# Patient Record
Sex: Male | Born: 1940
Health system: Southern US, Community
[De-identification: ages and names within clinical notes are randomized; demographics above are authoritative.]

## PROBLEM LIST (undated history)

## (undated) DIAGNOSIS — Z973 Presence of spectacles and contact lenses: Secondary | ICD-10-CM

## (undated) DIAGNOSIS — IMO0001 Reserved for inherently not codable concepts without codable children: Secondary | ICD-10-CM

## (undated) DIAGNOSIS — E785 Hyperlipidemia, unspecified: Secondary | ICD-10-CM

## (undated) DIAGNOSIS — Z972 Presence of dental prosthetic device (complete) (partial): Secondary | ICD-10-CM

## (undated) DIAGNOSIS — N35919 Unspecified urethral stricture, male, unspecified site: Secondary | ICD-10-CM

## (undated) DIAGNOSIS — K219 Gastro-esophageal reflux disease without esophagitis: Secondary | ICD-10-CM

## (undated) DIAGNOSIS — I4510 Unspecified right bundle-branch block: Secondary | ICD-10-CM

## (undated) DIAGNOSIS — R399 Unspecified symptoms and signs involving the genitourinary system: Secondary | ICD-10-CM

## (undated) DIAGNOSIS — I252 Old myocardial infarction: Secondary | ICD-10-CM

## (undated) DIAGNOSIS — Z951 Presence of aortocoronary bypass graft: Secondary | ICD-10-CM

## (undated) DIAGNOSIS — N400 Enlarged prostate without lower urinary tract symptoms: Secondary | ICD-10-CM

## (undated) DIAGNOSIS — I259 Chronic ischemic heart disease, unspecified: Secondary | ICD-10-CM

## (undated) DIAGNOSIS — I10 Essential (primary) hypertension: Secondary | ICD-10-CM

## (undated) DIAGNOSIS — Z8679 Personal history of other diseases of the circulatory system: Secondary | ICD-10-CM

## (undated) DIAGNOSIS — I2510 Atherosclerotic heart disease of native coronary artery without angina pectoris: Secondary | ICD-10-CM

## (undated) DIAGNOSIS — E039 Hypothyroidism, unspecified: Secondary | ICD-10-CM

## (undated) HISTORY — DX: Atherosclerotic heart disease of native coronary artery without angina pectoris: I25.10

## (undated) HISTORY — DX: Hypothyroidism, unspecified: E03.9

---

## 2012-06-19 HISTORY — PX: TRANSURETHRAL RESECTION OF PROSTATE: SHX73

## 2012-08-15 DIAGNOSIS — R7989 Other specified abnormal findings of blood chemistry: Secondary | ICD-10-CM

## 2012-08-15 DIAGNOSIS — I509 Heart failure, unspecified: Secondary | ICD-10-CM

## 2012-08-15 DIAGNOSIS — I214 Non-ST elevation (NSTEMI) myocardial infarction: Secondary | ICD-10-CM

## 2012-08-15 DIAGNOSIS — I219 Acute myocardial infarction, unspecified: Secondary | ICD-10-CM

## 2012-08-16 ENCOUNTER — Other Ambulatory Visit: Payer: Self-pay

## 2012-08-16 ENCOUNTER — Other Ambulatory Visit: Payer: Self-pay | Admitting: Physician Assistant

## 2012-08-16 ENCOUNTER — Inpatient Hospital Stay (HOSPITAL_COMMUNITY)
Admission: AD | Admit: 2012-08-16 | Discharge: 2012-08-25 | DRG: 234 | Disposition: A | Discharge status: Skilled Nursing Facility | Payer: Medicare PPO | Source: Other Acute Inpatient Hospital | Attending: Thoracic Surgery (Cardiothoracic Vascular Surgery) | Admitting: Thoracic Surgery (Cardiothoracic Vascular Surgery)

## 2012-08-16 ENCOUNTER — Inpatient Hospital Stay (HOSPITAL_COMMUNITY): Payer: Medicare PPO

## 2012-08-16 ENCOUNTER — Encounter (HOSPITAL_COMMUNITY): Payer: Self-pay

## 2012-08-16 ENCOUNTER — Encounter (HOSPITAL_COMMUNITY)
Admission: AD | Disposition: A | Discharge status: Skilled Nursing Facility | Payer: Self-pay | Source: Other Acute Inpatient Hospital | Attending: Thoracic Surgery (Cardiothoracic Vascular Surgery)

## 2012-08-16 DIAGNOSIS — I10 Essential (primary) hypertension: Secondary | ICD-10-CM | POA: Diagnosis present

## 2012-08-16 DIAGNOSIS — E8779 Other fluid overload: Secondary | ICD-10-CM | POA: Diagnosis not present

## 2012-08-16 DIAGNOSIS — I509 Heart failure, unspecified: Secondary | ICD-10-CM

## 2012-08-16 DIAGNOSIS — D62 Acute posthemorrhagic anemia: Secondary | ICD-10-CM | POA: Diagnosis not present

## 2012-08-16 DIAGNOSIS — I214 Non-ST elevation (NSTEMI) myocardial infarction: Secondary | ICD-10-CM

## 2012-08-16 DIAGNOSIS — R066 Hiccough: Secondary | ICD-10-CM | POA: Diagnosis not present

## 2012-08-16 DIAGNOSIS — I519 Heart disease, unspecified: Secondary | ICD-10-CM | POA: Diagnosis not present

## 2012-08-16 DIAGNOSIS — I4891 Unspecified atrial fibrillation: Secondary | ICD-10-CM | POA: Diagnosis not present

## 2012-08-16 DIAGNOSIS — Y832 Surgical operation with anastomosis, bypass or graft as the cause of abnormal reaction of the patient, or of later complication, without mention of misadventure at the time of the procedure: Secondary | ICD-10-CM | POA: Diagnosis not present

## 2012-08-16 DIAGNOSIS — I2510 Atherosclerotic heart disease of native coronary artery without angina pectoris: Secondary | ICD-10-CM | POA: Diagnosis present

## 2012-08-16 DIAGNOSIS — I9589 Other hypotension: Secondary | ICD-10-CM | POA: Diagnosis not present

## 2012-08-16 DIAGNOSIS — I498 Other specified cardiac arrhythmias: Secondary | ICD-10-CM | POA: Diagnosis not present

## 2012-08-16 DIAGNOSIS — Z87891 Personal history of nicotine dependence: Secondary | ICD-10-CM

## 2012-08-16 DIAGNOSIS — I25810 Atherosclerosis of coronary artery bypass graft(s) without angina pectoris: Secondary | ICD-10-CM | POA: Diagnosis present

## 2012-08-16 DIAGNOSIS — E785 Hyperlipidemia, unspecified: Secondary | ICD-10-CM | POA: Diagnosis present

## 2012-08-16 DIAGNOSIS — N320 Bladder-neck obstruction: Secondary | ICD-10-CM | POA: Diagnosis present

## 2012-08-16 DIAGNOSIS — N400 Enlarged prostate without lower urinary tract symptoms: Secondary | ICD-10-CM | POA: Diagnosis present

## 2012-08-16 DIAGNOSIS — N35919 Unspecified urethral stricture, male, unspecified site: Secondary | ICD-10-CM | POA: Diagnosis present

## 2012-08-16 HISTORY — DX: Benign prostatic hyperplasia without lower urinary tract symptoms: N40.0

## 2012-08-16 HISTORY — PX: LEFT HEART CATHETERIZATION WITH CORONARY ANGIOGRAM: SHX5451

## 2012-08-16 HISTORY — DX: Hyperlipidemia, unspecified: E78.5

## 2012-08-16 HISTORY — DX: Essential (primary) hypertension: I10

## 2012-08-16 LAB — SURGICAL PCR SCREEN
MRSA, PCR: NEGATIVE
Staphylococcus aureus: NEGATIVE

## 2012-08-16 LAB — HEPARIN LEVEL (UNFRACTIONATED): Heparin Unfractionated: 0.75 IU/mL — ABNORMAL HIGH (ref 0.30–0.70)

## 2012-08-16 LAB — TYPE AND SCREEN
ABO/RH(D): B POS
Antibody Screen: NEGATIVE

## 2012-08-16 SURGERY — LEFT HEART CATHETERIZATION WITH CORONARY ANGIOGRAM
Anesthesia: LOCAL

## 2012-08-16 MED ORDER — NITROGLYCERIN IN D5W 200-5 MCG/ML-% IV SOLN
2.0000 ug/min | INTRAVENOUS | Status: DC
  Filled 2012-08-16: qty 250

## 2012-08-16 MED ORDER — DEXMEDETOMIDINE HCL IN NACL 400 MCG/100ML IV SOLN
0.1000 ug/kg/h | INTRAVENOUS | Status: AC
  Administered 2012-08-17: 0.3 ug/kg/h via INTRAVENOUS
  Filled 2012-08-16: qty 100

## 2012-08-16 MED ORDER — MAGNESIUM SULFATE 50 % IJ SOLN
40.0000 meq | INTRAMUSCULAR | Status: DC
  Filled 2012-08-16: qty 10

## 2012-08-16 MED ORDER — SODIUM CHLORIDE 0.9 % IJ SOLN: 3.0000 mL | INTRAMUSCULAR | Status: DC | PRN

## 2012-08-16 MED ORDER — DOPAMINE-DEXTROSE 3.2-5 MG/ML-% IV SOLN
2.0000 ug/kg/min | INTRAVENOUS | Status: AC
  Administered 2012-08-17: 3 ug/kg/min via INTRAVENOUS
  Filled 2012-08-16: qty 250

## 2012-08-16 MED ORDER — TAMSULOSIN HCL 0.4 MG PO CAPS: 0.4000 mg | ORAL_CAPSULE | Freq: Every morning | ORAL | Status: DC

## 2012-08-16 MED ORDER — ATORVASTATIN CALCIUM 80 MG PO TABS
80.0000 mg | ORAL_TABLET | Freq: Every day | ORAL | Status: DC
  Administered 2012-08-16: 80 mg via ORAL
  Filled 2012-08-16 (×3): qty 1

## 2012-08-16 MED ORDER — LIDOCAINE HCL (PF) 1 % IJ SOLN
INTRAMUSCULAR | Status: AC
  Filled 2012-08-16: qty 30

## 2012-08-16 MED ORDER — NITROGLYCERIN IN D5W 200-5 MCG/ML-% IV SOLN
INTRAVENOUS | Status: AC
  Filled 2012-08-16: qty 250

## 2012-08-16 MED ORDER — OXYCODONE-ACETAMINOPHEN 5-325 MG PO TABS: 1.0000 | ORAL_TABLET | ORAL | Status: DC | PRN

## 2012-08-16 MED ORDER — NEBIVOLOL HCL 5 MG PO TABS
5.0000 mg | ORAL_TABLET | Freq: Every day | ORAL | Status: DC
  Filled 2012-08-16: qty 1

## 2012-08-16 MED ORDER — ASPIRIN EC 81 MG PO TBEC
81.0000 mg | DELAYED_RELEASE_TABLET | Freq: Once | ORAL | Status: DC
  Filled 2012-08-16: qty 1

## 2012-08-16 MED ORDER — NITROGLYCERIN IN D5W 200-5 MCG/ML-% IV SOLN
2.0000 ug/min | INTRAVENOUS | Status: DC
  Administered 2012-08-16: 10 ug/min via INTRAVENOUS

## 2012-08-16 MED ORDER — MIDAZOLAM HCL 2 MG/2ML IJ SOLN
INTRAMUSCULAR | Status: AC
  Filled 2012-08-16: qty 2

## 2012-08-16 MED ORDER — PHENYLEPHRINE HCL 10 MG/ML IJ SOLN
30.0000 ug/min | INTRAVENOUS | Status: AC
  Administered 2012-08-17: 15 ug/min via INTRAVENOUS
  Filled 2012-08-16: qty 2

## 2012-08-16 MED ORDER — EPINEPHRINE HCL 1 MG/ML IJ SOLN
0.5000 ug/min | INTRAVENOUS | Status: DC
  Filled 2012-08-16: qty 4

## 2012-08-16 MED ORDER — HEPARIN SODIUM (PORCINE) 1000 UNIT/ML IJ SOLN
INTRAMUSCULAR | Status: AC
  Filled 2012-08-16: qty 1

## 2012-08-16 MED ORDER — SODIUM CHLORIDE 0.9 % IV SOLN: 250.0000 mL | INTRAVENOUS | Status: DC | PRN

## 2012-08-16 MED ORDER — VANCOMYCIN HCL 10 G IV SOLR
1250.0000 mg | INTRAVENOUS | Status: AC
  Administered 2012-08-17: 1250 mg via INTRAVENOUS
  Filled 2012-08-16: qty 1250

## 2012-08-16 MED ORDER — ASPIRIN 81 MG PO CHEW: 324.0000 mg | CHEWABLE_TABLET | ORAL | Status: DC

## 2012-08-16 MED ORDER — BISACODYL 5 MG PO TBEC: 5.0000 mg | DELAYED_RELEASE_TABLET | Freq: Once | ORAL | Status: DC

## 2012-08-16 MED ORDER — CHLORHEXIDINE GLUCONATE 4 % EX LIQD
60.0000 mL | Freq: Once | CUTANEOUS | Status: AC
  Administered 2012-08-16: 4 via TOPICAL
  Filled 2012-08-16: qty 60

## 2012-08-16 MED ORDER — SODIUM CHLORIDE 0.9 % IV SOLN: INTRAVENOUS | Status: DC

## 2012-08-16 MED ORDER — ONDANSETRON HCL 4 MG/2ML IJ SOLN: 4.0000 mg | Freq: Four times a day (QID) | INTRAMUSCULAR | Status: DC | PRN

## 2012-08-16 MED ORDER — SODIUM CHLORIDE 0.9 % IV SOLN
1.0000 mL/kg/h | INTRAVENOUS | Status: DC
  Administered 2012-08-17: 1 mL/kg/h via INTRAVENOUS

## 2012-08-16 MED ORDER — ACETAMINOPHEN 325 MG PO TABS: 650.0000 mg | ORAL_TABLET | ORAL | Status: DC | PRN

## 2012-08-16 MED ORDER — SODIUM CHLORIDE 0.9 % IJ SOLN: 3.0000 mL | Freq: Two times a day (BID) | INTRAMUSCULAR | Status: DC

## 2012-08-16 MED ORDER — DIAZEPAM 2 MG PO TABS
2.0000 mg | ORAL_TABLET | Freq: Once | ORAL | Status: AC
  Administered 2012-08-17: 2 mg via ORAL
  Filled 2012-08-16: qty 1

## 2012-08-16 MED ORDER — AMLODIPINE BESYLATE 5 MG PO TABS
5.0000 mg | ORAL_TABLET | Freq: Every day | ORAL | Status: DC
  Filled 2012-08-16: qty 1

## 2012-08-16 MED ORDER — DEXTROSE 5 % IV SOLN
750.0000 mg | INTRAVENOUS | Status: DC
  Filled 2012-08-16 (×2): qty 750

## 2012-08-16 MED ORDER — ACETAMINOPHEN 325 MG PO TABS
650.0000 mg | ORAL_TABLET | ORAL | Status: DC | PRN
Start: 2012-08-16 — End: 2012-08-17

## 2012-08-16 MED ORDER — ASPIRIN EC 81 MG PO TBEC
81.0000 mg | DELAYED_RELEASE_TABLET | Freq: Every day | ORAL | Status: DC
  Filled 2012-08-16: qty 1

## 2012-08-16 MED ORDER — ALPRAZOLAM 0.25 MG PO TABS: 0.2500 mg | ORAL_TABLET | ORAL | Status: DC | PRN

## 2012-08-16 MED ORDER — HEPARIN (PORCINE) IN NACL 100-0.45 UNIT/ML-% IJ SOLN: 850.0000 [IU]/h | INTRAMUSCULAR | Status: DC

## 2012-08-16 MED ORDER — VERAPAMIL HCL 2.5 MG/ML IV SOLN
INTRAVENOUS | Status: AC
  Filled 2012-08-16: qty 2

## 2012-08-16 MED ORDER — POTASSIUM CHLORIDE 2 MEQ/ML IV SOLN
80.0000 meq | INTRAVENOUS | Status: DC
  Filled 2012-08-16: qty 40

## 2012-08-16 MED ORDER — AMLODIPINE BESYLATE 10 MG PO TABS
10.0000 mg | ORAL_TABLET | Freq: Every day | ORAL | Status: DC
  Filled 2012-08-16: qty 1

## 2012-08-16 MED ORDER — NITROGLYCERIN 0.4 MG SL SUBL: 0.4000 mg | SUBLINGUAL_TABLET | SUBLINGUAL | Status: DC | PRN

## 2012-08-16 MED ORDER — SODIUM CHLORIDE 0.9 % IV SOLN
INTRAVENOUS | Status: AC
  Administered 2012-08-17: 1 [IU]/h via INTRAVENOUS
  Filled 2012-08-16: qty 1

## 2012-08-16 MED ORDER — PLASMA-LYTE 148 IV SOLN
INTRAVENOUS | Status: AC
  Administered 2012-08-17: 10:00:00
  Filled 2012-08-16: qty 2.5

## 2012-08-16 MED ORDER — DEXTROSE 5 % IV SOLN
1.5000 g | INTRAVENOUS | Status: AC
  Administered 2012-08-17: 1.5 g via INTRAVENOUS
  Administered 2012-08-17: .75 g via INTRAVENOUS
  Filled 2012-08-16: qty 1.5

## 2012-08-16 MED ORDER — SODIUM CHLORIDE 0.9 % IV SOLN
INTRAVENOUS | Status: AC
  Administered 2012-08-17: 69.8 mL/h via INTRAVENOUS
  Administered 2012-08-17: 12:00:00 via INTRAVENOUS
  Filled 2012-08-16: qty 40

## 2012-08-16 MED ORDER — FENTANYL CITRATE 0.05 MG/ML IJ SOLN
INTRAMUSCULAR | Status: AC
  Filled 2012-08-16: qty 2

## 2012-08-16 MED ORDER — ADULT MULTIVITAMIN W/MINERALS CH
1.0000 | ORAL_TABLET | Freq: Every day | ORAL | Status: DC
  Administered 2012-08-18 – 2012-08-23 (×6): 1 via ORAL
  Filled 2012-08-16 (×10): qty 1

## 2012-08-16 MED ORDER — TAMSULOSIN HCL 0.4 MG PO CAPS
0.4000 mg | ORAL_CAPSULE | Freq: Every day | ORAL | Status: DC
  Administered 2012-08-18 – 2012-08-25 (×8): 0.4 mg via ORAL
  Filled 2012-08-16 (×12): qty 1

## 2012-08-16 MED ORDER — HEPARIN (PORCINE) IN NACL 100-0.45 UNIT/ML-% IJ SOLN
1000.0000 [IU]/h | INTRAMUSCULAR | Status: DC
  Filled 2012-08-16: qty 250

## 2012-08-16 MED ORDER — CHLORHEXIDINE GLUCONATE 4 % EX LIQD
60.0000 mL | Freq: Once | CUTANEOUS | Status: AC
  Administered 2012-08-17: 4 via TOPICAL
  Filled 2012-08-16: qty 60

## 2012-08-16 MED ORDER — TEMAZEPAM 15 MG PO CAPS: 15.0000 mg | ORAL_CAPSULE | Freq: Once | ORAL | Status: AC | PRN

## 2012-08-16 MED ORDER — ATORVASTATIN CALCIUM 20 MG PO TABS
20.0000 mg | ORAL_TABLET | Freq: Every day | ORAL | Status: DC
  Filled 2012-08-16: qty 1

## 2012-08-16 MED ORDER — FUROSEMIDE 10 MG/ML IJ SOLN: 40.0000 mg | Freq: Every day | INTRAMUSCULAR | Status: DC

## 2012-08-16 MED ORDER — METOPROLOL TARTRATE 12.5 MG HALF TABLET
12.5000 mg | ORAL_TABLET | Freq: Once | ORAL | Status: AC
  Administered 2012-08-17: 12.5 mg via ORAL
  Filled 2012-08-16: qty 1

## 2012-08-16 MED ORDER — HEPARIN (PORCINE) IN NACL 2-0.9 UNIT/ML-% IJ SOLN
INTRAMUSCULAR | Status: AC
  Filled 2012-08-16: qty 1000

## 2012-08-16 MED ORDER — HEPARIN (PORCINE) IN NACL 100-0.45 UNIT/ML-% IJ SOLN
850.0000 [IU]/h | INTRAMUSCULAR | Status: DC
  Filled 2012-08-16: qty 250

## 2012-08-16 NOTE — Progress Notes (Addendum)
ANTICOAGULATION CONSULT NOTE - Follow Up Consult  Pharmacy Consult for Heparin Indication: post-cath for CAD  No Known Allergies  Patient Measurements: Height: 5\' 11"  (180.3 cm) Weight: 179 lb (81.194 kg) IBW/kg (Calculated) : 75.3 Heparin Dosing Weight: 81.2 kg   Vital Signs: Temp: 97.4 F (36.3 C) (04/28 1408) Temp src: Oral (04/28 1408) BP: 121/69 mmHg (04/28 1408) Pulse Rate: 70 (04/28 1735)  Labs:  Recent Labs  08/16/12 1647  HEPARINUNFRC 0.75*    CrCl is unknown because no creatinine reading has been taken.   Assessment: 72 y/o male transferred from [facility] for ACS.  4/28 PM Cath= 1. Critical left main stenosis extending into the ostial LAD and left circumflex  2. Moderately tight RCA stenosis  3. Normal LV function  He will go for urgent coronary bypass surgery tomorrow morning. Plan to restart IV heparin 2 hrs post-cath.  Goal of Therapy:  Heparin level 0.3-0.7 units/ml Monitor platelets by anticoagulation protocol: Yes   Plan:  Resume IV heparin at previous 850 units/hr. Will check heparin level in AM but likely will be discontinued for CABG.  Misty Stanley Stillinger 08/16/2012,7:34 PM

## 2012-08-16 NOTE — CV Procedure (Signed)
   Cardiac Catheterization Procedure Note  Name: GERVIS GABA MRN: [id number] DOB: [birth year]  Procedure: Left Heart Cath, Selective Coronary Angiography, LV angiography, ultrasound guided access of the right radial artery  Indication: NSTEMI. 72 year old gentleman transferred from [facility] hospital to   Procedural Details: The right wrist was prepped, draped, and anesthetized with 1% lidocaine. Using the modified Seldinger technique, a 5 French sheath was introduced into the right radial artery. The radial artery was difficult to access and ultrasound was used for direct visit visualization of needle entry into the artery. 3 mg of verapamil was administered through the sheath, weight-based unfractionated heparin was administered intravenously. Standard Judkins catheters were used for selective coronary angiography and left ventriculography. Catheter exchanges were performed over an exchange length guidewire. There were no immediate procedural complications. A TR band was used for radial hemostasis at the completion of the procedure.  The patient was transferred to the post catheterization recovery area for further monitoring.  Procedural Findings: Hemodynamics: AO 137/64 LV 140/9  Coronary angiography: Coronary dominance: right  Left mainstem: The left main has critical stenosis. The distal left main has 95% stenosis extending into both the LAD and left circumflex.  Left anterior descending (LAD): The LAD is severely diseased vessel. There is heavy calcification present. There is diffuse 70% stenosis through the proximal and mid portions. In the midportion of the LAD, the vessel divides into twin vessels with the larger subbranch running in the diagonal territory. The true LAD is very small with severe diffuse stenosis up to 80%.  Left circumflex (LCx): The left circumflex ostium has 95% stenosis. The mid vessel has diffuse 70% stenosis. There were 2 obtuse marginal branches, both of  which are widely patent.  Right coronary artery (RCA): The RCA has an anterior origin. The mid vessel has diffuse 70% stenosis. The PDA and posterolateral branches are patent. The PDA has no significant stenosis. The posterolateral branch has 50% stenosis.  Left ventriculography: Left ventricular function is within normal limits. The left ventricular ejection fraction is estimated at 55-60%.  Final Conclusions:   1. Critical left main stenosis extending into the ostial LAD and left circumflex 2. Moderately tight RCA stenosis 3. Normal LV function  Recommendations: The patient is not having rest pain, but he does have critical anatomy. I called Dr. Dorris Fetch with cardiac surgery who reviewed his films. The patient will be continued on IV heparin and nitroglycerin. He will go for urgent coronary bypass surgery tomorrow morning. His wife has been notified and she is coming over to the hospital this evening.  Tonny Bollman 08/16/2012, 6:51 PM

## 2012-08-16 NOTE — Progress Notes (Signed)
ANTICOAGULATION CONSULT NOTE - Initial Consult  Pharmacy Consult for Heparin Indication: NSTEMI  No Known Allergies  Patient Measurements: Height: 5\' 11"  (180.3 cm) Weight: 179 lb (81.194 kg) IBW/kg (Calculated) : 75.3 Heparin Dosing Weight: 81.2kg  Vital Signs: Temp: 97.4 F (36.3 C) (04/28 1408) Temp src: Oral (04/28 1408) BP: 121/69 mmHg (04/28 1408) Pulse Rate: 59 (04/28 1408)  Labs:  Recent Labs  08/16/12 1647  HEPARINUNFRC 0.75*    CrCl is unknown because no creatinine reading has been taken.   Medical History: No past medical history on file.  Medications:  Prescriptions prior to admission  Medication Sig Dispense Refill  . amLODipine (NORVASC) 10 MG tablet Take 10 mg by mouth daily.      Marland Kitchen aspirin EC 81 MG tablet Take 81 mg by mouth once.      Marland Kitchen atorvastatin (LIPITOR) 20 MG tablet Take 20 mg by mouth daily.      . Multiple Vitamin (MULTIVITAMIN WITH MINERALS) TABS Take 1 tablet by mouth daily. One a day for men      . nebivolol (BYSTOLIC) 5 MG tablet Take 5 mg by mouth daily.      . tamsulosin (FLOMAX) 0.4 MG CAPS Take 0.4 mg by mouth daily after breakfast.        Assessment: 72yom transferred [facility] on heparin for NSTEMI. Per [facility] records, heparin level (0.78) was supratherapeutic this AM and rate was decreased to 1000 units/hr (~0700). HL still a little elevated this PM  Goal of Therapy:  Heparin level 0.3-0.7 units/ml Monitor platelets by anticoagulation protocol: Yes   Plan:  Decrease heparin to 850 units/hr F/u with 6hr heparin level or cath

## 2012-08-16 NOTE — Progress Notes (Signed)
Heparin protocol:  RN called and said that pt's urine has blood during his void without foley. She called Dr. Antoine Poche and was told not to restart heparin. Since he is having CABG in the morning, will just hold off heparin per MD.

## 2012-08-16 NOTE — Progress Notes (Signed)
Pt observed with bloody urine, Both MD and pharm notified. New orders to discontinue heparin received and implemented. Pt updated, will monitor closely.

## 2012-08-16 NOTE — Progress Notes (Signed)
ANTICOAGULATION CONSULT NOTE - Initial Consult  Pharmacy Consult for Heparin Indication: NSTEMI  No Known Allergies  Patient Measurements: Height: 5\' 11"  (180.3 cm) Weight: 179 lb (81.194 kg) IBW/kg (Calculated) : 75.3 Heparin Dosing Weight: 81.2kg  Vital Signs: Temp: 97.4 F (36.3 C) (04/28 1408) Temp src: Oral (04/28 1408) BP: 121/69 mmHg (04/28 1408)  Labs: No results found for this basename: HGB, HCT, PLT, APTT, LABPROT, INR, HEPARINUNFRC, CREATININE, CKTOTAL, CKMB, TROPONINI,  in the last 72 hours  CrCl is unknown because no creatinine reading has been taken.   Medical History: No past medical history on file.  Medications:  Prescriptions prior to admission  Medication Sig Dispense Refill  . aspirin EC 81 MG tablet Take 81 mg by mouth once.      . Multiple Vitamin (MULTIVITAMIN WITH MINERALS) TABS Take 1 tablet by mouth daily. One a day for men      . nebivolol (BYSTOLIC) 5 MG tablet Take 5 mg by mouth daily.      . tamsulosin (FLOMAX) 0.4 MG CAPS Take 0.4 mg by mouth daily after breakfast.        Assessment: 72yom [facility] for NSTEMI. Per [facility] records, heparin level (0.78) was supratherapeutic this AM and rate was decreased to 1000 units/hr (~0700). Noted plans for cath today if possible - no time yet per RN. - Hg 12.7, Plts 216 - No significant bleeding per patient  Goal of Therapy:  Heparin level 0.3-0.7 units/ml Monitor platelets by anticoagulation protocol: Yes   Plan:  1. Continue heparin drip 1000 units/hr (10 ml/hr) 2. Check heparin level @ 1530 3. Follow-up post cath orders  Cleon Dew [phone] 08/16/2012,2:39 PM

## 2012-08-16 NOTE — H&P (Signed)
71 year old gentleman presenting in transfer from [facility] hospital for cardiac catheterization and possible PCI. He has sustained a non-ST elevation infarction. He has a history of hypertension, hyperlipidemia, and remote tobacco abuse. I have reviewed the full cardiac consultation report dated 08/14/2012 from Dr. Sunny Schlein. He was seen by Dr. Diona Browner this morning and I have reviewed his progress note as well. Risks, indications, and alternatives to cardiac catheterization and PCI were reviewed with the patient who agrees to proceed.  Tonny Bollman 08/16/2012 5:47 PM

## 2012-08-16 NOTE — Consult Note (Signed)
Reason for Consult:left main and 3 vessel CAD Referring Physician: Dr. Cooper  Clayton Austin is an 72 y.o. male.  HPI: 72 yo male presents with a 3 week history of exertional chest pain.  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 He has no prior cardiac history. He started experiencing exertional chest pain about 3 weeks ago. He describes this as a burning sensation in the mid sternal area. It occurs with moderate exertion and is accompanied by shortness of breath. It usually lasts only a couple of minutes and is relieved by rest. He has not had any rest or nocturnal pain. He went to the ED after an episode that was more severe and was admitted with a mildly positive troponin of 0.06. He was transferred to [facility]. This afternoon he had a cardiac catheterization. It revealed severe left main and 3 vessel CAD. He is currently pain free.  Past Medical History  Diagnosis Date  . Hypertension   . Anginal pain   Hyperlipidemia BPH  Past Surgical History  Procedure Laterality Date  . Prostate surgery  06/2012    History reviewed. No pertinent family history. Father CAD- MI at age 65  Social History:  reports that he has quit smoking. His smoking use included Cigarettes. He has a 5 pack-year smoking history. He does not have any smokeless tobacco history on file. He reports that he does not drink alcohol or use illicit drugs.  Allergies: No Known Allergies  Medications:  Prior to Admission:  Prescriptions prior to admission  Medication Sig Dispense Refill  . amLODipine (NORVASC) 10 MG tablet Take 10 mg by mouth daily.      . aspirin EC 81 MG tablet Take 81 mg by mouth once.      . atorvastatin (LIPITOR) 20 MG tablet Take 20 mg by mouth daily.      . Multiple Vitamin (MULTIVITAMIN WITH MINERALS) TABS Take 1 tablet by mouth daily. One a day for men      . nebivolol (BYSTOLIC) 5 MG tablet Take 5 mg by mouth daily.      . tamsulosin  (FLOMAX) 0.4 MG CAPS Take 0.4 mg by mouth daily after breakfast.        Results for orders placed during the hospital encounter of 08/16/12 (from the past 48 hour(s))  HEPARIN LEVEL (UNFRACTIONATED)     Status: Abnormal   Collection Time    08/16/12  4:47 PM      Result Value Range   Heparin Unfractionated 0.75 (*) 0.30 - 0.70 IU/mL   Comment:            IF HEPARIN RESULTS ARE BELOW     EXPECTED VALUES, AND PATIENT     DOSAGE HAS BEEN CONFIRMED,     SUGGEST FOLLOW UP TESTING     OF ANTITHROMBIN III LEVELS.  TYPE AND SCREEN     Status: None   Collection Time    08/16/12  8:03 PM      Result Value Range   ABO/RH(D) B POS     Antibody Screen NEG     Sample Expiration 08/19/2012      Dg Chest Port 1 View  08/16/2012  *RADIOLOGY REPORT*  Clinical Data: Chest pain.  Coronary artery disease.  CABG.  PORTABLE CHEST - 1 VIEW  Comparison: 08/15/2012.  Findings: Low volume chest.  Basilar atelectasis.  Low volumes accentuate the cardiopericardial silhouette. Monitoring leads are projected over the   chest.  Stable bilateral pleural apical thickening.  Mediastinal contours appear within normal limits allowing for lung volumes. No pleural effusion or airspace disease.  IMPRESSION: Low volume chest.   Original Report Authenticated By: Geoffrey Lamke, M.D.     Review of Systems  Constitutional: Positive for malaise/fatigue. Negative for fever and chills.  Respiratory: Positive for shortness of breath.   Cardiovascular: Positive for chest pain.  Genitourinary: Positive for urgency and frequency.       BPH  Neurological: Negative.   Endo/Heme/Allergies: Does not bruise/bleed easily.   Blood pressure 113/61, pulse 57, temperature 97.4 F (36.3 C), temperature source Oral, resp. rate 16, height 5' 11" (1.803 m), weight 168 lb 3.4 oz (76.3 kg), SpO2 100.00%. Physical Exam  Vitals reviewed. Constitutional: He is oriented to person, place, and time. He appears well-developed and well-nourished. No  distress.  HENT:  Head: Normocephalic and atraumatic.  Eyes: EOM are normal. Pupils are equal, round, and reactive to light.  Neck: Neck supple. No thyromegaly present.  No carotid bruits  Cardiovascular: Normal rate, regular rhythm, normal heart sounds and intact distal pulses.  Exam reveals no gallop and no friction rub.   No murmur heard. Respiratory: Effort normal and breath sounds normal. He has no wheezes. He has no rales.  GI: Soft. There is no tenderness.  Musculoskeletal: He exhibits no edema.  Lymphadenopathy:    He has no cervical adenopathy.  Neurological: He is alert and oriented to person, place, and time. No cranial nerve deficit.  Skin: Skin is warm and dry.   CARDIAC CATHETERIZATION  Final Conclusions:  1. Critical left main stenosis extending into the ostial LAD and left circumflex  2. Moderately tight RCA stenosis  3. Normal LV function  Recommendations: The patient is not having rest pain, but he does have critical anatomy. I called Dr. Hendrickson with cardiac surgery who reviewed his films. The patient will be continued on IV heparin and nitroglycerin. He will go for urgent coronary bypass surgery tomorrow morning. His wife has been notified and she is coming over to the hospital this evening.   Assessment/Plan: 72 yo male with multiple CRF- HTN, lipids, former smoker, + family history, who presents with exertional CP. He was found to have left main and 3 vessel CAD. CABG is indicated for survival benefit and relief of symptoms.  I have discussed with the patient the general nature of the procedure, the need for general anesthesia, and the incisions to be used. I discussed the expected hospital stay, overall recovery and short and long term outcomes. He understands the risks include, but are not limited to death, stroke, MI, DVT/PE, bleeding, possible need for transfusion, infections, cardiac arrhythmias, and other organ system dysfunction including respiratory,  renal, or GI complications. He accepts these risks and agrees to proceed.  He is scheduled for 1st case in AM 4/29  HENDRICKSON,STEVEN C 08/16/2012, 9:18 PM      

## 2012-08-17 ENCOUNTER — Encounter (HOSPITAL_COMMUNITY)
Admission: AD | Disposition: A | Discharge status: Skilled Nursing Facility | Payer: Self-pay | Source: Other Acute Inpatient Hospital | Attending: Thoracic Surgery (Cardiothoracic Vascular Surgery)

## 2012-08-17 ENCOUNTER — Inpatient Hospital Stay (HOSPITAL_COMMUNITY): Payer: Medicare PPO

## 2012-08-17 ENCOUNTER — Encounter (HOSPITAL_COMMUNITY): Payer: Self-pay | Admitting: Anesthesiology

## 2012-08-17 ENCOUNTER — Inpatient Hospital Stay (HOSPITAL_COMMUNITY): Payer: Medicare PPO | Admitting: Anesthesiology

## 2012-08-17 DIAGNOSIS — I2510 Atherosclerotic heart disease of native coronary artery without angina pectoris: Secondary | ICD-10-CM

## 2012-08-17 HISTORY — PX: CYSTOSCOPY: SHX5120

## 2012-08-17 HISTORY — PX: CORONARY ARTERY BYPASS GRAFT: SHX141

## 2012-08-17 LAB — CBC
HCT: 28.3 % — ABNORMAL LOW (ref 39.0–52.0)
Hemoglobin: 11.5 g/dL — ABNORMAL LOW (ref 13.0–17.0)
Hemoglobin: 10.3 g/dL — ABNORMAL LOW (ref 13.0–17.0)
MCH: 30 pg (ref 26.0–34.0)
MCH: 30 pg (ref 26.0–34.0)
MCHC: 35.1 g/dL (ref 30.0–36.0)
MCV: 85.4 fL (ref 78.0–100.0)
MCV: 84.5 fL (ref 78.0–100.0)
Platelets: 203 10*3/uL (ref 150–400)
Platelets: 139 10*3/uL — ABNORMAL LOW (ref 150–400)
RBC: 4.1 MIL/uL — ABNORMAL LOW (ref 4.22–5.81)
RBC: 3.83 MIL/uL — ABNORMAL LOW (ref 4.22–5.81)
RBC: 3.35 MIL/uL — ABNORMAL LOW (ref 4.22–5.81)
RDW: 12.5 % (ref 11.5–15.5)
WBC: 21.7 10*3/uL — ABNORMAL HIGH (ref 4.0–10.5)
WBC: 14.7 10*3/uL — ABNORMAL HIGH (ref 4.0–10.5)

## 2012-08-17 LAB — URINALYSIS, ROUTINE W REFLEX MICROSCOPIC
Glucose, UA: NEGATIVE mg/dL
Ketones, ur: 15 mg/dL — AB
pH: 6 (ref 5.0–8.0)

## 2012-08-17 LAB — BLOOD GAS, ARTERIAL
Acid-Base Excess: 2.3 mmol/L — ABNORMAL HIGH (ref 0.0–2.0)
TCO2: 28.3 mmol/L (ref 0–100)
pCO2 arterial: 45.7 mmHg — ABNORMAL HIGH (ref 35.0–45.0)
pO2, Arterial: 83.2 mmHg (ref 80.0–100.0)

## 2012-08-17 LAB — BASIC METABOLIC PANEL
CO2: 27 mEq/L (ref 19–32)
Calcium: 8.9 mg/dL (ref 8.4–10.5)
Creatinine, Ser: 1.21 mg/dL (ref 0.50–1.35)
GFR calc Af Amer: 67 mL/min — ABNORMAL LOW (ref >90)
GFR calc non Af Amer: 58 mL/min — ABNORMAL LOW (ref >90)
Sodium: 137 mEq/L (ref 135–145)

## 2012-08-17 LAB — APTT: aPTT: 34 seconds (ref 24–37)

## 2012-08-17 LAB — POCT I-STAT 3, ART BLOOD GAS (G3+)
Bicarbonate: 25.4 mEq/L — ABNORMAL HIGH (ref 20.0–24.0)
Bicarbonate: 21.2 mEq/L (ref 20.0–24.0)
Patient temperature: 35.3
TCO2: 27 mmol/L (ref 0–100)
TCO2: 22 mmol/L (ref 0–100)
pCO2 arterial: 30.4 mmHg — ABNORMAL LOW (ref 35.0–45.0)
pH, Arterial: 7.385 (ref 7.350–7.450)
pH, Arterial: 7.444 (ref 7.350–7.450)
pO2, Arterial: 330 mmHg — ABNORMAL HIGH (ref 80.0–100.0)
pO2, Arterial: 88 mmHg (ref 80.0–100.0)

## 2012-08-17 LAB — GLUCOSE, CAPILLARY
Glucose-Capillary: 88 mg/dL (ref 70–99)
Glucose-Capillary: 91 mg/dL (ref 70–99)
Glucose-Capillary: 116 mg/dL — ABNORMAL HIGH (ref 70–99)

## 2012-08-17 LAB — POCT I-STAT 4, (NA,K, GLUC, HGB,HCT)
Glucose, Bld: 92 mg/dL (ref 70–99)
Glucose, Bld: 95 mg/dL (ref 70–99)
Glucose, Bld: 126 mg/dL — ABNORMAL HIGH (ref 70–99)
HCT: 36 % — ABNORMAL LOW (ref 39.0–52.0)
HCT: 26 % — ABNORMAL LOW (ref 39.0–52.0)
Hemoglobin: 8.8 g/dL — ABNORMAL LOW (ref 13.0–17.0)
Potassium: 3.8 mEq/L (ref 3.5–5.1)
Potassium: 4 mEq/L (ref 3.5–5.1)
Potassium: 4.8 mEq/L (ref 3.5–5.1)
Potassium: 3.8 mEq/L (ref 3.5–5.1)
Sodium: 141 mEq/L (ref 135–145)

## 2012-08-17 LAB — POCT I-STAT GLUCOSE: Glucose, Bld: 103 mg/dL — ABNORMAL HIGH (ref 70–99)

## 2012-08-17 LAB — CREATININE, SERUM: GFR calc Af Amer: 81 mL/min — ABNORMAL LOW (ref >90)

## 2012-08-17 LAB — PROTIME-INR
INR: 1.42 (ref 0.00–1.49)
Prothrombin Time: 17 seconds — ABNORMAL HIGH (ref 11.6–15.2)

## 2012-08-17 LAB — URINE MICROSCOPIC-ADD ON

## 2012-08-17 LAB — POCT I-STAT, CHEM 8
Calcium, Ion: 1.17 mmol/L (ref 1.13–1.30)
Chloride: 109 mEq/L (ref 96–112)
Creatinine, Ser: 1.1 mg/dL (ref 0.50–1.35)
Glucose, Bld: 127 mg/dL — ABNORMAL HIGH (ref 70–99)
HCT: 29 % — ABNORMAL LOW (ref 39.0–52.0)

## 2012-08-17 LAB — MAGNESIUM: Magnesium: 3.3 mg/dL — ABNORMAL HIGH (ref 1.5–2.5)

## 2012-08-17 LAB — PLATELET COUNT: Platelets: 144 10*3/uL — ABNORMAL LOW (ref 150–400)

## 2012-08-17 SURGERY — CYSTOSCOPY, FLEXIBLE
Anesthesia: General | Site: Penis | Wound class: Clean Contaminated

## 2012-08-17 MED ORDER — SODIUM CHLORIDE 0.9 % IV SOLN: INTRAVENOUS | Status: DC

## 2012-08-17 MED ORDER — LACTATED RINGERS IV SOLN: INTRAVENOUS | Status: DC

## 2012-08-17 MED ORDER — MORPHINE SULFATE 2 MG/ML IJ SOLN
1.0000 mg | INTRAMUSCULAR | Status: AC | PRN
  Filled 2012-08-17: qty 1

## 2012-08-17 MED ORDER — SODIUM CHLORIDE 0.9 % IV SOLN: 250.0000 mL | INTRAVENOUS | Status: DC

## 2012-08-17 MED ORDER — INSULIN ASPART 100 UNIT/ML ~~LOC~~ SOLN: 0.0000 [IU] | SUBCUTANEOUS | Status: AC

## 2012-08-17 MED ORDER — OXYCODONE HCL 5 MG PO TABS
5.0000 mg | ORAL_TABLET | ORAL | Status: DC | PRN
  Administered 2012-08-18 – 2012-08-24 (×4): 10 mg via ORAL
  Filled 2012-08-17 (×4): qty 2

## 2012-08-17 MED ORDER — ACETAMINOPHEN 160 MG/5ML PO SOLN
975.0000 mg | Freq: Four times a day (QID) | ORAL | Status: AC
  Filled 2012-08-17: qty 40.6

## 2012-08-17 MED ORDER — PANTOPRAZOLE SODIUM 40 MG PO TBEC
40.0000 mg | DELAYED_RELEASE_TABLET | Freq: Every day | ORAL | Status: DC
  Administered 2012-08-19 – 2012-08-25 (×6): 40 mg via ORAL
  Filled 2012-08-17 (×6): qty 1

## 2012-08-17 MED ORDER — ASPIRIN 81 MG PO CHEW: 324.0000 mg | CHEWABLE_TABLET | Freq: Every day | ORAL | Status: DC

## 2012-08-17 MED ORDER — LACTATED RINGERS IV SOLN
INTRAVENOUS | Status: DC | PRN
  Administered 2012-08-17: 07:00:00 via INTRAVENOUS

## 2012-08-17 MED ORDER — POTASSIUM CHLORIDE 10 MEQ/50ML IV SOLN
10.0000 meq | Freq: Once | INTRAVENOUS | Status: AC
  Administered 2012-08-17: 10 meq via INTRAVENOUS

## 2012-08-17 MED ORDER — POTASSIUM CHLORIDE 10 MEQ/50ML IV SOLN
10.0000 meq | INTRAVENOUS | Status: AC
  Administered 2012-08-17 (×3): 10 meq via INTRAVENOUS

## 2012-08-17 MED ORDER — PROTAMINE SULFATE 10 MG/ML IV SOLN
INTRAVENOUS | Status: DC | PRN
  Administered 2012-08-17: 10 mg via INTRAVENOUS
  Administered 2012-08-17: 65 mg via INTRAVENOUS
  Administered 2012-08-17: 75 mg via INTRAVENOUS
  Administered 2012-08-17 (×2): 50 mg via INTRAVENOUS

## 2012-08-17 MED ORDER — MAGNESIUM SULFATE 40 MG/ML IJ SOLN
4.0000 g | Freq: Once | INTRAMUSCULAR | Status: AC
  Administered 2012-08-17: 4 g via INTRAVENOUS
  Filled 2012-08-17: qty 100

## 2012-08-17 MED ORDER — BISACODYL 10 MG RE SUPP
10.0000 mg | Freq: Every day | RECTAL | Status: DC
  Filled 2012-08-17: qty 1

## 2012-08-17 MED ORDER — PROPOFOL 10 MG/ML IV BOLUS
INTRAVENOUS | Status: DC | PRN
  Administered 2012-08-17: 80 mg via INTRAVENOUS

## 2012-08-17 MED ORDER — MIDAZOLAM HCL 5 MG/5ML IJ SOLN
INTRAMUSCULAR | Status: DC | PRN
  Administered 2012-08-17: 3 mg via INTRAVENOUS
  Administered 2012-08-17: 1 mg via INTRAVENOUS
  Administered 2012-08-17 (×2): 2 mg via INTRAVENOUS
  Administered 2012-08-17: 3 mg via INTRAVENOUS
  Administered 2012-08-17: 4 mg via INTRAVENOUS
  Administered 2012-08-17: 3 mg via INTRAVENOUS
  Administered 2012-08-17: 2 mg via INTRAVENOUS

## 2012-08-17 MED ORDER — SODIUM CHLORIDE 0.9 % IJ SOLN: 3.0000 mL | INTRAMUSCULAR | Status: DC | PRN

## 2012-08-17 MED ORDER — SODIUM CHLORIDE 0.9 % IJ SOLN
OROMUCOSAL | Status: DC | PRN
  Administered 2012-08-17 (×3): via TOPICAL

## 2012-08-17 MED ORDER — ALBUMIN HUMAN 5 % IV SOLN
250.0000 mL | INTRAVENOUS | Status: AC | PRN
  Administered 2012-08-17 (×4): 250 mL via INTRAVENOUS
  Filled 2012-08-17: qty 500
  Filled 2012-08-17: qty 250

## 2012-08-17 MED ORDER — MIDAZOLAM HCL 2 MG/2ML IJ SOLN: 2.0000 mg | INTRAMUSCULAR | Status: DC | PRN

## 2012-08-17 MED ORDER — FAMOTIDINE IN NACL 20-0.9 MG/50ML-% IV SOLN
20.0000 mg | Freq: Two times a day (BID) | INTRAVENOUS | Status: AC
  Administered 2012-08-17: 20 mg via INTRAVENOUS

## 2012-08-17 MED ORDER — LACTATED RINGERS IV SOLN: 500.0000 mL | Freq: Once | INTRAVENOUS | Status: AC | PRN

## 2012-08-17 MED ORDER — PHENYLEPHRINE HCL 10 MG/ML IJ SOLN
INTRAMUSCULAR | Status: DC | PRN
  Administered 2012-08-17: 80 ug via INTRAVENOUS

## 2012-08-17 MED ORDER — DOCUSATE SODIUM 100 MG PO CAPS
200.0000 mg | ORAL_CAPSULE | Freq: Every day | ORAL | Status: DC
  Administered 2012-08-18 – 2012-08-23 (×6): 200 mg via ORAL
  Filled 2012-08-17 (×9): qty 2

## 2012-08-17 MED ORDER — GLYCOPYRROLATE 0.2 MG/ML IJ SOLN
INTRAMUSCULAR | Status: DC | PRN
  Administered 2012-08-17: 0.2 mg via INTRAVENOUS

## 2012-08-17 MED ORDER — SODIUM CHLORIDE 0.9 % IV SOLN
INTRAVENOUS | Status: DC
  Filled 2012-08-17: qty 1

## 2012-08-17 MED ORDER — ONDANSETRON HCL 4 MG/2ML IJ SOLN: 4.0000 mg | Freq: Four times a day (QID) | INTRAMUSCULAR | Status: DC | PRN

## 2012-08-17 MED ORDER — FENTANYL CITRATE 0.05 MG/ML IJ SOLN
INTRAMUSCULAR | Status: DC | PRN
  Administered 2012-08-17: 400 ug via INTRAVENOUS
  Administered 2012-08-17 (×2): 100 ug via INTRAVENOUS
  Administered 2012-08-17: 250 ug via INTRAVENOUS
  Administered 2012-08-17: 100 ug via INTRAVENOUS
  Administered 2012-08-17: 250 ug via INTRAVENOUS
  Administered 2012-08-17: 100 ug via INTRAVENOUS
  Administered 2012-08-17: 50 ug via INTRAVENOUS
  Administered 2012-08-17: 150 ug via INTRAVENOUS

## 2012-08-17 MED ORDER — INSULIN REGULAR BOLUS VIA INFUSION
0.0000 [IU] | Freq: Three times a day (TID) | INTRAVENOUS | Status: DC
  Filled 2012-08-17: qty 10

## 2012-08-17 MED ORDER — STERILE WATER FOR IRRIGATION IR SOLN
Status: DC | PRN
  Administered 2012-08-17: 3000 mL

## 2012-08-17 MED ORDER — NITROGLYCERIN IN D5W 200-5 MCG/ML-% IV SOLN: 0.0000 ug/min | INTRAVENOUS | Status: DC

## 2012-08-17 MED ORDER — DEXMEDETOMIDINE HCL IN NACL 200 MCG/50ML IV SOLN
0.4000 ug/kg/h | INTRAVENOUS | Status: DC
  Filled 2012-08-17: qty 50

## 2012-08-17 MED ORDER — ALBUMIN HUMAN 5 % IV SOLN
INTRAVENOUS | Status: DC | PRN
  Administered 2012-08-17: 13:00:00 via INTRAVENOUS

## 2012-08-17 MED ORDER — METOPROLOL TARTRATE 12.5 MG HALF TABLET
12.5000 mg | ORAL_TABLET | Freq: Two times a day (BID) | ORAL | Status: DC
  Filled 2012-08-17 (×3): qty 1

## 2012-08-17 MED ORDER — SODIUM CHLORIDE 0.9 % IV SOLN
1.0000 g/h | INTRAVENOUS | Status: DC
  Filled 2012-08-17: qty 20

## 2012-08-17 MED ORDER — ROCURONIUM BROMIDE 100 MG/10ML IV SOLN
INTRAVENOUS | Status: DC | PRN
  Administered 2012-08-17: 50 mg via INTRAVENOUS

## 2012-08-17 MED ORDER — PHENYLEPHRINE HCL 10 MG/ML IJ SOLN
0.0000 ug/min | INTRAMUSCULAR | Status: DC
  Filled 2012-08-17: qty 2

## 2012-08-17 MED ORDER — 0.9 % SODIUM CHLORIDE (POUR BTL) OPTIME
TOPICAL | Status: DC | PRN
  Administered 2012-08-17: 1000 mL
  Administered 2012-08-17: 6000 mL

## 2012-08-17 MED ORDER — VANCOMYCIN HCL IN DEXTROSE 1-5 GM/200ML-% IV SOLN
1000.0000 mg | Freq: Once | INTRAVENOUS | Status: AC
  Administered 2012-08-17: 1000 mg via INTRAVENOUS
  Filled 2012-08-17: qty 200

## 2012-08-17 MED ORDER — DEXMEDETOMIDINE HCL IN NACL 200 MCG/50ML IV SOLN: 0.1000 ug/kg/h | INTRAVENOUS | Status: DC

## 2012-08-17 MED ORDER — ARTIFICIAL TEARS OP OINT
TOPICAL_OINTMENT | OPHTHALMIC | Status: DC | PRN
  Administered 2012-08-17: 1 via OPHTHALMIC

## 2012-08-17 MED ORDER — ACETAMINOPHEN 500 MG PO TABS
1000.0000 mg | ORAL_TABLET | Freq: Four times a day (QID) | ORAL | Status: AC
  Administered 2012-08-18 – 2012-08-22 (×17): 1000 mg via ORAL
  Filled 2012-08-17 (×22): qty 2

## 2012-08-17 MED ORDER — ASPIRIN EC 325 MG PO TBEC
325.0000 mg | DELAYED_RELEASE_TABLET | Freq: Every day | ORAL | Status: DC
  Administered 2012-08-18 – 2012-08-23 (×6): 325 mg via ORAL
  Filled 2012-08-17 (×9): qty 1

## 2012-08-17 MED ORDER — BISACODYL 5 MG PO TBEC
10.0000 mg | DELAYED_RELEASE_TABLET | Freq: Every day | ORAL | Status: DC
  Administered 2012-08-18 – 2012-08-25 (×5): 10 mg via ORAL
  Filled 2012-08-17 (×5): qty 2

## 2012-08-17 MED ORDER — MORPHINE SULFATE 2 MG/ML IJ SOLN
2.0000 mg | INTRAMUSCULAR | Status: DC | PRN
  Administered 2012-08-17 – 2012-08-18 (×4): 2 mg via INTRAVENOUS
  Filled 2012-08-17 (×3): qty 1

## 2012-08-17 MED ORDER — SODIUM CHLORIDE 0.9 % IJ SOLN
3.0000 mL | Freq: Two times a day (BID) | INTRAMUSCULAR | Status: DC
  Administered 2012-08-18 (×2): 3 mL via INTRAVENOUS

## 2012-08-17 MED ORDER — SODIUM CHLORIDE 0.45 % IV SOLN: INTRAVENOUS | Status: DC

## 2012-08-17 MED ORDER — HEPARIN SODIUM (PORCINE) 1000 UNIT/ML IJ SOLN
INTRAMUSCULAR | Status: DC | PRN
Start: 2012-08-17 — End: 2012-08-17
  Administered 2012-08-17: 2000 [IU] via INTRAVENOUS
  Administered 2012-08-17: 28000 [IU] via INTRAVENOUS

## 2012-08-17 MED ORDER — LACTATED RINGERS IV SOLN
INTRAVENOUS | Status: DC | PRN
  Administered 2012-08-17 (×2): via INTRAVENOUS

## 2012-08-17 MED ORDER — VECURONIUM BROMIDE 10 MG IV SOLR
INTRAVENOUS | Status: DC | PRN
Start: 2012-08-17 — End: 2012-08-17
  Administered 2012-08-17 (×2): 10 mg via INTRAVENOUS

## 2012-08-17 MED ORDER — ACETAMINOPHEN 10 MG/ML IV SOLN
1000.0000 mg | Freq: Once | INTRAVENOUS | Status: AC
  Administered 2012-08-17: 1000 mg via INTRAVENOUS
  Filled 2012-08-17: qty 100

## 2012-08-17 MED ORDER — METOPROLOL TARTRATE 1 MG/ML IV SOLN: 2.5000 mg | INTRAVENOUS | Status: DC | PRN

## 2012-08-17 MED ORDER — METOPROLOL TARTRATE 25 MG/10 ML ORAL SUSPENSION
12.5000 mg | Freq: Two times a day (BID) | ORAL | Status: DC
  Filled 2012-08-17 (×3): qty 5

## 2012-08-17 MED ORDER — METOCLOPRAMIDE HCL 5 MG/ML IJ SOLN
10.0000 mg | Freq: Four times a day (QID) | INTRAMUSCULAR | Status: AC
  Administered 2012-08-18 (×2): 10 mg via INTRAVENOUS
  Filled 2012-08-17 (×4): qty 2

## 2012-08-17 MED ORDER — INSULIN ASPART 100 UNIT/ML ~~LOC~~ SOLN
0.0000 [IU] | SUBCUTANEOUS | Status: DC
  Administered 2012-08-18 (×3): 2 [IU] via SUBCUTANEOUS

## 2012-08-17 MED ORDER — DEXTROSE 5 % IV SOLN
1.5000 g | Freq: Two times a day (BID) | INTRAVENOUS | Status: AC
  Administered 2012-08-17 – 2012-08-19 (×4): 1.5 g via INTRAVENOUS
  Filled 2012-08-17 (×4): qty 1.5

## 2012-08-17 MED ORDER — HEMOSTATIC AGENTS (NO CHARGE) OPTIME
TOPICAL | Status: DC | PRN
  Administered 2012-08-17: 1 via TOPICAL

## 2012-08-17 SURGICAL SUPPLY — 101 items
ATTRACTOMAT 16X20 MAGNETIC DRP (DRAPES) ×3 IMPLANT
BAG DECANTER FOR FLEXI CONT (MISCELLANEOUS) ×3 IMPLANT
BANDAGE ELASTIC 4 VELCRO ST LF (GAUZE/BANDAGES/DRESSINGS) ×3 IMPLANT
BANDAGE ELASTIC 6 VELCRO ST LF (GAUZE/BANDAGES/DRESSINGS) ×3 IMPLANT
BANDAGE GAUZE ELAST BULKY 4 IN (GAUZE/BANDAGES/DRESSINGS) ×3 IMPLANT
BASKET HEART (ORDER IN 25'S) (MISCELLANEOUS) ×1
BASKET HEART (ORDER IN 25S) (MISCELLANEOUS) ×2 IMPLANT
BLADE STERNUM SYSTEM 6 (BLADE) ×3 IMPLANT
BLADE SURG 11 STRL SS (BLADE) ×3 IMPLANT
CANISTER SUCTION 2500CC (MISCELLANEOUS) ×3 IMPLANT
CANNULA SOFTFLOW AORTIC 7M21FR (CANNULA) ×3 IMPLANT
CATH COUDE FOLEY 2W 5CC 20FR (CATHETERS) ×3 IMPLANT
CATH COUDE FOLEY 5CC 14FR (CATHETERS) ×6 IMPLANT
CATH CPB KIT HENDRICKSON (MISCELLANEOUS) ×3 IMPLANT
CATH FOLEY 2W COUNCIL 5CC 18FR (CATHETERS) ×3 IMPLANT
CATH ROBINSON RED A/P 18FR (CATHETERS) ×3 IMPLANT
CATH THORACIC 36FR (CATHETERS) ×3 IMPLANT
CATH THORACIC 36FR RT ANG (CATHETERS) ×3 IMPLANT
CLIP TI MEDIUM 24 (CLIP) IMPLANT
CLIP TI WIDE RED SMALL 24 (CLIP) ×9 IMPLANT
CLOTH BEACON ORANGE TIMEOUT ST (SAFETY) ×3 IMPLANT
CONN 3/8X3/8 GISH STERILE (MISCELLANEOUS) ×3 IMPLANT
COVER SURGICAL LIGHT HANDLE (MISCELLANEOUS) ×3 IMPLANT
CRADLE DONUT ADULT HEAD (MISCELLANEOUS) ×3 IMPLANT
DERMABOND ADVANCED (GAUZE/BANDAGES/DRESSINGS) ×1
DERMABOND ADVANCED .7 DNX12 (GAUZE/BANDAGES/DRESSINGS) ×2 IMPLANT
DRAPE CARDIOVASCULAR INCISE (DRAPES) ×1
DRAPE SLUSH/WARMER DISC (DRAPES) ×3 IMPLANT
DRAPE SRG 135X102X78XABS (DRAPES) ×2 IMPLANT
DRSG COVADERM 4X14 (GAUZE/BANDAGES/DRESSINGS) ×3 IMPLANT
ELECT REM PT RETURN 9FT ADLT (ELECTROSURGICAL) ×6
ELECTRODE REM PT RTRN 9FT ADLT (ELECTROSURGICAL) ×4 IMPLANT
GLOVE BIO SURGEON STRL SZ 6.5 (GLOVE) ×27 IMPLANT
GLOVE BIO SURGEON STRL SZ7.5 (GLOVE) ×6 IMPLANT
GLOVE BIO SURGEON STRL SZ8.5 (GLOVE) ×3 IMPLANT
GLOVE BIOGEL M STRL SZ7.5 (GLOVE) ×9 IMPLANT
GLOVE BIOGEL M SZ8.5 STRL (GLOVE) ×9 IMPLANT
GLOVE BIOGEL PI IND STRL 6.5 (GLOVE) ×8 IMPLANT
GLOVE BIOGEL PI IND STRL 7.0 (GLOVE) ×8 IMPLANT
GLOVE BIOGEL PI INDICATOR 6.5 (GLOVE) ×4
GLOVE BIOGEL PI INDICATOR 7.0 (GLOVE) ×4
GLOVE EUDERMIC 7 POWDERFREE (GLOVE) ×15 IMPLANT
GOWN PREVENTION PLUS XLARGE (GOWN DISPOSABLE) ×6 IMPLANT
GOWN STRL NON-REIN LRG LVL3 (GOWN DISPOSABLE) ×24 IMPLANT
GUIDEWIRE STR DUAL SENSOR (WIRE) ×3 IMPLANT
HEMOSTAT POWDER SURGIFOAM 1G (HEMOSTASIS) ×9 IMPLANT
HEMOSTAT SURGICEL 2X14 (HEMOSTASIS) ×3 IMPLANT
INSERT FOGARTY XLG (MISCELLANEOUS) IMPLANT
KIT BASIN OR (CUSTOM PROCEDURE TRAY) ×3 IMPLANT
KIT ROOM TURNOVER OR (KITS) ×3 IMPLANT
KIT SUCTION CATH 14FR (SUCTIONS) ×6 IMPLANT
KIT VASOVIEW W/TROCAR VH 2000 (KITS) ×3 IMPLANT
MARKER GRAFT CORONARY BYPASS (MISCELLANEOUS) ×9 IMPLANT
NS IRRIG 1000ML POUR BTL (IV SOLUTION) ×18 IMPLANT
PACK OPEN HEART (CUSTOM PROCEDURE TRAY) ×3 IMPLANT
PAD ARMBOARD 7.5X6 YLW CONV (MISCELLANEOUS) ×6 IMPLANT
PAD ELECT DEFIB RADIOL ZOLL (MISCELLANEOUS) ×3 IMPLANT
PENCIL BUTTON HOLSTER BLD 10FT (ELECTRODE) ×3 IMPLANT
PUNCH AORTIC ROTATE 4.0MM (MISCELLANEOUS) IMPLANT
PUNCH AORTIC ROTATE 4.5MM 8IN (MISCELLANEOUS) ×3 IMPLANT
PUNCH AORTIC ROTATE 5MM 8IN (MISCELLANEOUS) IMPLANT
SET CARDIOPLEGIA MPS 5001102 (MISCELLANEOUS) ×3 IMPLANT
SET CYSTO W/LG BORE CLAMP LF (SET/KITS/TRAYS/PACK) ×3 IMPLANT
SPONGE GAUZE 4X4 12PLY (GAUZE/BANDAGES/DRESSINGS) ×9 IMPLANT
SUT BONE WAX W31G (SUTURE) ×3 IMPLANT
SUT MNCRL AB 4-0 PS2 18 (SUTURE) ×3 IMPLANT
SUT PROLENE 3 0 SH DA (SUTURE) ×3 IMPLANT
SUT PROLENE 4 0 RB 1 (SUTURE)
SUT PROLENE 4 0 SH DA (SUTURE) IMPLANT
SUT PROLENE 4-0 RB1 .5 CRCL 36 (SUTURE) IMPLANT
SUT PROLENE 6 0 C 1 30 (SUTURE) ×18 IMPLANT
SUT PROLENE 7 0 BV 1 (SUTURE) ×12 IMPLANT
SUT PROLENE 7 0 BV1 MDA (SUTURE) ×3 IMPLANT
SUT PROLENE 8 0 BV175 6 (SUTURE) ×6 IMPLANT
SUT SILK  1 MH (SUTURE)
SUT SILK 1 MH (SUTURE) IMPLANT
SUT STEEL 6MS V (SUTURE) ×3 IMPLANT
SUT STEEL STERNAL CCS#1 18IN (SUTURE) IMPLANT
SUT STEEL SZ 6 DBL 3X14 BALL (SUTURE) ×3 IMPLANT
SUT VIC AB 1 CTX 36 (SUTURE) ×2
SUT VIC AB 1 CTX36XBRD ANBCTR (SUTURE) ×4 IMPLANT
SUT VIC AB 2-0 CT1 27 (SUTURE) ×1
SUT VIC AB 2-0 CT1 TAPERPNT 27 (SUTURE) ×2 IMPLANT
SUT VIC AB 2-0 CTX 27 (SUTURE) IMPLANT
SUT VIC AB 3-0 SH 27 (SUTURE)
SUT VIC AB 3-0 SH 27X BRD (SUTURE) IMPLANT
SUT VIC AB 3-0 X1 27 (SUTURE) IMPLANT
SUT VICRYL 4-0 PS2 18IN ABS (SUTURE) IMPLANT
SUTURE E-PAK OPEN HEART (SUTURE) ×3 IMPLANT
SYR TOOMEY 50ML (SYRINGE) ×3 IMPLANT
SYSTEM SAHARA CHEST DRAIN ATS (WOUND CARE) ×3 IMPLANT
TOWEL OR 17X24 6PK STRL BLUE (TOWEL DISPOSABLE) ×6 IMPLANT
TOWEL OR 17X26 10 PK STRL BLUE (TOWEL DISPOSABLE) ×6 IMPLANT
TRAY FOLEY IC TEMP SENS 14FR (CATHETERS) ×3 IMPLANT
TUBE FEEDING 8FR 16IN STR KANG (MISCELLANEOUS) ×3 IMPLANT
TUBE SUCT INTRACARD DLP 20F (MISCELLANEOUS) ×3 IMPLANT
TUBING INSUFFLATION 10FT LAP (TUBING) ×3 IMPLANT
UNDERPAD 30X30 INCONTINENT (UNDERPADS AND DIAPERS) ×3 IMPLANT
WATER STERILE IRR 1000ML POUR (IV SOLUTION) ×6 IMPLANT
WATER STERILE IRR 3000ML UROMA (IV SOLUTION) ×3 IMPLANT
YANKAUER SUCT BULB TIP NO VENT (SUCTIONS) ×3 IMPLANT

## 2012-08-17 NOTE — Brief Op Note (Addendum)
08/16/2012 - 08/17/2012  11:52 AM  PATIENT:  Clayton Austin  72 y.o. male  PRE-OPERATIVE DIAGNOSIS:  1.S/p NSTEMI 2. Multivessel CAD (included Left Main Disease) 3. Urethral stricture/difficult foley insertion  POST-OPERATIVE DIAGNOSIS: 1.S/p NSTEMI 2. Multivessel CAD (included Left Main Disease) 3. Urethral stricture/difficult foley insertion  PROCEDURE:CYSTOSCOPY,URETHRA DILATATION, FOLEY PLACEMENT by Dr. Kathrynn Running; MEDIAN STERNOTOMY for CORONARY ARTERY BYPASS GRAFTING (CABG) x 5 (LIMA sequentially to LAD and Diagonal, SVG to OM, SVG sequentially to PDA and PLB) with EVH from RIGHT THIGH   SURGEON:  Surgeon(s) and Role: Panel 1:    * Sebastian Ache, MD - Primary  Panel 2:    * Loreli Slot, MD - Primary  PHYSICIAN ASSISTANT: Doree Fudge PA-C   ANESTHESIA:   general  EBL:  Total I/O In: -  Out: 2200 [Urine:2200]   DRAINS: Chest Tube(s) in the Mediastinal and Pleural spaces   COUNTS CORRECT: Yes  DICTATION: .Dragon Dictation  PLAN OF CARE: Admit to inpatient   PATIENT DISPOSITION:  ICU - intubated and hemodynamically stable.   Delay start of Pharmacological VTE agent (>24hrs) due to surgical blood loss or risk of bleeding: yes  PRE OP WEIGHT: 76 kg   XC= 81 min  CPB = 124 min  PDA diffusely diseased, PL and OM good quality, diagonal fair quality, LAD 1 mm diffuse disease poor quality

## 2012-08-17 NOTE — Progress Notes (Signed)
Pt escorted to OR with belongings and family in tow. Pt's necklace x 2, dentures and glasses with wife. Denies pain. Report given to CRNA. Pt's belongings taken  To 2300. Will cont to monitor.

## 2012-08-17 NOTE — Preoperative (Signed)
Beta Blockers   Reason not to administer Beta Blockers:Not Applicable 

## 2012-08-17 NOTE — Consult Note (Signed)
Reason for Consult:Difficult Foley  Referring Physician: Laqueta Carina MD  Clayton Austin is an 72 y.o. male.   HPI:   1 - Difficult Foley / Urethral Stricture - pt with h/o LUTS and prior TURP 06/2012 at outside facility undergoing urgent CABG for critical left main stenosis with difficulty foley. History from chart and family. No piror known stricture disease per report.   Past Medical History  Diagnosis Date  . Hypertension   . Anginal pain   . Hyperlipidemia   . Benign prostatic hypertrophy     Past Surgical History  Procedure Laterality Date  . Prostate surgery  06/2012    Family History  Problem Relation Age of Onset  . Coronary artery disease Father 65    Social History:  reports that he has quit smoking. His smoking use included Cigarettes. He has a 5 pack-year smoking history. He does not have any smokeless tobacco history on file. He reports that he does not drink alcohol or use illicit drugs.  Allergies: No Known Allergies  Medications: I have reviewed the patient's current medications.  Results for orders placed during the hospital encounter of 08/16/12 (from the past 48 hour(s))  HEPARIN LEVEL (UNFRACTIONATED)     Status: Abnormal   Collection Time    08/16/12  4:47 PM      Result Value Range   Heparin Unfractionated 0.75 (*) 0.30 - 0.70 IU/mL   Comment:            IF HEPARIN RESULTS ARE BELOW     EXPECTED VALUES, AND PATIENT     DOSAGE HAS BEEN CONFIRMED,     SUGGEST FOLLOW UP TESTING     OF ANTITHROMBIN III LEVELS.  TYPE AND SCREEN     Status: None   Collection Time    08/16/12  8:03 PM      Result Value Range   ABO/RH(D) B POS     Antibody Screen NEG     Sample Expiration 08/19/2012    ABO/RH     Status: None   Collection Time    08/16/12  8:03 PM      Result Value Range   ABO/RH(D) B POS    SURGICAL PCR SCREEN     Status: None   Collection Time    08/16/12  8:31 PM      Result Value Range   MRSA, PCR NEGATIVE  NEGATIVE   Staphylococcus aureus NEGATIVE  NEGATIVE   Comment:            The Xpert SA Assay (FDA     approved for NASAL specimens     in patients over 78 years of age),     is one component of     a comprehensive surveillance     program.  Test performance has     been validated by [employer] for patients greater     than or equal to 14 year old.     It is not intended     to diagnose infection nor to     guide or monitor treatment.  BLOOD GAS, ARTERIAL     Status: Abnormal   Collection Time    08/17/12  1:48 AM      Result Value Range   FIO2 0.21     pH, Arterial 7.387  7.350 - 7.450   pCO2 arterial 45.7 (*) 35.0 - 45.0 mmHg   pO2, Arterial 83.2  80.0 - 100.0 mmHg  Bicarbonate 26.9 (*) 20.0 - 24.0 mEq/L   TCO2 28.3  0 - 100 mmol/L   Acid-Base Excess 2.3 (*) 0.0 - 2.0 mmol/L   O2 Saturation 96.4     Patient temperature 98.6     Collection site LEFT RADIAL     Drawn by  JESSICA MARSHBURN, RRT     Sample type ARTERIAL DRAW     Allens test (pass/fail) PASS  PASS  URINALYSIS, ROUTINE W REFLEX MICROSCOPIC     Status: Abnormal   Collection Time    08/17/12  4:08 AM      Result Value Range   Color, Urine RED (*) YELLOW   Comment: BIOCHEMICALS MAY BE AFFECTED BY COLOR   APPearance TURBID (*) CLEAR   Specific Gravity, Urine 1.040 (*) 1.005 - 1.030   pH 6.0  5.0 - 8.0   Glucose, UA NEGATIVE  NEGATIVE mg/dL   Hgb urine dipstick LARGE (*) NEGATIVE   Bilirubin Urine MODERATE (*) NEGATIVE   Ketones, ur 15 (*) NEGATIVE mg/dL   Protein, ur 161 (*) NEGATIVE mg/dL   Urobilinogen, UA 1.0  0.0 - 1.0 mg/dL   Nitrite NEGATIVE  NEGATIVE   Leukocytes, UA MODERATE (*) NEGATIVE  URINE MICROSCOPIC-ADD ON     Status: Abnormal   Collection Time    08/17/12  4:08 AM      Result Value Range   Squamous Epithelial / LPF FEW (*) RARE   WBC, UA 0-2  <3 WBC/hpf   RBC / HPF TOO NUMEROUS TO COUNT  <3 RBC/hpf   Bacteria, UA MANY (*) RARE  CBC     Status: Abnormal   Collection Time    08/17/12  5:05 AM       Result Value Range   WBC 11.2 (*) 4.0 - 10.5 K/uL   RBC 4.10 (*) 4.22 - 5.81 MIL/uL   Hemoglobin 12.3 (*) 13.0 - 17.0 g/dL   HCT 09.6 (*) 04.5 - 40.9 %   MCV 85.4  78.0 - 100.0 fL   MCH 30.0  26.0 - 34.0 pg   MCHC 35.1  30.0 - 36.0 g/dL   RDW 81.1  91.4 - 78.2 %   Platelets 203  150 - 400 K/uL  BASIC METABOLIC PANEL     Status: Abnormal   Collection Time    08/17/12  5:05 AM      Result Value Range   Sodium 137  135 - 145 mEq/L   Potassium 4.2  3.5 - 5.1 mEq/L   Chloride 104  96 - 112 mEq/L   CO2 27  19 - 32 mEq/L   Glucose, Bld 83  70 - 99 mg/dL   BUN 18  6 - 23 mg/dL   Creatinine, Ser 9.56  0.50 - 1.35 mg/dL   Calcium 8.9  8.4 - 21.3 mg/dL   GFR calc non Af Amer 58 (*) >90 mL/min   GFR calc Af Amer 67 (*) >90 mL/min   Comment:            The eGFR has been calculated     using the CKD EPI equation.     This calculation has not been     validated in all clinical     situations.     eGFR's persistently     <90 mL/min signify     possible Chronic Kidney Disease.    Dg Chest 2 View  08/17/2012  *RADIOLOGY REPORT*  Clinical Data: Preop heart surgery.  CHEST - 2 VIEW  Comparison: 08/16/2009  Findings: Two views of the chest were obtained.  There is improved aeration of the lungs.  No focal airspace disease or edema. However, there is a 1 cm nodular density in the left upper chest. This is concerning for a pulmonary nodule.  The heart size is within normal limits.  Trachea is midline.  Bony thorax is intact.  IMPRESSION: No acute cardiopulmonary disease.  1 cm nodular density in the left upper chest.  Recommend further evaluation with a chest CT.  These results will be called to the ordering clinician or representative by the Radiologist Assistant, and communication documented in the PACS Dashboard.   Original Report Authenticated By: Richarda Overlie, M.D.    Dg Chest Port 1 View  08/16/2012  *RADIOLOGY REPORT*  Clinical Data: Chest pain.  Coronary artery disease.  CABG.  PORTABLE  CHEST - 1 VIEW  Comparison: 08/15/2012.  Findings: Low volume chest.  Basilar atelectasis.  Low volumes accentuate the cardiopericardial silhouette. Monitoring leads are projected over the chest.  Stable bilateral pleural apical thickening.  Mediastinal contours appear within normal limits allowing for lung volumes. No pleural effusion or airspace disease.  IMPRESSION: Low volume chest.   Original Report Authenticated By: Andreas Newport, M.D.     Review of Systems  Unable to perform ROS: intubated   Blood pressure 140/62, pulse 63, temperature 97.7 F (36.5 C), temperature source Oral, resp. rate 16, height 5\' 11"  (1.803 m), weight 76.3 kg (168 lb 3.4 oz), SpO2 100.00%. Physical Exam  Constitutional: He appears well-developed and well-nourished.  Intubated GCS 3T in OR 14 at [facility]  HENT:  Head: Normocephalic and atraumatic.  Eyes: EOM are normal. Pupils are equal, round, and reactive to light.  Neck: Normal range of motion. Neck supple.  Cardiovascular: Normal rate and regular rhythm.   Respiratory: Effort normal.  GI: Soft. Bowel sounds are normal.  Genitourinary: Penis normal.  Neurological:  GCS 3T  Skin: Skin is warm and dry.    Assessment/Plan:  1 - Difficult Foley / Urethral Stricture - Urgent cysto, urethral dilation and foley placement performed as per separate OP note. Will follow while in house. Call with questions.  Seema Blum 08/17/2012, 8:40 AM

## 2012-08-17 NOTE — Progress Notes (Signed)
Patient ID: CORDAY WYKA, male   DOB: 02-Sep-1940, 72 y.o.   MRN: [id number]                   [address]            [place] [address]          [phone]     Day of Surgery Procedure(s) (LRB): CYSTOSCOPY FLEXIBLE for difficult  foley catheter insertion (N/A) CORONARY ARTERY BYPASS GRAFTING (CABG) (N/A) URETHRA DILATATION (N/A)  Total Length of Stay:  LOS: 1 day  BP 104/63  Pulse 90  Temp(Src) 98.2 F (36.8 C) (Oral)  Resp 13  Ht 5\' 11"  (1.803 m)  Wt 167 lb 8.8 oz (76 kg)  BMI 23.38 kg/m2  SpO2 100%  .Intake/Output     04/29 0701 - 04/30 0700   P.O.    I.V. (mL/kg) 2723.9 (35.8)   Blood 380   NG/GT 30   IV Piggyback 1500   Total Intake(mL/kg) 4633.9 (61)   Urine (mL/kg/hr) 5855 (6.1)   Blood 1625 (1.7)   Chest Tube 250 (0.3)   Total Output 7730   Net -3096.1         . sodium chloride    . sodium chloride 10 mL/hr at 08/17/12 1400  . [START ON 08/18/2012] sodium chloride    . dexmedetomidine 0.3 mcg/kg/hr (08/17/12 1600)  . insulin (NOVOLIN-R) infusion 1 Units/hr (08/17/12 1400)  . lactated ringers 20 mL/hr (08/17/12 1400)  . nitroGLYCERIN Stopped (08/17/12 1400)  . phenylephrine (NEO-SYNEPHRINE) Adult infusion 0.8 mcg/min (08/17/12 1500)     Lab Results  Component Value Date   WBC 21.7* 08/17/2012   HGB 11.5* 08/17/2012   HCT 32.6* 08/17/2012   PLT 139* 08/17/2012   GLUCOSE 111* 08/17/2012   NA 140 08/17/2012   K 3.8 08/17/2012   CL 104 08/17/2012   CREATININE 1.21 08/17/2012   BUN 18 08/17/2012   CO2 27 08/17/2012   INR 1.42 08/17/2012   HGBA1C 5.4 08/17/2012   Good uop Sedated on vent Not bleeding  Delight Ovens MD  Beeper [phone] Office [phone] 08/17/2012 7:38 PM

## 2012-08-17 NOTE — Anesthesia Procedure Notes (Signed)
Procedure Name: Intubation Date/Time: 08/17/2012 7:41 AM Performed by: Gayla Medicus Pre-anesthesia Checklist: Patient identified, Timeout performed, Emergency Drugs available, Suction available and Patient being monitored Patient Re-evaluated:Patient Re-evaluated prior to inductionOxygen Delivery Method: Circle system utilized Preoxygenation: Pre-oxygenation with 100% oxygen Intubation Type: IV induction Ventilation: Mask ventilation without difficulty and Oral airway inserted - appropriate to patient size Laryngoscope Size: Mac and 4 Grade View: Grade II Tube type: Oral Tube size: 8.0 mm Number of attempts: 1 Airway Equipment and Method: Stylet Placement Confirmation: ETT inserted through vocal cords under direct vision,  positive ETCO2 and breath sounds checked- equal and bilateral Secured at: 23 cm Tube secured with: Tape Dental Injury: Teeth and Oropharynx as per pre-operative assessment

## 2012-08-17 NOTE — Anesthesia Preprocedure Evaluation (Signed)
Anesthesia Evaluation  Patient identified by MRN, date of birth, ID band Patient awake    Reviewed: Allergy & Precautions, H&P , NPO status , Patient's Chart, lab work & pertinent test results, reviewed documented beta blocker date and time   Airway Mallampati: II TM Distance: >3 FB Neck ROM: full    Dental   Pulmonary former smoker,  breath sounds clear to auscultation        Cardiovascular hypertension, On Medications and On Home Beta Blockers + angina + CAD and + Past MI Rhythm:regular     Neuro/Psych negative neurological ROS  negative psych ROS   GI/Hepatic negative GI ROS, Neg liver ROS,   Endo/Other  negative endocrine ROS  Renal/GU negative Renal ROS  negative genitourinary   Musculoskeletal   Abdominal   Peds  Hematology negative hematology ROS (+)   Anesthesia Other Findings See surgeon's H&P   Reproductive/Obstetrics negative OB ROS                           Anesthesia Physical Anesthesia Plan  ASA: IV  Anesthesia Plan: General   Post-op Pain Management:    Induction: Intravenous  Airway Management Planned: Oral ETT  Additional Equipment: Arterial line, CVP, PA Cath, TEE and Ultrasound Guidance Line Placement  Intra-op Plan:   Post-operative Plan: Post-operative intubation/ventilation  Informed Consent: I have reviewed the patients History and Physical, chart, labs and discussed the procedure including the risks, benefits and alternatives for the proposed anesthesia with the patient or authorized representative who has indicated his/her understanding and acceptance.   Dental Advisory Given  Plan Discussed with: CRNA and Surgeon  Anesthesia Plan Comments:         Anesthesia Quick Evaluation

## 2012-08-17 NOTE — Progress Notes (Signed)
  Echocardiogram Echocardiogram Transesophageal has been performed.  Clayton Austin 08/17/2012, 9:43 AM

## 2012-08-17 NOTE — OR Nursing (Signed)
Procedure #1 - cysto ended at 0830.

## 2012-08-17 NOTE — Care Management Note (Signed)
    Page 1 of 2   08/25/2012     4:27:15 PM   CARE MANAGEMENT NOTE 08/25/2012  Patient:  Clayton Austin, Clayton Austin   Account Number:  [id number]  Date Initiated:  08/17/2012  Documentation initiated by:  Avie Arenas  Subjective/Objective Assessment:   TX from outside hospital with NSTEMI for cardiac cath. tight LM - CABG on 08-17-12  Has spouse.     Action/Plan:   Anticipated DC Date:  08/23/2012   Anticipated DC Plan:  SKILLED NURSING FACILITY  In-house referral  Clinical Social Worker      DC Planning Services  CM consult      Choice offered to / List presented to:             Status of service:  Completed, signed off Medicare Important Message given?   (If response is "NO", the following Medicare IM given date fields will be blank) Date Medicare IM given:   Date Additional Medicare IM given:    Discharge Disposition:  SKILLED NURSING FACILITY  Per UR Regulation:  Reviewed for med. necessity/level of care/duration of stay  If discussed at Long Length of Stay Meetings, dates discussed:    Comments:  ContactJamail, [place] [phone]                 Gieger,[place] Daughter [phone]  08/25/12 Jaylee Freeze,RN,BSN [phone] PT FOR DC TO [facility] TODAY, PER CSW ARRANGEMENTS.  08/20/12 Charell Faulk,RN,BSN [phone] MET WITH PT TO FINALIZE DC PLANS.  PT IS VERY VAGUE ON WHO WILL PROVIDE CARE AT DC; WOULD LIKE [place] TO PURSUE POSSIBLE SNF, AS HE IS NOT SURE HE CAN ARRANGE 24HR CARE AT DC. WILL CONSULT CSW TO FACILITATE POSS DC TO SNF, PENDING INSURANCE AUTH.  WILL NEED PT/OT EVALS.  08-18-12 2:40pm Avie Arenas, RNBSN [phone]  [phone] Talked with patient who is sitting in chair.  Verified patient lives at home with wife.  Is unsure wether wife will be with him 24/7 on discharge. Adamently refused ST rehab prior to going home in any facility.  Is not able to think of anyone else who may be able to assist.  Agrees to talk to wife about plan for home on discharge.

## 2012-08-17 NOTE — Interval H&P Note (Signed)
History and Physical Interval Note:  08/17/2012 7:19 AM  Clayton Austin  has presented today for surgery, with the diagnosis of Left Main Disease  The various methods of treatment have been discussed with the patient and family. After consideration of risks, benefits and other options for treatment, the patient has consented to  Procedure(s): CORONARY ARTERY BYPASS GRAFTING (CABG) x 4 with EVH (N/A) as a surgical intervention .  The patient's history has been reviewed, patient examined, no change in status, stable for surgery.  I have reviewed the patient's chart and labs.  Questions were answered to the patient's satisfaction.     Waylyn Tenbrink C

## 2012-08-17 NOTE — Brief Op Note (Signed)
08/16/2012 - 08/17/2012  8:35 AM  PATIENT:  Clayton Austin  72 y.o. male  PRE-OPERATIVE DIAGNOSIS:  Left Main Disease, difficult foley insertion  POST-OPERATIVE DIAGNOSIS:  Left Main Disease, difficult foley insertion  PROCEDURE:   SURGEON:  Surgeon(s) and Role: Panel 1:    * Sebastian Ache, MD - Primary    PHYSICIAN ASSISTANT:   ASSISTANTS: none   ANESTHESIA:   general  EBL:     BLOOD ADMINISTERED:none  DRAINS: 75F foley to straight drain   LOCAL MEDICATIONS USED:  NONE  SPECIMEN:  No Specimen  DISPOSITION OF SPECIMEN:  N/A  COUNTS:  YES  TOURNIQUET:  * No tourniquets in log *  DICTATION: .Other Dictation: Dictation Number [id number]  PLAN OF CARE: Admit to inpatient   PATIENT DISPOSITION:  return to Heart Surgery team   Delay start of Pharmacological VTE agent (>24hrs) due to surgical blood loss or risk of bleeding: not applicable

## 2012-08-17 NOTE — Transfer of Care (Signed)
Immediate Anesthesia Transfer of Care Note  Patient: Clayton Austin  Procedure(s) Performed: Procedure(s): CYSTOSCOPY FLEXIBLE for difficult  foley catheter insertion (N/A) CORONARY ARTERY BYPASS GRAFTING (CABG) (N/A) URETHRA DILATATION (N/A)  Patient Location: SICU  Anesthesia Type:General  Level of Consciousness: sedated and Patient remains intubated per anesthesia plan  Airway & Oxygen Therapy: Patient remains intubated per anesthesia plan and Patient placed on Ventilator (see vital sign flow sheet for setting)  Post-op Assessment: Report given to PACU RN and Post -op Vital signs reviewed and stable  Post vital signs: Reviewed and stable  Complications: No apparent anesthesia complications

## 2012-08-17 NOTE — Procedures (Signed)
Extubation Procedure Note  Patient Details:   Name: Clayton Austin DOB: January 15, 1941 MRN: [id number]   Airway Documentation:     Evaluation  O2 sats: stable throughout Complications: No apparent complications Patient did tolerate procedure well. Bilateral Breath Sounds: Clear;Diminished Suctioning: Airway Yes  eXTUBATED TO 4L [place], nif -30 vc 900 PRIOR TO EXTUBATIONS. IS DONE AFTER WITH GOOD EFFORTS. RN WILL CONTINUE TO WORK WITH PATIENT ON THAT.  [name] 08/17/2012, 10:30 PM

## 2012-08-17 NOTE — H&P (View-Only) (Signed)
Reason for Consult:left main and 3 vessel CAD Referring Physician: Dr. [name] is an 72 y.o. male.  HPI: 72 yo male presents with a 3 week history of exertional chest pain.  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 He has no prior cardiac history. He started experiencing exertional chest pain about 3 weeks ago. He describes this as a burning sensation in the mid sternal area. It occurs with moderate exertion and is accompanied by shortness of breath. It usually lasts only a couple of minutes and is relieved by rest. He has not had any rest or nocturnal pain. He went to the ED after an episode that was more severe and was admitted with a mildly positive troponin of 0.06. He was transferred to [facility]. This afternoon he had a cardiac catheterization. It revealed severe left main and 3 vessel CAD. He is currently pain free.  Past Medical History  Diagnosis Date  . Hypertension   . Anginal pain   Hyperlipidemia BPH  Past Surgical History  Procedure Laterality Date  . Prostate surgery  06/2012    History reviewed. No pertinent family history. Father CAD- MI at age 83  Social History:  reports that he has quit smoking. His smoking use included Cigarettes. He has a 5 pack-year smoking history. He does not have any smokeless tobacco history on file. He reports that he does not drink alcohol or use illicit drugs.  Allergies: No Known Allergies  Medications:  Prior to Admission:  Prescriptions prior to admission  Medication Sig Dispense Refill  . amLODipine (NORVASC) 10 MG tablet Take 10 mg by mouth daily.      Marland Kitchen aspirin EC 81 MG tablet Take 81 mg by mouth once.      Marland Kitchen atorvastatin (LIPITOR) 20 MG tablet Take 20 mg by mouth daily.      . Multiple Vitamin (MULTIVITAMIN WITH MINERALS) TABS Take 1 tablet by mouth daily. One a day for men      . nebivolol (BYSTOLIC) 5 MG tablet Take 5 mg by mouth daily.      . tamsulosin  (FLOMAX) 0.4 MG CAPS Take 0.4 mg by mouth daily after breakfast.        Results for orders placed during the hospital encounter of 08/16/12 (from the past 48 hour(s))  HEPARIN LEVEL (UNFRACTIONATED)     Status: Abnormal   Collection Time    08/16/12  4:47 PM      Result Value Range   Heparin Unfractionated 0.75 (*) 0.30 - 0.70 IU/mL   Comment:            IF HEPARIN RESULTS ARE BELOW     EXPECTED VALUES, AND PATIENT     DOSAGE HAS BEEN CONFIRMED,     SUGGEST FOLLOW UP TESTING     OF ANTITHROMBIN III LEVELS.  TYPE AND SCREEN     Status: None   Collection Time    08/16/12  8:03 PM      Result Value Range   ABO/RH(D) B POS     Antibody Screen NEG     Sample Expiration 08/19/2012      Dg Chest Port 1 View  08/16/2012  *RADIOLOGY REPORT*  Clinical Data: Chest pain.  Coronary artery disease.  CABG.  PORTABLE CHEST - 1 VIEW  Comparison: 08/15/2012.  Findings: Low volume chest.  Basilar atelectasis.  Low volumes accentuate the cardiopericardial silhouette. Monitoring leads are projected over the  chest.  Stable bilateral pleural apical thickening.  Mediastinal contours appear within normal limits allowing for lung volumes. No pleural effusion or airspace disease.  IMPRESSION: Low volume chest.   Original Report Authenticated By: Andreas Newport, M.D.     Review of Systems  Constitutional: Positive for malaise/fatigue. Negative for fever and chills.  Respiratory: Positive for shortness of breath.   Cardiovascular: Positive for chest pain.  Genitourinary: Positive for urgency and frequency.       BPH  Neurological: Negative.   Endo/Heme/Allergies: Does not bruise/bleed easily.   Blood pressure 113/61, pulse 57, temperature 97.4 F (36.3 C), temperature source Oral, resp. rate 16, height 5\' 11"  (1.803 m), weight 168 lb 3.4 oz (76.3 kg), SpO2 100.00%. Physical Exam  Vitals reviewed. Constitutional: He is oriented to person, place, and time. He appears well-developed and well-nourished. No  distress.  HENT:  Head: Normocephalic and atraumatic.  Eyes: EOM are normal. Pupils are equal, round, and reactive to light.  Neck: Neck supple. No thyromegaly present.  No carotid bruits  Cardiovascular: Normal rate, regular rhythm, normal heart sounds and intact distal pulses.  Exam reveals no gallop and no friction rub.   No murmur heard. Respiratory: Effort normal and breath sounds normal. He has no wheezes. He has no rales.  GI: Soft. There is no tenderness.  Musculoskeletal: He exhibits no edema.  Lymphadenopathy:    He has no cervical adenopathy.  Neurological: He is alert and oriented to person, place, and time. No cranial nerve deficit.  Skin: Skin is warm and dry.   CARDIAC CATHETERIZATION  Final Conclusions:  1. Critical left main stenosis extending into the ostial LAD and left circumflex  2. Moderately tight RCA stenosis  3. Normal LV function  Recommendations: The patient is not having rest pain, but he does have critical anatomy. I called Dr. Dorris Fetch with cardiac surgery who reviewed his films. The patient will be continued on IV heparin and nitroglycerin. He will go for urgent coronary bypass surgery tomorrow morning. His wife has been notified and she is coming over to the hospital this evening.   Assessment/Plan: 72 yo male with multiple CRF- HTN, lipids, former smoker, + family history, who presents with exertional CP. He was found to have left main and 3 vessel CAD. CABG is indicated for survival benefit and relief of symptoms.  I have discussed with the patient the general nature of the procedure, the need for general anesthesia, and the incisions to be used. I discussed the expected hospital stay, overall recovery and short and long term outcomes. He understands the risks include, but are not limited to death, stroke, MI, DVT/PE, bleeding, possible need for transfusion, infections, cardiac arrhythmias, and other organ system dysfunction including respiratory,  renal, or GI complications. He accepts these risks and agrees to proceed.  He is scheduled for 1st case in AM 4/29  Kregg Cihlar C 08/16/2012, 9:18 PM

## 2012-08-17 NOTE — Anesthesia Postprocedure Evaluation (Signed)
  Anesthesia Post-op Note  Patient: Clayton Austin  Procedure(s) Performed: Procedure(s): CYSTOSCOPY FLEXIBLE for difficult  foley catheter insertion (N/A) CORONARY ARTERY BYPASS GRAFTING (CABG) (N/A) URETHRA DILATATION (N/A)  Patient Location: PACU and SICU  Anesthesia Type:General  Level of Consciousness: sedated  Airway and Oxygen Therapy: Patient remains intubated per anesthesia plan and Patient placed on Ventilator (see vital sign flow sheet for setting)  Post-op Pain: none  Post-op Assessment: Post-op Vital signs reviewed, Patient's Cardiovascular Status Stable, Respiratory Function Stable, Patent Airway, No signs of Nausea or vomiting and Pain level controlled  Post-op Vital Signs: Reviewed and stable  Complications: No apparent anesthesia complications

## 2012-08-18 ENCOUNTER — Inpatient Hospital Stay (HOSPITAL_COMMUNITY): Payer: Medicare PPO

## 2012-08-18 ENCOUNTER — Encounter (HOSPITAL_COMMUNITY): Payer: Self-pay | Admitting: Urology

## 2012-08-18 DIAGNOSIS — I214 Non-ST elevation (NSTEMI) myocardial infarction: Secondary | ICD-10-CM

## 2012-08-18 LAB — POCT I-STAT 3, ART BLOOD GAS (G3+)
Bicarbonate: 21.3 mEq/L (ref 20.0–24.0)
O2 Saturation: 96 %
Patient temperature: 36.5
TCO2: 22 mmol/L (ref 0–100)
pCO2 arterial: 33.4 mmHg — ABNORMAL LOW (ref 35.0–45.0)
pCO2 arterial: 39.2 mmHg (ref 35.0–45.0)
pH, Arterial: 7.389 (ref 7.350–7.450)
pH, Arterial: 7.34 — ABNORMAL LOW (ref 7.350–7.450)
pO2, Arterial: 76 mmHg — ABNORMAL LOW (ref 80.0–100.0)
pO2, Arterial: 77 mmHg — ABNORMAL LOW (ref 80.0–100.0)

## 2012-08-18 LAB — POCT I-STAT, CHEM 8
Creatinine, Ser: 1.1 mg/dL (ref 0.50–1.35)
Hemoglobin: 10.5 g/dL — ABNORMAL LOW (ref 13.0–17.0)
Potassium: 4.1 mEq/L (ref 3.5–5.1)
Sodium: 140 mEq/L (ref 135–145)
TCO2: 22 mmol/L (ref 0–100)

## 2012-08-18 LAB — GLUCOSE, CAPILLARY
Glucose-Capillary: 119 mg/dL — ABNORMAL HIGH (ref 70–99)
Glucose-Capillary: 147 mg/dL — ABNORMAL HIGH (ref 70–99)
Glucose-Capillary: 153 mg/dL — ABNORMAL HIGH (ref 70–99)

## 2012-08-18 LAB — CBC
Hemoglobin: 9.8 g/dL — ABNORMAL LOW (ref 13.0–17.0)
Hemoglobin: 10.3 g/dL — ABNORMAL LOW (ref 13.0–17.0)
MCH: 30.5 pg (ref 26.0–34.0)
MCHC: 35.3 g/dL (ref 30.0–36.0)
MCV: 86.4 fL (ref 78.0–100.0)
Platelets: 123 10*3/uL — ABNORMAL LOW (ref 150–400)
RBC: 3.26 MIL/uL — ABNORMAL LOW (ref 4.22–5.81)
RBC: 3.38 MIL/uL — ABNORMAL LOW (ref 4.22–5.81)
WBC: 13.8 10*3/uL — ABNORMAL HIGH (ref 4.0–10.5)

## 2012-08-18 LAB — BASIC METABOLIC PANEL
CO2: 23 mEq/L (ref 19–32)
GFR calc non Af Amer: 82 mL/min — ABNORMAL LOW (ref >90)
Glucose, Bld: 140 mg/dL — ABNORMAL HIGH (ref 70–99)
Potassium: 3.9 mEq/L (ref 3.5–5.1)
Sodium: 138 mEq/L (ref 135–145)

## 2012-08-18 LAB — MAGNESIUM: Magnesium: 2.6 mg/dL — ABNORMAL HIGH (ref 1.5–2.5)

## 2012-08-18 LAB — CREATININE, SERUM: Creatinine, Ser: 1.04 mg/dL (ref 0.50–1.35)

## 2012-08-18 MED ORDER — ATORVASTATIN CALCIUM 20 MG PO TABS
20.0000 mg | ORAL_TABLET | Freq: Every day | ORAL | Status: DC
  Administered 2012-08-18 – 2012-08-25 (×7): 20 mg via ORAL
  Filled 2012-08-18 (×9): qty 1

## 2012-08-18 MED ORDER — INSULIN DETEMIR 100 UNIT/ML ~~LOC~~ SOLN
25.0000 [IU] | Freq: Every day | SUBCUTANEOUS | Status: DC
  Administered 2012-08-18: 25 [IU] via SUBCUTANEOUS
  Filled 2012-08-18 (×2): qty 0.25

## 2012-08-18 MED ORDER — ENOXAPARIN SODIUM 40 MG/0.4ML ~~LOC~~ SOLN
40.0000 mg | Freq: Every day | SUBCUTANEOUS | Status: DC
  Administered 2012-08-18 – 2012-08-23 (×6): 40 mg via SUBCUTANEOUS
  Filled 2012-08-18 (×9): qty 0.4

## 2012-08-18 MED ORDER — METOPROLOL TARTRATE 25 MG PO TABS
25.0000 mg | ORAL_TABLET | Freq: Two times a day (BID) | ORAL | Status: DC
  Administered 2012-08-18 (×2): 25 mg via ORAL
  Filled 2012-08-18 (×4): qty 1

## 2012-08-18 MED ORDER — POTASSIUM CHLORIDE 10 MEQ/50ML IV SOLN
INTRAVENOUS | Status: AC
  Filled 2012-08-18: qty 200

## 2012-08-18 MED ORDER — METOPROLOL TARTRATE 25 MG/10 ML ORAL SUSPENSION
25.0000 mg | Freq: Two times a day (BID) | ORAL | Status: DC
  Filled 2012-08-18 (×4): qty 10

## 2012-08-18 MED ORDER — FUROSEMIDE 10 MG/ML IJ SOLN
40.0000 mg | Freq: Once | INTRAMUSCULAR | Status: AC
  Administered 2012-08-18: 40 mg via INTRAVENOUS

## 2012-08-18 MED ORDER — POTASSIUM CHLORIDE 10 MEQ/50ML IV SOLN
10.0000 meq | INTRAVENOUS | Status: AC
  Administered 2012-08-18 (×4): 10 meq via INTRAVENOUS

## 2012-08-18 MED ORDER — SODIUM CHLORIDE 0.9 % IV SOLN: INTRAVENOUS | Status: DC

## 2012-08-18 MED ORDER — INSULIN ASPART 100 UNIT/ML ~~LOC~~ SOLN
0.0000 [IU] | SUBCUTANEOUS | Status: DC
  Administered 2012-08-18 – 2012-08-19 (×2): 2 [IU] via SUBCUTANEOUS

## 2012-08-18 MED ORDER — AMLODIPINE BESYLATE 10 MG PO TABS
10.0000 mg | ORAL_TABLET | Freq: Every day | ORAL | Status: DC
  Administered 2012-08-18 – 2012-08-25 (×8): 10 mg via ORAL
  Filled 2012-08-18 (×9): qty 1

## 2012-08-18 MED FILL — Magnesium Sulfate Inj 50%: INTRAMUSCULAR | Qty: 10 | Status: AC

## 2012-08-18 MED FILL — Potassium Chloride Inj 2 mEq/ML: INTRAVENOUS | Qty: 40 | Status: AC

## 2012-08-18 NOTE — Op Note (Signed)
NAME:  [name], [name] NO.:  [id number]  MEDICAL RECORD NO.:  [id number]  LOCATION:  2309                         FACILITY:  [facility]  PHYSICIAN:  Sebastian Ache, MD     DATE OF BIRTH:  12-01-1940  DATE OF PROCEDURE:08/17/2012 DATE OF DISCHARGE:                              OPERATIVE REPORT   DIAGNOSIS:  Difficult Foley urethral stricture.  PROCEDURE:  Emergent cystoscopy with urethral dilation and placement of Foley catheter, complicated.  FINDINGS: 1. Proximal penile stricture approximately 12-French predilation, 18-     [place] post dilation. 2. Unremarkable urinary bladder. 3. Prostatic fossa with history of recent TURP.  COMPLICATIONS:  None.  SPECIMENS:  None.  DRAINS:  An 18-French Councill catheter straight drain.  INDICATION:  Clayton Austin is a 72 year old gentleman with critical left main coronary stenosis, who was undergoing urgent coronary artery bypass grafting today.  He also has history of difficulty urinating and recent transurethral resection of the prostate.  Periprocedurally, nursing team was unable to place Foley catheter needed for critical urine output monitoring following and perioperatively around the time of his CABG. Emergent consultation was sought.  Chart reviewed.  It was agreed that the patient needed urgent Foley catheter placement.  Emergent consent obtained.  PROCEDURE IN DETAIL:  Patient being Clayton Austin verified.  Procedure being catheter placement complicated.  Cystoscopy was performed. Procedure was carried out.  Time-out was performed.  Intravenous antibiotics administered.  A sterile field was created by prepping and draping the patient's penis and perineum using iodine x3.  His foreskin was retracted.  Initial attempt was made at placement of an 18-French coude tip catheter.  There was significant resistance and appeared to be at the level of the penile urethra.  This is the more possible stricture disease as  such.  Flexible cystoscopy was performed using a 16-French flexible cystoscope.  Indeed, a high-grade urethral stricture was encountered in the proximal penile urethra.  This appeared to be approximately 12-French.  Very carefully, this was dilated via the cystoscope to 18-French and this was navigated to the level of the urinary bladder.  There was prostatic fossa changes consistent with recent trans resection of prostate.  A Sensor type wire was advanced to the level of urinary bladder over which a new 18-French Councill catheter was placed.  10 mL sterile water in the balloon.  This irrigated quantitatively.  Efflux of urine was noted to be clear.  Foreskin was retracted back into its anatomic position.  No acute complications occurred.  Procedure was then returned to the cardiac surgery team for the patient's CABG today.          ______________________________ Sebastian Ache, MD     TM/MEDQ  D:  08/17/2012  T:  08/18/2012  Job:  [id number]

## 2012-08-18 NOTE — Progress Notes (Signed)
Patient ID: Clayton Austin, male   DOB: December 09, 1940, 72 y.o.   MRN: [id number]  Hemodynamcally stable. Sinus rhythm.  Urine output good  BMET    Component Value Date/Time   NA 140 08/18/2012 1600   K 4.1 08/18/2012 1600   CL 104 08/18/2012 1600   CO2 23 08/18/2012 0340   GLUCOSE 154* 08/18/2012 1600   BUN 16 08/18/2012 1600   CREATININE 1.04 08/18/2012 1600   CREATININE 1.10 08/18/2012 1600   CALCIUM 8.0* 08/18/2012 0340   GFRNONAA 70* 08/18/2012 1600   GFRAA 81* 08/18/2012 1600    CBC    Component Value Date/Time   WBC 18.9* 08/18/2012 1600   RBC 3.38* 08/18/2012 1600   HGB 10.3* 08/18/2012 1600   HGB 10.5* 08/18/2012 1600   HCT 29.2* 08/18/2012 1600   HCT 31.0* 08/18/2012 1600   PLT 134* 08/18/2012 1600   MCV 86.4 08/18/2012 1600   MCH 30.5 08/18/2012 1600   MCHC 35.3 08/18/2012 1600   RDW 12.8 08/18/2012 1600

## 2012-08-18 NOTE — Progress Notes (Signed)
Mr Allen looks great POD#1 from CABG. Tele reviewed and he is in sinus rhythm. Chest tubes to be removed this am. He is awake and alert. Exam reveals heart to be RRR with expected post-operative rub. Lungs clear anteriorly, no significant edema. Appreciate Dr Hendrickson's care. Will follow. He is on appropriate CV meds with ASA, metoprolol, statin drug.  Tonny Bollman 08/18/2012 9:14 AM

## 2012-08-18 NOTE — Op Note (Signed)
NAMEMarland Austin  LUAN, Austin NO.:  [id number]  MEDICAL RECORD NO.:  [id number]  LOCATION:  2309                         FACILITY:  [facility]  PHYSICIAN:  Salvatore Decent. Dorris Fetch, M.D.DATE OF BIRTH:  06-20-1940  DATE OF PROCEDURE:  08/17/2012 DATE OF DISCHARGE:                              OPERATIVE REPORT   PREOPERATIVE DIAGNOSIS:  Severe left main and three-vessel coronary disease.  POSTOPERATIVE DIAGNOSIS:  Severe left main and three-vessel coronary disease.  PROCEDURE:  Median sternotomy, extracorporeal circulation, coronary artery bypass grafting x5 (sequential left internal mammary artery to first diagonal and LAD, saphenous vein graft to OM-2, sequential saphenous vein graft to posterior descending and posterolateral), endoscopic vein harvest, right leg.  SURGEON:  Salvatore Decent. Dorris Fetch, M.D.  ASSISTANT:  Doree Fudge, PA.  ANESTHESIA:  General.  FINDINGS:  Transesophageal echocardiography revealed mild hypokinesis with no significant valvular pathology.  LAD 1 mm poor quality target, diagonal fair quality target, OM-2 good quality target.  Posterior descending diffusely diseased.  Posterolateral good quality.   CLINICAL NOTE:  [name] is a 72 year old gentleman who presented with a 3-week history of progressive angina.  He underwent cardiac catheterization on August 16, 2012, which revealed critical left main and severe three-vessel coronary disease.  He was advised to undergo coronary artery bypass grafting.  The indications, risks, benefits, and alternatives were discussed in detail with the patient.  He understood and accepted the risks and agreed to proceed.  OPERATIVE NOTE:  [name] was brought to the preoperative holding area on August 17, 2012.  There Anesthesia placed a Swan-Ganz catheter and arterial blood pressure monitoring line.  Intravenous antibiotics were administered.  He was taken to the operating room, anesthetized, and  intubated.  Transesophageal echocardiography was performed.  Please refer to Dr. Leonette Most Frederick's dictated note for full details.  There was no significant valvular pathology and only mild wall motion abnormalities.  A Foley catheter placement was attempted that was unsuccessful.  Dr. Sebastian Ache of Urology was consulted and was able to pass a catheter.  Please refer to his separately dictated note for details.    The chest, abdomen, and legs then were prepped and draped in usual sterile fashion.  A median sternotomy was performed and the left internal mammary artery was harvested using standard technique.  Simultaneously, an incision was made in the medial aspect of the right leg at the level of the knee. The greater saphenous vein was identified and was harvested endoscopically from the upper calf to the groin.  2000 units of heparin was administered during the vessel harvest.  Remainder of the full heparin dose was given prior to opening the pericardium.  After harvesting the conduits, the pericardium was opened.  The ascending aorta was inspected.  There was no palpable atherosclerotic disease. The aorta was cannulated via concentric 2-0 Ethibond pledgeted pursestring sutures.  A dual-stage venous cannula was placed via pursestring suture in the right atrial appendage.  Cardiopulmonary bypass was instituted.  The patient was cooled to 32 degrees Celsius. The coronary arteries were inspected and anastomotic sites were chosen. The LAD and diagonal were both small vessels as they appeared on catheterization.  OM-1 was a relatively small  vessel.  OM-2 was a good- sized vessel.  The posterior descending was much more diffusely diseased than was apparent on catheterization.  The posterolateral was a good quality target.  The conduits were inspected and cut to length.  A foam pad was placed in the pericardium to insulate the heart and protect the left phrenic nerve.  A temperature  probe was placed in the myocardial septum and a cardioplegic cannula was placed in the ascending aorta.  The aorta was crossclamped and left ventricle was emptied via the aortic root vent.  Cardiac arrest then was achieved with combination of cold antegrade blood cardioplegia and topical iced saline.  1 L of cardioplegia was administered.  There was a rapid diastolic arrest and myocardial septal cooling to 10 degrees Celsius.  The following distal anastomoses then were performed.  First, a reversed saphenous vein graft was placed sequentially to the posterior descending and posterolateral branches of the right coronary artery.  The posterior descending was a diffusely diseased vessel, but a 1.5 mm probe did pass distally.  The posterolateral had disease proximal to the anastomosis, but was free of disease distal to the anastomosis.  The vein graft was of good quality.  A side-to-side anastomosis was performed to the posterior descending and end-to-side to the posterolateral, both were done with running 7-0 Prolene sutures.  All anastomoses were probed proximally and distally at their completion to ensure patency.  Cardioplegia was administered at the completion of each vein graft to assess flow and hemostasis, both of which were good.  Next, a reversed saphenous vein graft was placed end-to-side to OM 2. This was the largest of the marginal branches.  It was a 1.5 mm good quality target vessel.  The vein was of good quality, it was anastomosed end-to-side with a running 7 Prolene suture.  Again, there was good flow through this graft and good hemostasis as well.  Next, the left internal mammary artery was brought through a window in the pericardium, the graft was of sufficient length to use as a sequential graft to the diagonal and LAD.  These were both relatively small vessels and using as a sequential would provide additional outflow.  The LAD itself was 1 mm poor quality target.   The diagonal was a 1.5 mm fair quality target.  The mammary was a good quality conduit. A side-to-side anastomosis was performed to the diagonal with a running 8-0 Prolene suture.  The anastomosis was probed proximally and distally.  The distal end of the mammary then was beveled and was anastomosed end-to-side to the LAD with a running 8-0 Prolene suture. At the completion of the 2nd anastomosis, bulldog clamps were briefly removed to inspect for hemostasis.  Septal rewarming was noted.  The bulldog clamp was replaced and the mammary pedicle was tacked to the epicardial surface of the heart with 6-0 Prolene sutures.  Rewarming was begun.  The vein grafts were cut to length.  Additional cardioplegia was administered via the aortic root.  The cardioplegia cannula then was removed and the proximal vein graft anastomoses were performed to 4.5 mm punch aortotomies with running 6-0 Prolene sutures. At the completion of the final proximal anastomosis, the patient was placed in Trendelenburg position.  Lidocaine was administered.  The aortic root was de-aired and aortic crossclamp was removed.  The total crossclamp time was 81 minutes.  The patient required a single defibrillation with 10 joules and then was in sinus rhythm thereafter.  While rewarming was completed, all proximal  and distal anastomoses were inspected for hemostasis.  Epicardial pacing wires were placed on the right ventricle and right atrium.  When the patient had rewarmed to a core temperature of 37 degrees Celsius, he was weaned from cardiopulmonary bypass on the first attempt without difficulty.  The total bypass time was 124 minutes.  His initial cardiac index was less than 2 liters/minute/meter squared.  Low-dose dopamine infusion at 3 mcg/kg/minute was initiated.  Transesophageal echocardiography showed no change in left ventricular function.  A test dose protamine was administered and was well tolerated.  The atrial  and aortic cannulae were removed.  The remainder of the protamine was administered and this was followed by some transient hypotension, which responded to volume administration.  The chest was copiously irrigated with warm saline.  Hemostasis was achieved.  The pericardium was reapproximated with interrupted 3-0 silk sutures.  It came together easily without tension.  Left pleural and mediastinal chest tubes were placed via separate subcostal incisions.  The sternum was closed with a combination of single and double heavy gauge stainless steel wires.  The pectoralis fascia, subcutaneous tissue, and skin were closed in standard fashion.  All sponge, needle, and instrument counts were correct at the end of the procedure.  The patient was taken from the operating room to the Surgical Intensive Care Unit intubated and in good condition.     Salvatore Decent Dorris Fetch, M.D.     SCH/MEDQ  D:  08/17/2012  T:  08/18/2012  Job:  [id number]

## 2012-08-19 ENCOUNTER — Inpatient Hospital Stay (HOSPITAL_COMMUNITY): Payer: Medicare PPO

## 2012-08-19 LAB — GLUCOSE, CAPILLARY
Glucose-Capillary: 100 mg/dL — ABNORMAL HIGH (ref 70–99)
Glucose-Capillary: 116 mg/dL — ABNORMAL HIGH (ref 70–99)
Glucose-Capillary: 117 mg/dL — ABNORMAL HIGH (ref 70–99)
Glucose-Capillary: 121 mg/dL — ABNORMAL HIGH (ref 70–99)

## 2012-08-19 LAB — CBC
HCT: 26.7 % — ABNORMAL LOW (ref 39.0–52.0)
Hemoglobin: 9.4 g/dL — ABNORMAL LOW (ref 13.0–17.0)
MCHC: 35.2 g/dL (ref 30.0–36.0)
RBC: 3.1 MIL/uL — ABNORMAL LOW (ref 4.22–5.81)
WBC: 19.3 10*3/uL — ABNORMAL HIGH (ref 4.0–10.5)

## 2012-08-19 LAB — BASIC METABOLIC PANEL
BUN: 22 mg/dL (ref 6–23)
Chloride: 102 mEq/L (ref 96–112)
GFR calc Af Amer: 77 mL/min — ABNORMAL LOW (ref >90)
GFR calc non Af Amer: 67 mL/min — ABNORMAL LOW (ref >90)
Potassium: 3.7 mEq/L (ref 3.5–5.1)
Sodium: 136 mEq/L (ref 135–145)

## 2012-08-19 MED ORDER — SODIUM CHLORIDE 0.9 % IV SOLN
250.0000 mL | INTRAVENOUS | Status: DC | PRN
  Administered 2012-08-24: 250 mL via INTRAVENOUS

## 2012-08-19 MED ORDER — SODIUM CHLORIDE 0.9 % IJ SOLN
3.0000 mL | Freq: Two times a day (BID) | INTRAMUSCULAR | Status: DC
  Administered 2012-08-20 – 2012-08-24 (×9): 3 mL via INTRAVENOUS

## 2012-08-19 MED ORDER — MAGNESIUM HYDROXIDE 400 MG/5ML PO SUSP: 30.0000 mL | Freq: Every day | ORAL | Status: DC | PRN

## 2012-08-19 MED ORDER — INSULIN ASPART 100 UNIT/ML ~~LOC~~ SOLN: 0.0000 [IU] | SUBCUTANEOUS | Status: DC

## 2012-08-19 MED ORDER — NEBIVOLOL HCL 5 MG PO TABS
5.0000 mg | ORAL_TABLET | Freq: Every day | ORAL | Status: DC
  Administered 2012-08-19: 5 mg via ORAL
  Filled 2012-08-19 (×2): qty 1

## 2012-08-19 MED ORDER — ALPRAZOLAM 0.25 MG PO TABS
0.2500 mg | ORAL_TABLET | Freq: Four times a day (QID) | ORAL | Status: DC | PRN
  Administered 2012-08-22: 0.25 mg via ORAL
  Filled 2012-08-19: qty 1

## 2012-08-19 MED ORDER — ZOLPIDEM TARTRATE 5 MG PO TABS: 5.0000 mg | ORAL_TABLET | Freq: Every evening | ORAL | Status: DC | PRN

## 2012-08-19 MED ORDER — POTASSIUM CHLORIDE 10 MEQ/50ML IV SOLN
10.0000 meq | Freq: Once | INTRAVENOUS | Status: AC
  Administered 2012-08-19: 10 meq via INTRAVENOUS
  Filled 2012-08-19: qty 50

## 2012-08-19 MED ORDER — POTASSIUM CHLORIDE 10 MEQ/50ML IV SOLN
INTRAVENOUS | Status: AC
  Filled 2012-08-19: qty 50

## 2012-08-19 MED ORDER — FUROSEMIDE 10 MG/ML IJ SOLN
40.0000 mg | Freq: Once | INTRAMUSCULAR | Status: AC
  Administered 2012-08-19: 40 mg via INTRAVENOUS
  Filled 2012-08-19: qty 4

## 2012-08-19 MED ORDER — POTASSIUM CHLORIDE 10 MEQ/50ML IV SOLN
10.0000 meq | INTRAVENOUS | Status: AC | PRN
  Administered 2012-08-19 (×3): 10 meq via INTRAVENOUS
  Filled 2012-08-19 (×2): qty 50

## 2012-08-19 MED ORDER — MOVING RIGHT ALONG BOOK
Freq: Once | Status: AC
  Administered 2012-08-19: 13:00:00
  Filled 2012-08-19: qty 1

## 2012-08-19 MED ORDER — ALUM & MAG HYDROXIDE-SIMETH 200-200-20 MG/5ML PO SUSP: 15.0000 mL | ORAL | Status: DC | PRN

## 2012-08-19 MED ORDER — SODIUM CHLORIDE 0.9 % IJ SOLN: 3.0000 mL | INTRAMUSCULAR | Status: DC | PRN

## 2012-08-19 NOTE — Progress Notes (Signed)
2 Days Post-Op Procedure(s) (LRB): CYSTOSCOPY FLEXIBLE for difficult  foley catheter insertion (N/A) CORONARY ARTERY BYPASS GRAFTING (CABG) (N/A) URETHRA DILATATION (N/A) Subjective: Feels well this AM  Objective: Vital signs in last 24 hours: Temp:  [97.3 F (36.3 C)-97.8 F (36.6 C)] 97.6 F (36.4 C) (05/01 0747) Pulse Rate:  [66-95] 81 (05/01 0700) Cardiac Rhythm:  [-] Normal sinus rhythm (05/01 0600) Resp:  [16-24] 19 (05/01 0700) BP: (93-154)/(37-79) 124/69 mmHg (05/01 0700) SpO2:  [90 %-99 %] 99 % (05/01 0700) Weight:  [179 lb 3.7 oz (81.3 kg)] 179 lb 3.7 oz (81.3 kg) (05/01 0400)  Hemodynamic parameters for last 24 hours: PAP: (39)/(18) 39/18 mmHg  Intake/Output from previous day: 04/30 0701 - 05/01 0700 In: 1870 [P.O.:1080; I.V.:390; IV Piggyback:400] Out: 1590 [Urine:1515; Chest Tube:75] Intake/Output this shift:    General appearance: alert and no distress Neurologic: intact Heart: regular rate and rhythm Lungs: diminished breath sounds bibasilar Abdomen: normal findings: soft, non-tender  Lab Results:  Recent Labs  08/18/12 1600 08/19/12 0430  WBC 18.9* 19.3*  HGB 10.3*  10.5* 9.4*  HCT 29.2*  31.0* 26.7*  PLT 134* 124*   BMET:  Recent Labs  08/18/12 0340 08/18/12 1600 08/19/12 0430  NA 138 140 136  K 3.9 4.1 3.7  CL 107 104 102  CO2 23  --  26  GLUCOSE 140* 154* 137*  BUN 12 16 22   CREATININE 0.94 1.04  1.10 1.08  CALCIUM 8.0*  --  8.9    PT/INR:  Recent Labs  08/17/12 1346  LABPROT 17.0*  INR 1.42   ABG    Component Value Date/Time   PHART 7.340* 08/17/2012 2303   HCO3 21.3 08/17/2012 2303   TCO2 22 08/18/2012 1600   ACIDBASEDEF 4.0* 08/17/2012 2303   O2SAT 95.0 08/17/2012 2303   CBG (last 3)   Recent Labs  08/18/12 2034 08/18/12 2354 08/19/12 0358  GLUCAP 147* 121* 100*    Assessment/Plan: S/P Procedure(s) (LRB): CYSTOSCOPY FLEXIBLE for difficult  foley catheter insertion (N/A) CORONARY ARTERY BYPASS GRAFTING  (CABG) (N/A) URETHRA DILATATION (N/A) Plan for transfer to step-down: see transfer orders CV- stable- resume bystolic, continue lipitor  RENAL- still volume overloaded- IV lasix this AM  RESP- pulmonary hygiene  CBG well controlled- change to AC/ HS  Continue ambulation   LOS: 3 days    Tyashia Morrisette C 08/19/2012

## 2012-08-19 NOTE — Progress Notes (Signed)
2 Days Post-Op  Subjective:  1 - Difficult Foley / Urethral Stricture - pt with h/o LUTS and prior TURP 06/2012 by [facility] in [place]. Had cysto with  urethral dilation for proximal penile stricutre / bladder neck contracture 4/29 at time of his urgent CABG for critical left main stenosis.  Today Clayton Austin is w/o complaints. He is now on the floor, recovering from his CABG. No foley problems. UOP excellent.   Objective: Vital signs in last 24 hours: Temp:  [97.3 F (36.3 C)-97.8 F (36.6 C)] 97.8 F (36.6 C) (05/01 1159) Pulse Rate:  [67-95] 79 (05/01 1159) Resp:  [16-24] 19 (05/01 1159) BP: (93-153)/(37-78) 111/71 mmHg (05/01 1159) SpO2:  [90 %-99 %] 93 % (05/01 1159) Weight:  [81.3 kg (179 lb 3.7 oz)] 81.3 kg (179 lb 3.7 oz) (05/01 0400) Last BM Date: 08/16/12  Intake/Output from previous day: 04/30 0701 - 05/01 0700 In: 1870 [P.O.:1080; I.V.:390; IV Piggyback:400] Out: 1590 [Urine:1515; Chest Tube:75] Intake/Output this shift: Total I/O In: 830 [P.O.:720; I.V.:10; IV Piggyback:100] Out: 1420 [Urine:1420]  General appearance: alert, cooperative and appears stated age Head: Normocephalic, without obvious abnormality, atraumatic Eyes: conjunctivae/corneas clear. PERRL, EOM's intact. Fundi benign. Ears: normal TM's and external ear canals both ears Nose: Nares normal. Septum midline. Mucosa normal. No drainage or sinus tenderness. Throat: lips, mucosa, and tongue normal; teeth and gums normal Neck: no adenopathy, no carotid bruit, no JVD, supple, symmetrical, trachea midline and thyroid not enlarged, symmetric, no tenderness/mass/nodules Back: symmetric, no curvature. ROM normal. No CVA tenderness. Resp: clear to auscultation bilaterally Chest wall: no tenderness, median sternotomy site noted Cardio: regular rate and rhythm, S1, S2 normal, no murmur, click, rub or gallop GI: soft, non-tender; bowel sounds normal; no masses,  no organomegaly Male genitalia: normal, Foley c/d/i  with clear urine in collection bag.  Extremities: extremities normal, atraumatic, no cyanosis or edema Pulses: 2+ and symmetric Skin: Skin color, texture, turgor normal. No rashes or lesions Lymph nodes: Cervical, supraclavicular, and axillary nodes normal. Neurologic: Grossly normal  Lab Results:   Recent Labs  08/18/12 1600 08/19/12 0430  WBC 18.9* 19.3*  HGB 10.3*  10.5* 9.4*  HCT 29.2*  31.0* 26.7*  PLT 134* 124*   BMET  Recent Labs  08/18/12 0340 08/18/12 1600 08/19/12 0430  NA 138 140 136  K 3.9 4.1 3.7  CL 107 104 102  CO2 23  --  26  GLUCOSE 140* 154* 137*  BUN 12 16 22   CREATININE 0.94 1.04  1.10 1.08  CALCIUM 8.0*  --  8.9   PT/INR  Recent Labs  08/17/12 1346  LABPROT 17.0*  INR 1.42   ABG  Recent Labs  08/17/12 2203 08/17/12 2303  PHART 7.389 7.340*  HCO3 20.3 21.3    Studies/Results: Dg Chest Port 1 View  08/19/2012  *RADIOLOGY REPORT*  Clinical Data: Status post cardiac surgery  PORTABLE CHEST - 1 VIEW  Comparison: August 18, 2012.  Findings: Stable mild cardiomegaly.  Sternotomy wires are noted. Swan-Ganz catheter has been removed.  Left-sided chest tube has been removed without pneumothorax.  Left basilar subsegmental atelectasis remains with associated pleural effusion.  Right lung is clear.  IMPRESSION: Swan-Ganz catheter and left-sided chest tube have been removed.  No pneumothorax is seen.  Left basilar subsegmental atelectasis with associated pleural effusion remains.   Original Report Authenticated By: Lupita Raider.,  M.D.    Dg Chest Portable 1 View In Am  08/18/2012  *RADIOLOGY REPORT*  Clinical Data: Postoperative evaluation; status  post extubation  PORTABLE CHEST - 1 VIEW  Comparison:  August 17, 2012  Findings: Endotracheal tube has been removed.  Left chest tube and mediastinal drain remain in place.  Central catheter tip is in the main pulmonary outflow tract.  No pneumothorax.  The lungs are clear.  Heart is mildly prominent in  size with normal pulmonary vascularity.  No adenopathy.  The patient is status post coronary bypass grafting.  IMPRESSION: Lungs clear.  No pneumothorax.   Original Report Authenticated By: Bretta Bang, M.D.     Anti-infectives: Anti-infectives   Start     Dose/Rate Route Frequency Ordered Stop   08/17/12 2000  vancomycin (VANCOCIN) IVPB 1000 mg/200 mL premix     1,000 mg 200 mL/hr over 60 Minutes Intravenous  Once 08/17/12 1334 08/17/12 2057   08/17/12 1600  cefUROXime (ZINACEF) 1.5 g in dextrose 5 % 50 mL IVPB     1.5 g 100 mL/hr over 30 Minutes Intravenous Every 12 hours 08/17/12 1334 08/19/12 0430   08/17/12 0400  vancomycin (VANCOCIN) 1,250 mg in sodium chloride 0.9 % 250 mL IVPB     1,250 mg 166.7 mL/hr over 90 Minutes Intravenous To Surgery 08/16/12 1929 08/17/12 0730   08/17/12 0400  cefUROXime (ZINACEF) 1.5 g in dextrose 5 % 50 mL IVPB     1.5 g 100 mL/hr over 30 Minutes Intravenous To Surgery 08/16/12 1929 08/17/12 1237   08/17/12 0400  cefUROXime (ZINACEF) 750 mg in dextrose 5 % 50 mL IVPB  Status:  Discontinued     750 mg 100 mL/hr over 30 Minutes Intravenous To Surgery 08/16/12 1928 08/17/12 1255      Assessment/Plan:  1 - Difficult Foley / Urethral Stricture - Doing well from urologic perspective. Would DC with current catheter in place with trial of void as outpatient at [facility] office. I will arrange this for about 1-2 weeks from now.   Will sign off, please call with any questions.   [facility], Kelsay Haggard 08/19/2012

## 2012-08-19 NOTE — Progress Notes (Signed)
Spoke with Dr. Berneice Heinrich re: Mr. Trapani's foley. Received TVO to keep in foley until patient is seen after discharge in Dr. Emmaline Life office. Per Dr. Berneice Heinrich. He will write orders this afternoon supporting this when he rounds on patient in hospital.  Felipa Emory

## 2012-08-19 NOTE — Progress Notes (Signed)
Pt t/x to unit 2000 in wheelchair on monitor without event. Pt's family present upon t/x. Report called to [facility] and he was present for arrival and tele hookup. No questions pending during bedside report  Felipa Emory

## 2012-08-19 NOTE — Progress Notes (Signed)
Potassium 3.7.  TCTS protocol initiated

## 2012-08-19 NOTE — Progress Notes (Signed)
CARDIAC REHAB PHASE I   PRE:  Rate/Rhythm: 94 SR    BP: sitting 166/72    SaO2: 92 RA  MODE:  Ambulation: 100 ft   POST:  Rate/Rhythm: 108     BP: sitting 160/86     SaO2: 86-90 while walking, 93 1L in bed  Pt weak. C/o dizziness (BP high). Not motivated today (first walk). SaO2 low walking with some SOB. To bed after walk. Will f/u. Encouraged more walking today and IS. [id number]  Elissa Lovett Milburn CES, ACSM 08/19/2012 3:17 PM

## 2012-08-20 ENCOUNTER — Inpatient Hospital Stay (HOSPITAL_COMMUNITY): Payer: Medicare PPO

## 2012-08-20 LAB — CBC
HCT: 29.1 % — ABNORMAL LOW (ref 39.0–52.0)
Hemoglobin: 10.3 g/dL — ABNORMAL LOW (ref 13.0–17.0)
MCHC: 35.4 g/dL (ref 30.0–36.0)
RBC: 3.38 MIL/uL — ABNORMAL LOW (ref 4.22–5.81)

## 2012-08-20 LAB — BASIC METABOLIC PANEL
BUN: 21 mg/dL (ref 6–23)
CO2: 27 mEq/L (ref 19–32)
Chloride: 98 mEq/L (ref 96–112)
GFR calc non Af Amer: 82 mL/min — ABNORMAL LOW (ref >90)
Glucose, Bld: 112 mg/dL — ABNORMAL HIGH (ref 70–99)
Potassium: 4 mEq/L (ref 3.5–5.1)
Sodium: 136 mEq/L (ref 135–145)

## 2012-08-20 LAB — GLUCOSE, CAPILLARY
Glucose-Capillary: 104 mg/dL — ABNORMAL HIGH (ref 70–99)
Glucose-Capillary: 107 mg/dL — ABNORMAL HIGH (ref 70–99)

## 2012-08-20 MED ORDER — METOCLOPRAMIDE HCL 10 MG PO TABS
10.0000 mg | ORAL_TABLET | Freq: Three times a day (TID) | ORAL | Status: AC
  Administered 2012-08-20 – 2012-08-22 (×8): 10 mg via ORAL
  Filled 2012-08-20 (×12): qty 1

## 2012-08-20 MED ORDER — POTASSIUM CHLORIDE CRYS ER 20 MEQ PO TBCR
20.0000 meq | EXTENDED_RELEASE_TABLET | Freq: Two times a day (BID) | ORAL | Status: DC
  Administered 2012-08-20 – 2012-08-25 (×9): 20 meq via ORAL
  Filled 2012-08-20 (×14): qty 1

## 2012-08-20 MED ORDER — SODIUM CHLORIDE 0.9 % IV SOLN
12.5000 mg | Freq: Three times a day (TID) | INTRAVENOUS | Status: DC | PRN
  Administered 2012-08-21 – 2012-08-25 (×6): 12.5 mg via INTRAVENOUS
  Filled 2012-08-20 (×10): qty 0.5

## 2012-08-20 MED ORDER — FUROSEMIDE 40 MG PO TABS
40.0000 mg | ORAL_TABLET | Freq: Every day | ORAL | Status: DC
  Administered 2012-08-20 – 2012-08-25 (×5): 40 mg via ORAL
  Filled 2012-08-20 (×7): qty 1

## 2012-08-20 MED ORDER — INSULIN ASPART 100 UNIT/ML ~~LOC~~ SOLN
0.0000 [IU] | Freq: Three times a day (TID) | SUBCUTANEOUS | Status: DC
  Administered 2012-08-24: 2 [IU] via SUBCUTANEOUS

## 2012-08-20 MED ORDER — NEBIVOLOL HCL 5 MG PO TABS
5.0000 mg | ORAL_TABLET | Freq: Two times a day (BID) | ORAL | Status: DC
  Administered 2012-08-20 – 2012-08-25 (×11): 5 mg via ORAL
  Filled 2012-08-20 (×13): qty 1

## 2012-08-20 NOTE — Evaluation (Signed)
Physical Therapy Evaluation Patient Details Name: Clayton Austin MRN: [id number] DOB: Jul 05, 1940 Today's Date: 08/20/2012 Time: 7829-5621 PT Time Calculation (min): 16 min  PT Assessment / Plan / Recommendation Clinical Impression  Pt is a pleasent 72 y.o. male s/p CABG. Patient demonstrates deficits in functional mobility secondary to pain, deconditioning, decreased activity tolerance and generalized weakness. Pt will benefit from continued skilled PT acutely to address deficits and maximize function. Patient does not wish to pursue rehab upon discharge.  It will be in our efforts to progress pt with mobility to ensure safety and recommend continued HHPT and family assist upon discharge.    PT Assessment  Patient needs continued PT services    Follow Up Recommendations  Home health PT;Supervision/Assistance - 24 hour    Does the patient have the potential to tolerate intense rehabilitation      Barriers to Discharge Decreased caregiver support wife works    Engineer, agricultural with 5" wheels    Recommendations for Other Services OT consult   Frequency Min 3X/week    Precautions / Restrictions Precautions Precautions: Sternal Precaution Comments: explained importance of following sternal precautions   Pertinent Vitals/Pain 2/10 at this time      Mobility  Bed Mobility Bed Mobility: Not assessed Transfers Transfers: Sit to Stand;Stand to Sit Sit to Stand: 4: Min assist;From chair/3-in-1 Stand to Sit: 4: Min assist;To chair/3-in-1 Details for Transfer Assistance: VCs for no use of UE to comply with sternal precautions; instructed pt in rocking technique to assist with transfers. Patient will need further reinforcement Ambulation/Gait Ambulation/Gait Assistance: 4: Min guard Ambulation Distance (Feet): 10 Feet (limited ambulation to in room; pt just complete amb with CR) Assistive device: Rolling walker Ambulation/Gait Assistance Details: Patient  just completed extended ambulation with cardiac rehab. Patient in room steady with 2 liters O2 and saturations at 94% with minimal ambulation. Will progress Gait Pattern: Decreased stride length;Trunk flexed;Narrow base of support        PT Diagnosis: Difficulty walking;Generalized weakness;Acute pain  PT Problem List: Decreased strength;Decreased range of motion;Decreased activity tolerance;Decreased balance;Decreased mobility;Decreased knowledge of use of DME;Cardiopulmonary status limiting activity;Pain PT Treatment Interventions: DME instruction;Gait training;Stair training;Functional mobility training;Therapeutic activities;Therapeutic exercise;Patient/family education   PT Goals Acute Rehab PT Goals PT Goal Formulation: With patient Time For Goal Achievement: 09/03/12 Potential to Achieve Goals: Good Pt will go Supine/Side to Sit: with modified independence PT Goal: Supine/Side to Sit - Progress: Goal set today Pt will go Sit to Stand: with modified independence PT Goal: Sit to Stand - Progress: Goal set today Pt will Ambulate: with modified independence PT Goal: Ambulate - Progress: Goal set today Pt will Go Up / Down Stairs: 6-9 stairs;with min assist PT Goal: Up/Down Stairs - Progress: Goal set today  Visit Information  Last PT Received On: 08/20/12 Assistance Needed: +1    Subjective Data  Subjective: I don't want to go to a rehab Patient Stated Goal: to go home   Prior Functioning  Home Living Lives With: Spouse Available Help at Discharge: Family;Available PRN/intermittently (Wife works) Type of Home: House Home Access: Stairs to enter Secretary/administrator of Steps: 6 Entrance Stairs-Rails: Right;Left;Can reach both Home Layout: One level Bathroom Shower/Tub: Network engineer: Bedside commode/3-in-1;Grab bars in shower Prior Function Level of Independence: Independent Able to Take Stairs?: Yes Driving:  Yes Vocation: Retired (drives truck part time) Musician: No difficulties Dominant Hand: Right    Cognition  Cognition Arousal/Alertness: Awake/alert Behavior During  Therapy: WFL for tasks assessed/performed Overall Cognitive Status: Within Functional Limits for tasks assessed    Extremity/Trunk Assessment Right Upper Extremity Assessment RUE ROM/Strength/Tone: Deficits;Unable to fully assess;Due to precautions RUE ROM/Strength/Tone Deficits: sternal precautions limited ROM RUE Sensation: WFL - Light Touch;WFL - Proprioception RUE Coordination: WFL - gross/fine motor Left Upper Extremity Assessment LUE ROM/Strength/Tone: Deficits;Unable to fully assess;Due to precautions LUE ROM/Strength/Tone Deficits: sternal precautions limited ROM LUE Sensation: WFL - Light Touch;WFL - Proprioception LUE Coordination: WFL - gross/fine motor Right Lower Extremity Assessment RLE ROM/Strength/Tone: WFL for tasks assessed RLE Sensation: WFL - Light Touch;WFL - Proprioception RLE Coordination: WFL - gross/fine motor Left Lower Extremity Assessment LLE ROM/Strength/Tone: WFL for tasks assessed LLE Sensation: WFL - Light Touch;WFL - Proprioception LLE Coordination: WFL - gross/fine motor Trunk Assessment Trunk Assessment: Normal      End of Session PT - End of Session Equipment Utilized During Treatment: Gait belt;Oxygen Activity Tolerance: Patient limited by fatigue Patient left: in chair;with call bell/phone within reach Nurse Communication: Mobility status  GP     Fabio Asa 08/20/2012, 3:57 PM Charlotte Crumb, PT DPT  [phone]

## 2012-08-20 NOTE — Progress Notes (Signed)
CARDIAC REHAB PHASE I   PRE:  Rate/Rhythm: 94 SR  BP:  Supine:   Sitting: 134/80  Standing:    SaO2: 100 1L  MODE:  Ambulation: 300 ft   POST:  Rate/Rhythm: 104  BP:  Supine:   Sitting: 144/78  Standing:    SaO2: 80 RA 93 1L 1500--1535 Assisted X 1 and used walker to ambulate. Gait steady with walker. Pt needs encouragement to walk and to increase distance.He is  DOE room air sat after walk 80%, placed O2 back on at 1L. Pt's sat improved to 93. Pt is not using IS as directed, encouraged him to. Pt in recliner after walk with call light in reach.Reported to RN about sats.  Melina Copa RN 08/20/2012 3:29 PM

## 2012-08-20 NOTE — Progress Notes (Signed)
Pt ambulated in hallway 115ft, with RN and rolling walker, on room air sats 89 to 90 % replaced on @ 2 liters on return to room to sit in chair. Reinforced use of incentive Clayton Austin A

## 2012-08-20 NOTE — Discharge Summary (Signed)
[address]       [place] [address]             [phone]     Clayton Austin 09/05/1940 72 y.o. [id number]  08/16/2012   Loreli Slot, MD  chest pain CHF Chest pain Left Main Disease  HPI: At the time of consultation: 72 yo male presents with a 3 week history of exertional chest pain.  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 He has no prior cardiac history. He started experiencing exertional chest pain about 3 weeks ago. He describes this as a burning sensation in the mid sternal area. It occurs with moderate exertion and is accompanied by shortness of breath. It usually lasts only a couple of minutes and is relieved by rest. He has not had any rest or nocturnal pain. He went to the ED after an episode that was more severe and was admitted with a mildly positive troponin of 0.06. He was transferred to [facility]. This afternoon he had a cardiac catheterization. It revealed severe left main and 3 vessel CAD. He is currently pain free. He was seen in cardiothoracic surgical consultation by Charlett Lango M.D. who evaluated the patient and studies and recommended surgical coronary artery revascularization. Past Medical History   Diagnosis  Date   .  Hypertension    .  Anginal pain    Hyperlipidemia  BPH  Past Surgical History   Procedure  Laterality  Date   .  Prostate surgery   06/2012    History reviewed. No pertinent family history.  Father CAD- MI at age 74  Social History: reports that he has quit smoking. His smoking use included Cigarettes. He has a 5 pack-year smoking history. He does not have any smokeless tobacco history on file. He reports that he does not drink alcohol or use illicit drugs.  Allergies: No Known Allergies  Medications:  Prior to Admission:  Prescriptions prior to admission   Medication  Sig  Dispense  Refill   .  amLODipine (NORVASC) 10 MG tablet  Take 10 mg by mouth  daily.     Marland Kitchen  aspirin EC 81 MG tablet  Take 81 mg by mouth once.     Marland Kitchen  atorvastatin (LIPITOR) 20 MG tablet  Take 20 mg by mouth daily.     .  Multiple Vitamin (MULTIVITAMIN WITH MINERALS) TABS  Take 1 tablet by mouth daily. One a day for men     .  nebivolol (BYSTOLIC) 5 MG tablet  Take 5 mg by mouth daily.     .  tamsulosin (FLOMAX) 0.4 MG CAPS  Take 0.4 mg by mouth daily after breakfast.         Hospital Course:    The patient was taken the operating room on 08/17/2012 at which time he underwent the following procedure:  OPERATIVE REPORT  PREOPERATIVE DIAGNOSIS: Severe left main and three-vessel coronary  disease.  POSTOPERATIVE DIAGNOSIS: Severe left main and three-vessel coronary  disease.  PROCEDURE: Median sternotomy, extracorporeal circulation, coronary  artery bypass grafting x5 (sequential left internal mammary artery to  first diagonal and LAD, saphenous vein graft to OM-2, sequential  saphenous vein graft to posterior descending and posterolateral),  endoscopic vein harvest, right leg.  SURGEON: Salvatore Decent. Dorris Fetch, M.D.  ASSISTANT: Doree Fudge, PA.  ANESTHESIA: General.  FINDINGS: Transesophageal echocardiography revealed mild hypokinesis  with no significant valvular pathology. LAD 1 mm  good quality target,  diagonal fair quality target, OM-2 good quality target. Posterior  descending diffusely diseased. Posterolateral good quality conduits. The patient was taken from the operating room to  the Surgical Intensive Care Unit intubated and in good condition. It is noted that prior to beginning this procedure he required urological consultation and intervention. The following procedure was performed by Dr. Sebastian Ache: OPERATIVE REPORT  DIAGNOSIS: Difficult Foley urethral stricture.  PROCEDURE: Emergent cystoscopy with urethral dilation and placement of  Foley catheter, complicated.  FINDINGS:  1. Proximal penile stricture approximately 12-French  predilation, 18-  [place] post dilation.  2. Unremarkable urinary bladder.  3. Prostatic fossa with history of recent TURP.  COMPLICATIONS: None.  SPECIMENS: None.  DRAINS: An 18-French [place] catheter straight drain.   Postoperative hospital course:  The patient has overall progressed nicely. He was weaned from the ventilator without difficulty. He has been weaned from drips without difficulty. All routine lines, monitors and drainage devices have been discontinued in the standard fashion with the exception of the Foley catheter. He is tolerating gradually increasing activities using standard protocols. He has remained hemodynamically stable without cardiac dysrhythmias. Incisions are healing well without evidence of infection. Oxygen has been weaned and he required aggressive pulmonary toilet. He does have an expected acute blood loss anemia and values have stabilized. He has had some postoperative volume overload but is responding to diuretics. He has been treated with a 5 day course of Cipro for presumed urinary tract infection and leukocytosis. His urinalysis did show bacteremia for culture was negative. With Foley in place it was felt that he should be treated. The leukocytosis has improved. He is also back on Flomax. Per urology the Foley should be in for 2 weeks from procedure. Then he can have a voiding trial. His overall progress is felt to be stable for transfer to the skilled nursing facility for ongoing care and rehabilitation.     Recent Labs  08/23/12 0448  NA 136  K 4.3  CL 101  CO2 26  GLUCOSE 90  BUN 27*  CALCIUM 8.8   No results found for this basename: WBC, HGB, HCT, PLT,  in the last 72 hours No results found for this basename: INR,  in the last 72 hours   Discharge Instructions:  The patient is discharged to home with extensive instructions on wound care and progressive ambulation.  They are instructed not to drive or perform any heavy lifting until returning to  see the physician in his office.  Discharge Diagnosis:  chest pain CHF Chest pain Left Main Disease  Secondary Diagnosis: Patient Active Problem List   Diagnosis Date Noted  . CAD (coronary artery disease) of artery bypass graft 08/24/2012  . Atrial fibrillation 08/23/2012   Past Medical History  Diagnosis Date  . Hypertension   . Anginal pain   . Hyperlipidemia   . Benign prostatic hypertrophy        Follow-up Information   Follow up with Loreli Slot, MD. (09/14/2012 at 11 AM to see the surgeon. Please obtain a chest x-ray at [facility] imaging 1 hour prior to this appointment. [facility] is located in the same office complex.)    Contact information:   [address] [place] [address] [phone]       Follow up with Tonny Bollman, MD. (2 weeks-contact the office to arrange)    Contact information:   [address] [place] [address] [phone]  Medication List    TAKE these medications       amiodarone 400 MG tablet  Commonly known as:  PACERONE  Take 1 tablet (400 mg total) by mouth every 12 (twelve) hours.     amiodarone 400 MG tablet  Commonly known as:  PACERONE  Take 1 tablet (400 mg total) by mouth daily.  Start taking on:  08/29/2012     amLODipine 10 MG tablet  Commonly known as:  NORVASC  Take 10 mg by mouth daily.     aspirin 325 MG EC tablet  Take 1 tablet (325 mg total) by mouth daily.     atorvastatin 20 MG tablet  Commonly known as:  LIPITOR  Take 20 mg by mouth daily.     furosemide 40 MG tablet  Commonly known as:  LASIX  Take 1 tablet (40 mg total) by mouth daily. For 5 days     multivitamin with minerals Tabs  Take 1 tablet by mouth daily. One a day for men     nebivolol 5 MG tablet  Commonly known as:  BYSTOLIC  Take 5 mg by mouth daily.     oxyCODONE 5 MG immediate release tablet  Commonly known as:  Oxy IR/ROXICODONE  Take 1-2 tablets (5-10 mg total) by  mouth every 4 (four) hours as needed.     potassium chloride SA 20 MEQ tablet  Commonly known as:  K-DUR,KLOR-CON  Take 1 tablet (20 mEq total) by mouth daily. For 5 days     tamsulosin 0.4 MG Caps  Commonly known as:  FLOMAX  Take 0.4 mg by mouth daily after breakfast.       The patient has been discharged on:   1.Beta Blocker:  Yes [ y  ]                              No   [   ]                              If No, reason:  2.Ace Inhibitor/ARB: Yes [   ]                                     No  [  n  ]                                     If No, reason: Normal LV function.BP  low at times  3.Statin:   Yes [  y ]                  No  [   ]up                   If No, reason:  4.Ecasa:  Yes  Cove.Etienne  ]                  No   [   ]                  If No, reason:  Disposition: To SNF  Patient's condition is Good  Will need voiding trial for foley removal 08/31/2012 which is 2 weeks from placement. Will obtain CT  of chest at follow-up for lung nodule seen on preop CXR    Gershon Crane, PA-C 08/25/2012  1:21 PM

## 2012-08-20 NOTE — Progress Notes (Signed)
Pt complaining of hiccups all night and this am. Otherwise doing fairly well. He walked yesterday without problems. Tele reviewed and he is maintaining sinus rhythm. Meds reviewed and all appropriate. Post-discharge will need follow-up cardiology appt in [place]. Overall making good progress. Appreciate Dr Hendrickson's care.  Tonny Bollman 08/20/2012 8:33 AM

## 2012-08-20 NOTE — Progress Notes (Addendum)
[address]       [place] [address]             [phone]    3 Days Post-Op  Procedure(s) (LRB): CYSTOSCOPY FLEXIBLE for difficult  foley catheter insertion (N/A) CORONARY ARTERY BYPASS GRAFTING (CABG) (N/A) URETHRA DILATATION (N/A) Subjective: Feels stronger, but somewhat weak  Objective  Telemetry sinus rhythm, sinus tach  Temp:  [97.8 F (36.6 C)-99.7 F (37.6 C)] 98.5 F (36.9 C) (05/02 0408) Pulse Rate:  [73-99] 97 (05/02 0408) Resp:  [18-24] 19 (05/02 0408) BP: (111-155)/(61-81) 155/81 mmHg (05/02 0408) SpO2:  [93 %-99 %] 94 % (05/02 0408) Weight:  [179 lb 14.3 oz (81.6 kg)] 179 lb 14.3 oz (81.6 kg) (05/02 0408)   Intake/Output Summary (Last 24 hours) at 08/20/12 0759 Last data filed at 08/19/12 2245  Gross per 24 hour  Intake    830 ml  Output   1970 ml  Net  -1140 ml       General appearance: alert, cooperative and no distress Heart: regular rate and rhythm Lungs: di in bases Abdomen: soft, + BS, mild distension Extremities: no edema Wound: incisions healing well  Lab Results:  Recent Labs  08/18/12 0340 08/18/12 1600 08/19/12 0430 08/20/12 0550  NA 138 140 136 136  K 3.9 4.1 3.7 4.0  CL 107 104 102 98  CO2 23  --  26 27  GLUCOSE 140* 154* 137* 112*  BUN 12 16 22 21   CREATININE 0.94 1.04  1.10 1.08 0.93  CALCIUM 8.0*  --  8.9 9.1  MG 2.6* 2.6*  --   --    No results found for this basename: AST, ALT, ALKPHOS, BILITOT, PROT, ALBUMIN,  in the last 72 hours No results found for this basename: LIPASE, AMYLASE,  in the last 72 hours  Recent Labs  08/19/12 0430 08/20/12 0550  WBC 19.3* 23.8*  HGB 9.4* 10.3*  HCT 26.7* 29.1*  MCV 86.1 86.1  PLT 124* 161   No results found for this basename: CKTOTAL, CKMB, TROPONINI,  in the last 72 hours No components found with this basename: POCBNP,  No results found for this basename: DDIMER,  in the last 72 hours No results found for this basename: HGBA1C,  in the last 72  hours No results found for this basename: CHOL, HDL, LDLCALC, TRIG, CHOLHDL,  in the last 72 hours No results found for this basename: TSH, T4TOTAL, FREET3, T3FREE, THYROIDAB,  in the last 72 hours No results found for this basename: VITAMINB12, FOLATE, FERRITIN, TIBC, IRON, RETICCTPCT,  in the last 72 hours  Medications: Scheduled . acetaminophen  1,000 mg Oral Q6H   Or  . acetaminophen (TYLENOL) oral liquid 160 mg/5 mL  975 mg Per Tube Q6H  . amLODipine  10 mg Oral Daily  . aspirin EC  325 mg Oral Daily   Or  . aspirin  324 mg Per Tube Daily  . atorvastatin  20 mg Oral Daily  . bisacodyl  10 mg Oral Daily   Or  . bisacodyl  10 mg Rectal Daily  . docusate sodium  200 mg Oral Daily  . enoxaparin (LOVENOX) injection  40 mg Subcutaneous QHS  . insulin aspart  0-15 Units Subcutaneous Q4H  . multivitamin with minerals  1 tablet Oral Daily  . nebivolol  5 mg Oral Daily  . pantoprazole  40 mg Oral Daily  . sodium chloride  3 mL Intravenous Q12H  . tamsulosin  0.4 mg  Oral QPC breakfast     Radiology/Studies:  Dg Chest 2 View  08/20/2012  *RADIOLOGY REPORT*  Clinical Data: Post CABG, CAD  CHEST - 2 VIEW  Comparison: 08/19/2012; 08/18/2012; 08/17/2012  Findings:  Grossly unchanged cardiac silhouette and mediastinal contours given slightly reduced lung volumes.  Post median sternotomy and CABG. Interval removal of right jugular approach vascular sheath. Mild cephalization of flow without frank evidence of edema.  Grossly unchanged small bilateral effusions and associated bibasilar opacities, left greater than right.  No pneumothorax.  Unchanged bones.  IMPRESSION: 1.  No pneumothorax 2.  Mild pulmonary congestion without frank evidence of edema. 3.  Unchanged small bilateral effusions and associated bibasilar opacities, left greater than right, atelectasis versus infiltrate.   Original Report Authenticated By: Tacey Ruiz, MD    Dg Chest Port 1 View  08/19/2012  *RADIOLOGY REPORT*  Clinical  Data: Status post cardiac surgery  PORTABLE CHEST - 1 VIEW  Comparison: August 18, 2012.  Findings: Stable mild cardiomegaly.  Sternotomy wires are noted. Swan-Ganz catheter has been removed.  Left-sided chest tube has been removed without pneumothorax.  Left basilar subsegmental atelectasis remains with associated pleural effusion.  Right lung is clear.  IMPRESSION: Swan-Ganz catheter and left-sided chest tube have been removed.  No pneumothorax is seen.  Left basilar subsegmental atelectasis with associated pleural effusion remains.   Original Report Authenticated By: Lupita Raider.,  M.D.     INR: Will add last result for INR, ABG once components are confirmed Will add last 4 CBG results once components are confirmed  Assessment/Plan: S/P Procedure(s) (LRB): CYSTOSCOPY FLEXIBLE for difficult  foley catheter insertion (N/A) CORONARY ARTERY BYPASS GRAFTING (CABG) (N/A) URETHRA DILATATION (N/A)  1. Clinically conts to improve, cont to push rehab/pulm toilet 2  BP up at times, will change bysystolic to BID since a little tachy at times 3 labs stable, H/H improved 4 cbg's good control 5 cont to diurese with pl effusions, vasc congestion(mild)  LOS: 4 days    GOLD,WAYNE E 5/2/20147:59 AM  Patient seen and examined. Agree with above Will keep foley in for 2 weeks as instructed by Dr. Berneice Heinrich WBC up, afebrile, central line is out, will check urine culture

## 2012-08-20 NOTE — Progress Notes (Signed)
Pt states he does not want to walk anymore tonight, that he already walked today.   Clayton Austin

## 2012-08-21 LAB — CBC
Hemoglobin: 10.1 g/dL — ABNORMAL LOW (ref 13.0–17.0)
MCHC: 35.4 g/dL (ref 30.0–36.0)
RDW: 13.3 % (ref 11.5–15.5)

## 2012-08-21 LAB — HEPATIC FUNCTION PANEL
ALT: 18 U/L (ref 0–53)
AST: 34 U/L (ref 0–37)
Albumin: 3.1 g/dL — ABNORMAL LOW (ref 3.5–5.2)
Alkaline Phosphatase: 68 U/L (ref 39–117)
Bilirubin, Direct: 0.1 mg/dL (ref 0.0–0.3)
Indirect Bilirubin: 0.6 mg/dL (ref 0.3–0.9)
Total Bilirubin: 0.7 mg/dL (ref 0.3–1.2)
Total Protein: 7.3 g/dL (ref 6.0–8.3)

## 2012-08-21 LAB — GLUCOSE, CAPILLARY: Glucose-Capillary: 113 mg/dL — ABNORMAL HIGH (ref 70–99)

## 2012-08-21 LAB — URINE CULTURE

## 2012-08-21 LAB — BASIC METABOLIC PANEL
GFR calc Af Amer: 70 mL/min — ABNORMAL LOW (ref >90)
GFR calc non Af Amer: 60 mL/min — ABNORMAL LOW (ref >90)
Potassium: 4.3 mEq/L (ref 3.5–5.1)
Sodium: 138 mEq/L (ref 135–145)

## 2012-08-21 LAB — TSH: TSH: 16.266 u[IU]/mL — ABNORMAL HIGH (ref 0.350–4.500)

## 2012-08-21 MED ORDER — AMIODARONE HCL IN DEXTROSE 360-4.14 MG/200ML-% IV SOLN
30.0000 mg/h | INTRAVENOUS | Status: DC
  Administered 2012-08-21: 30 mg/h via INTRAVENOUS
  Filled 2012-08-21 (×3): qty 200

## 2012-08-21 MED ORDER — METOPROLOL TARTRATE 1 MG/ML IV SOLN: 5.0000 mg | Freq: Once | INTRAVENOUS | Status: DC

## 2012-08-21 MED ORDER — CIPROFLOXACIN HCL 500 MG PO TABS
500.0000 mg | ORAL_TABLET | Freq: Two times a day (BID) | ORAL | Status: DC
  Administered 2012-08-21 – 2012-08-25 (×7): 500 mg via ORAL
  Filled 2012-08-21 (×10): qty 1

## 2012-08-21 MED ORDER — AMIODARONE LOAD VIA INFUSION
150.0000 mg | Freq: Once | INTRAVENOUS | Status: AC
  Administered 2012-08-21: 150 mg via INTRAVENOUS
  Filled 2012-08-21: qty 83.34

## 2012-08-21 MED ORDER — AMIODARONE HCL IN DEXTROSE 360-4.14 MG/200ML-% IV SOLN
60.0000 mg/h | INTRAVENOUS | Status: AC
  Administered 2012-08-21 (×2): 60 mg/h via INTRAVENOUS
  Filled 2012-08-21 (×2): qty 200

## 2012-08-21 MED ORDER — AMIODARONE HCL 200 MG PO TABS: 400.0000 mg | ORAL_TABLET | Freq: Every day | ORAL | Status: DC

## 2012-08-21 MED ORDER — AMIODARONE HCL 200 MG PO TABS
400.0000 mg | ORAL_TABLET | Freq: Two times a day (BID) | ORAL | Status: DC
  Administered 2012-08-21 – 2012-08-25 (×8): 400 mg via ORAL
  Filled 2012-08-21 (×9): qty 2

## 2012-08-21 MED ORDER — METOPROLOL TARTRATE 1 MG/ML IV SOLN
INTRAVENOUS | Status: AC
  Filled 2012-08-21: qty 5

## 2012-08-21 NOTE — Progress Notes (Signed)
08/21/2012 1600 Notified by CMT pt. Back in NSR rate 74. Will continue to monitor patient.  Aydeen Blume, Blanchard Kelch

## 2012-08-21 NOTE — Progress Notes (Signed)
Patient did not walk during the night shift.  He is currently receiving his amiodarone infusion.  Will continue to monitor.

## 2012-08-21 NOTE — Progress Notes (Signed)
  Amiodarone Drug - Drug Interaction Consult Note  Recommendations: Potential for drug interactions you need to be aware of are noted below/ box checked if patient currently on these medications. No drug or dose changed recommended at this time.   Amiodarone is metabolized by the cytochrome P450 system and therefore has the potential to cause many drug interactions. Amiodarone has an average plasma half-life of 50 days (range 20 to 100 days).   There is potential for drug interactions to occur several weeks or months after stopping treatment and the onset of drug interactions may be slow after initiating amiodarone.   [x]  Statins: Increased risk of myopathy. Simvastatin- restrict dose to 20mg  daily. Other statins: counsel patients to report any muscle pain or weakness immediately.  []  Anticoagulants: Amiodarone can increase anticoagulant effect. Consider warfarin dose reduction. Patients should be monitored closely and the dose of anticoagulant altered accordingly, remembering that amiodarone levels take several weeks to stabilize.  []  Antiepileptics: Amiodarone can increase plasma concentration of phenytoin, the dose should be reduced. Note that small changes in phenytoin dose can result in large changes in levels. Monitor patient and counsel on signs of toxicity.  [x]  Beta blockers: increased risk of bradycardia, AV block and myocardial depression. Sotalol - avoid concomitant use.  []   Calcium channel blockers (diltiazem and verapamil): increased risk of bradycardia, AV block and myocardial depression.  []   Cyclosporine: Amiodarone increases levels of cyclosporine. Reduced dose of cyclosporine is recommended.  []  Digoxin dose should be halved when amiodarone is started.  [x]  Diuretics: increased risk of cardiotoxicity if hypokalemia occurs.  []  Oral hypoglycemic agents (glyburide, glipizide, glimepiride): increased risk of hypoglycemia. Patient's glucose levels should be monitored closely  when initiating amiodarone therapy.   [x]  Drugs that prolong the QT interval:  Torsades de pointes risk may be increased with concurrent use - avoid if possible.  Monitor QTc, also keep magnesium/potassium WNL if concurrent therapy can't be avoided. Marland Kitchen Antibiotics: e.g. fluoroquinolones, erythromycin. . Antiarrhythmics: e.g. quinidine, procainamide, disopyramide, sotalol. . Antipsychotics: e.g. phenothiazines, haloperidol.  . Lithium, tricyclic antidepressants, and methadone. Thank You,  Arman Filter , RPh  08/21/2012 4:18 PM

## 2012-08-21 NOTE — Progress Notes (Addendum)
                    [address]            [place] [address]          [phone]     4 Days Post-Op Procedure(s) (LRB): CYSTOSCOPY FLEXIBLE for difficult  foley catheter insertion (N/A) CORONARY ARTERY BYPASS GRAFTING (CABG) (N/A) URETHRA DILATATION (N/A)  Subjective: Feels well, no complaints.  Appetite still not great, but had a BM today.   Objective: Vital signs in last 24 hours: Patient Vitals for the past 24 hrs:  BP Temp Temp src Pulse Resp SpO2 Weight  08/21/12 0932 124/72 mmHg - - 80 - - -  08/21/12 0430 113/65 mmHg 98.5 F (36.9 C) Oral 75 19 95 % 175 lb 3.2 oz (79.47 kg)  08/20/12 2104 128/63 mmHg 98.8 F (37.1 C) Oral 85 18 95 % -  08/20/12 1400 129/69 mmHg 97.6 F (36.4 C) Oral 89 18 97 % -  08/20/12 0958 118/63 mmHg - - - - - -   Current Weight  08/21/12 175 lb 3.2 oz (79.47 kg)   PRE OP WEIGHT: 76 kg   Intake/Output from previous day: 05/02 0701 - 05/03 0700 In: 490 [P.O.:490] Out: 1451 [Urine:1450; Stool:1]  CBGs [phone]   PHYSICAL EXAM:  Heart: RRR Lungs: Clear Wound: Clean and dry Extremities: Trace LE edema    Lab Results: CBC: Recent Labs  08/20/12 0550 08/21/12 0500  WBC 23.8* 19.6*  HGB 10.3* 10.1*  HCT 29.1* 28.5*  PLT 161 181   BMET:  Recent Labs  08/20/12 0550 08/21/12 0500  NA 136 138  K 4.0 4.3  CL 98 101  CO2 27 27  GLUCOSE 112* 106*  BUN 21 27*  CREATININE 0.93 1.17  CALCIUM 9.1 9.0    PT/INR: No results found for this basename: LABPROT, INR,  in the last 72 hours  U/A= many bacteria, TNTC red cells, moderate leukocytes   Assessment/Plan: S/P Procedure(s) (LRB): CYSTOSCOPY FLEXIBLE for difficult  foley catheter insertion (N/A) CORONARY ARTERY BYPASS GRAFTING (CABG) (N/A) URETHRA DILATATION (N/A)  CV- BPs stable, SR. Continue current meds.  GU- difficult Foley insertion.  Continue Foley x 2 weeks per urology.  Back on Flomax.  Leukocytosis, no fever.  Likely UTI given  difficult Foley insertion and increased bacteria on U/A. Urine cx pending.  Will start empiric abx and follow up cx.  CRPI/PT.  Disp- pt would like SNF at d/c as he does not have 24 hr care at home.  CSW to pursue.   LOS: 5 days    COLLINS,GINA H 08/21/2012  Now in radid afib, will start iv cordarone I have seen and examined Loann Quill and agree with the above assessment  and plan.  Delight Ovens MD Beeper [phone] Office [phone] 08/21/2012 11:14 AM

## 2012-08-21 NOTE — Progress Notes (Signed)
08/21/2012' 2:41 PM Nursing note Prior to administering ordered iv metoprolol 5mg , pt BP noted to be 95/49. HR 102 AFIB. Coral Ceo [facility] paged again and made aware. Verbal orders received to hold iv metoprolol and d/c order. Orders enacted. Ordered metoprolol iv was not given to patient. Will continue to closely monitor patient.  Ellanie Oppedisano, Blanchard Kelch

## 2012-08-21 NOTE — Progress Notes (Signed)
PT Cancellation Note  Patient Details Name: Clayton Austin MRN: [id number] DOB: [birth year]   Cancelled Treatment:    Reason Eval/Treat Not Completed: Medical issues which prohibited therapy.  Pt in A-Fib.     Verdell Face, [place] [phone] 08/21/2012

## 2012-08-21 NOTE — Progress Notes (Signed)
CARDIAC REHAB PHASE I   PRE:  Rate/Rhythm: 74SR  BP:  Supine:   Sitting: 140/72  Standing:    SaO2: 95%1L  MODE:  Ambulation: 200 ft   POST:  Rate/Rhythm: 82SR  BP:  Supine:   Sitting: 146/70  Standing:    SaO2: 90%RA to 96%1L 0825-916 Pt ambulated fairly well with walker and assistance x1. Tried to wean off supp O2 during ambulation, pt dropped to 90%RA, then increased to 96% on 1L. Education completed. Pt eager to begin healthy lifestyle. Pt interested in [facility] in [place]. Referral will be sent. Returned pt to recliner with phone and call light within reach.    Deetta Perla

## 2012-08-21 NOTE — Progress Notes (Signed)
08/21/2012 11:07 AM Nursing note On monitor patient noted to be in AFib rate 138. Pt. Asymptomatic. BP 131/71. HR 148. o2 saturations 98% on 2L. Pt. Assisted back to bed. EKG performed. Dr. Tyrone Sage at bedside. Orders received and enacted. Will continue to closely monitor patient.  Nusrat Encarnacion, Blanchard Kelch

## 2012-08-21 NOTE — Progress Notes (Signed)
Patient told evening nurse that he was having constant hiccups for over an hour. Patient was given IV thorazine.  Patient's hiccups stopped. Will continue to monitor.

## 2012-08-21 NOTE — Progress Notes (Signed)
08/21/2012 2:27 PM Nursing note Pt HR. Still Afib rate 115-120. Pt. BP 104/63. Coral Ceo [facility] paged and updated on situation including Amiodarone gtt and Bystolic already given this morning. Verbal orders received to administer metoprolol 5mg  iv x 1. Orders enacted. Will continue to closely monitor patient.  Dereona Kolodny, Blanchard Kelch

## 2012-08-22 LAB — GLUCOSE, CAPILLARY
Glucose-Capillary: 79 mg/dL (ref 70–99)
Glucose-Capillary: 85 mg/dL (ref 70–99)

## 2012-08-22 LAB — BASIC METABOLIC PANEL
BUN: 30 mg/dL — ABNORMAL HIGH (ref 6–23)
CO2: 25 mEq/L (ref 19–32)
Glucose, Bld: 87 mg/dL (ref 70–99)
Potassium: 4.1 mEq/L (ref 3.5–5.1)
Sodium: 136 mEq/L (ref 135–145)

## 2012-08-22 LAB — CBC
Hemoglobin: 9.4 g/dL — ABNORMAL LOW (ref 13.0–17.0)
MCH: 30 pg (ref 26.0–34.0)
MCHC: 34.7 g/dL (ref 30.0–36.0)
MCV: 86.6 fL (ref 78.0–100.0)
RBC: 3.13 MIL/uL — ABNORMAL LOW (ref 4.22–5.81)

## 2012-08-22 LAB — MAGNESIUM: Magnesium: 2.1 mg/dL (ref 1.5–2.5)

## 2012-08-22 NOTE — Progress Notes (Addendum)
08/22/2012 1610 Pt. C/o hiccoughs. PRN thorazine already ordered and given to patient per orders. Upon reassessment, pt. States relief from hiccoughs. Will continue to monitor. Adama Ivins, Blanchard Kelch

## 2012-08-22 NOTE — Progress Notes (Signed)
Physical Therapy Treatment Patient Details Name: Clayton Austin MRN: [id number] DOB: 04-30-1940 Today's Date: 08/22/2012 Time: 4098-1191 PT Time Calculation (min): 19 min  PT Assessment / Plan / Recommendation Comments on Treatment Session  Patient felt that he "over-did-it" yesterday walking in the hallway.  Ambulated less distance today with no dyspnea.  Will continue to improve.    Follow Up Recommendations  SNF (Patient reports he does not have 24 hour assist at discharge)     Does the patient have the potential to tolerate intense rehabilitation     Barriers to Discharge        Equipment Recommendations  Rolling walker with 5" wheels    Recommendations for Other Services    Frequency Min 3X/week   Plan Discharge plan needs to be updated;Frequency remains appropriate    Precautions / Restrictions Precautions Precautions: Sternal Restrictions Weight Bearing Restrictions: No Other Position/Activity Restrictions: Sternal precautions   Pertinent Vitals/Pain     Mobility  Bed Mobility Bed Mobility: Rolling Left;Left Sidelying to Sit;Sitting - Scoot to [place] of Bed;Sit to Supine Rolling Left: 4: Min guard Left Sidelying to Sit: 4: Min assist;HOB elevated Sitting - Scoot to [place] of Bed: 4: Min assist Sit to Supine: 4: Min assist Details for Bed Mobility Assistance: Verbal cues for technique to maintain sternal precautions.  Assist to raise trunk from bed.  Assist to raise LE's onto bed to return to supine. Transfers Transfers: Sit to Stand;Stand to Sit Sit to Stand: 4: Min assist;Without upper extremity assist;From bed Stand to Sit: 4: Min assist;Without upper extremity assist;To bed Details for Transfer Assistance: Verbal cues for technique to maintain sternal precautions.  Assist to rise to standing. Ambulation/Gait Ambulation/Gait Assistance: 4: Min guard Ambulation Distance (Feet): 130 Feet Assistive device: Rolling walker Ambulation/Gait Assistance Details: Verbal  cues to stay close to RW and stand upright.  Cues to minimize weight through UE's and use RW for balance. Gait Pattern: Step-through pattern;Decreased stride length;Trunk flexed;Narrow base of support Gait velocity: Slow gait speed      PT Goals Acute Rehab PT Goals Pt will go Supine/Side to Sit: with modified independence PT Goal: Supine/Side to Sit - Progress: Progressing toward goal Pt will go Sit to Stand: with modified independence PT Goal: Sit to Stand - Progress: Progressing toward goal Pt will Ambulate: with modified independence PT Goal: Ambulate - Progress: Progressing toward goal  Visit Information  Last PT Received On: 08/22/12 Assistance Needed: +1    Subjective Data  Subjective: Patient tearful following ambulation. "I've never been sick. I'm having a hard time adjusting to all this. Bear with me"  Encouraged patient.   Cognition  Cognition Arousal/Alertness: Awake/alert Behavior During Therapy: WFL for tasks assessed/performed Overall Cognitive Status: Within Functional Limits for tasks assessed    Balance     End of Session PT - End of Session Equipment Utilized During Treatment: Gait belt Activity Tolerance: Patient limited by fatigue Patient left: in bed;with call bell/phone within reach Nurse Communication: Mobility status   GP     Vena Austria 08/22/2012, 4:32 PM Durenda Hurt. Renaldo Fiddler, [facility] Acute Rehab Services Pager [phone]

## 2012-08-22 NOTE — Progress Notes (Signed)
Pt stated he walked twice today and refused to do his third walk. Explained to patient the need for him to ambulate and that he needs to be walking in the halls at least 3 times a day. Pt stated he would walk tomorrow but will not walk tonight.

## 2012-08-22 NOTE — Progress Notes (Signed)
Patient remains in NSR. Patient received his first dose of 400 mg amiodarone po tonight.  Patient remained on amiodarone drip for an hour and then it was discontinued per protocol. Will continue to monitor.

## 2012-08-22 NOTE — Progress Notes (Addendum)
                    [address]            [place] [address]          [phone]     5 Days Post-Op Procedure(s) (LRB): CYSTOSCOPY FLEXIBLE for difficult  foley catheter insertion (N/A) CORONARY ARTERY BYPASS GRAFTING (CABG) (N/A) URETHRA DILATATION (N/A)  Subjective: RAF yesterday, symptomatic. Back in SR after Amiodarone. Feels better this am.   Objective: Vital signs in last 24 hours: Patient Vitals for the past 24 hrs:  BP Temp Temp src Pulse Resp SpO2 Weight  08/22/12 0943 131/79 mmHg - - 71 - - -  08/22/12 0417 116/67 mmHg 98 F (36.7 C) Oral 64 19 95 % 176 lb 9.4 oz (80.1 kg)  08/21/12 1955 117/74 mmHg 97.6 F (36.4 C) Oral 72 18 97 % -  08/21/12 1810 124/67 mmHg - - 74 - - -  08/21/12 1610 - - - 72 - - -  08/21/12 1540 109/68 mmHg - - 112 - - -  08/21/12 1441 95/49 mmHg - - 102 - - -  08/21/12 1323 114/56 mmHg 97.9 F (36.6 C) Oral 92 20 93 % -  08/21/12 1153 127/55 mmHg - - 123 - - -  08/21/12 1105 131/71 mmHg - - 148 - - -   Current Weight  08/22/12 176 lb 9.4 oz (80.1 kg)   PRE OP WEIGHT: 76 kg   Intake/Output from previous day: 05/03 0701 - 05/04 0700 In: 50 [IV Piggyback:50] Out: 850 [Urine:850]  CBGs 113-98-87-79    PHYSICAL EXAM:  Heart: RRR Lungs:Clear Wound:Clean and dry Extremities: No significant LE edema     Lab Results: CBC: Recent Labs  08/21/12 0500 08/22/12 0505  WBC 19.6* 16.0*  HGB 10.1* 9.4*  HCT 28.5* 27.1*  PLT 181 234   BMET:  Recent Labs  08/21/12 0500 08/22/12 0505  NA 138 136  K 4.3 4.1  CL 101 101  CO2 27 25  GLUCOSE 106* 87  BUN 27* 30*  CREATININE 1.17 1.37*  CALCIUM 9.0 9.0    PT/INR: No results found for this basename: LABPROT, INR,  in the last 72 hours  Urine Cx = no growth  Assessment/Plan: S/P Procedure(s) (LRB): CYSTOSCOPY FLEXIBLE for difficult  foley catheter insertion (N/A) CORONARY ARTERY BYPASS GRAFTING (CABG) (N/A) URETHRA DILATATION (N/A) CV- AF, now in  SR. Continue po Amio, Norvasc, Bystolic. BPs stable. GU- difficult Foley insertion. Continue Foley x 2 weeks per urology. Back on Flomax. Leukocytosis- WBC trending down. Urine Cx negative.  On Cipro- discussed with MD  Since he had a traumatic Foley insertion, will continue empiric Cipro for now. CRPI/PT.  Disp- pt would like SNF at d/c as he does not have 24 hr care at home. CSW consulted.      LOS: 6 days    COLLINS,GINA H 08/22/2012 Holding sinus Started on cipro for "dirty urine", but culture negative ,needs to keep foley I have seen and examined Clayton Austin and agree with the above assessment  and plan.  Delight Ovens MD Beeper [phone] Office [phone] 08/22/2012 11:25 AM

## 2012-08-23 DIAGNOSIS — I4891 Unspecified atrial fibrillation: Secondary | ICD-10-CM | POA: Diagnosis not present

## 2012-08-23 LAB — GLUCOSE, CAPILLARY
Glucose-Capillary: 94 mg/dL (ref 70–99)
Glucose-Capillary: 96 mg/dL (ref 70–99)

## 2012-08-23 LAB — BASIC METABOLIC PANEL
BUN: 27 mg/dL — ABNORMAL HIGH (ref 6–23)
CO2: 26 mEq/L (ref 19–32)
Calcium: 8.8 mg/dL (ref 8.4–10.5)
Chloride: 101 mEq/L (ref 96–112)
Creatinine, Ser: 1.39 mg/dL — ABNORMAL HIGH (ref 0.50–1.35)
GFR calc Af Amer: 57 mL/min — ABNORMAL LOW (ref >90)
GFR calc non Af Amer: 49 mL/min — ABNORMAL LOW (ref >90)
Glucose, Bld: 90 mg/dL (ref 70–99)
Potassium: 4.3 mEq/L (ref 3.5–5.1)
Sodium: 136 mEq/L (ref 135–145)

## 2012-08-23 NOTE — Progress Notes (Signed)
    SUBJECTIVE: Feels great. No chest pain or SOB.   BP 118/62  Pulse 74  Temp(Src) 98.6 F (37 C) (Oral)  Resp 20  Ht 5\' 11"  (1.803 m)  Wt 172 lb 13.5 oz (78.4 kg)  BMI 24.12 kg/m2  SpO2 97%  Intake/Output Summary (Last 24 hours) at 08/23/12 0757 Last data filed at 08/23/12 0600  Gross per 24 hour  Intake     50 ml  Output   1050 ml  Net  -1000 ml    PHYSICAL EXAM General: Well developed, well nourished, in no acute distress. Alert and oriented x 3.  Psych:  Good affect, responds appropriately Neck: No JVD. No masses noted.  Lungs: Clear bilaterally with no wheezes or rhonci noted.  Heart: RRR with no murmurs noted. Abdomen: Bowel sounds are present. Soft, non-tender.  Extremities: No lower extremity edema.   LABS: Basic Metabolic Panel:  Recent Labs  40/98/11 0505 08/23/12 0448  NA 136 136  K 4.1 4.3  CL 101 101  CO2 25 26  GLUCOSE 87 90  BUN 30* 27*  CREATININE 1.37* 1.39*  CALCIUM 9.0 8.8  MG 2.1  --    CBC:  Recent Labs  08/21/12 0500 08/22/12 0505  WBC 19.6* 16.0*  HGB 10.1* 9.4*  HCT 28.5* 27.1*  MCV 86.9 86.6  PLT 181 234   Current Meds: . amiodarone  400 mg Oral Q12H   Followed by  . [START ON 08/29/2012] amiodarone  400 mg Oral Daily  . amLODipine  10 mg Oral Daily  . aspirin EC  325 mg Oral Daily   Or  . aspirin  324 mg Per Tube Daily  . atorvastatin  20 mg Oral Daily  . bisacodyl  10 mg Oral Daily   Or  . bisacodyl  10 mg Rectal Daily  . ciprofloxacin  500 mg Oral BID  . docusate sodium  200 mg Oral Daily  . enoxaparin (LOVENOX) injection  40 mg Subcutaneous QHS  . furosemide  40 mg Oral Daily  . insulin aspart  0-15 Units Subcutaneous TID WC  . multivitamin with minerals  1 tablet Oral Daily  . nebivolol  5 mg Oral BID  . pantoprazole  40 mg Oral Daily  . potassium chloride  20 mEq Oral BID  . sodium chloride  3 mL Intravenous Q12H  . tamsulosin  0.4 mg Oral QPC breakfast     ASSESSMENT AND PLAN:  1. Severe triple  vessel CAD with left main disease: now s/p 5V CABG 08/17/12. Doing well. Continue ASA, statin, beta blocker. He has been ambulating and doing well.   2. Post-op atrial fibrillation: He is in sinus today on amiodarone. Continue for now.   Follow up can be arranged in our [facility] office with Dr. Simona Huh.   Clayton Austin  5/5/20147:57 AM

## 2012-08-23 NOTE — Evaluation (Signed)
Occupational Therapy Evaluation Patient Details Name: Clayton Austin MRN: [id number] DOB: [birth year] Today's Date: 08/23/2012 Time: 9147-8295 OT Time Calculation (min): 16 min  OT Assessment / Plan / Recommendation Clinical Impression   This 72 y.o. Male s/p CABG.  Pt presents to OT with generalized weakness and poor awareness of sternal precautions.  Pt reports he does not have 24 hour assist at discharge - wife works, currently, recommend 24 hour assist, and feel SNF level rehab is best option.  He will benefit from continued OT to maximize safety and independence with BADLs to allow pt to return to modified independent level after SNF level rehab     OT Assessment  Patient needs continued OT Services    Follow Up Recommendations  SNF;Supervision/Assistance - 24 hour    Barriers to Discharge Decreased caregiver support (wife works during day)    Equipment Recommendations  3 in 1 bedside comode    Recommendations for Other Services    Frequency  Min 2X/week    Precautions / Restrictions Precautions Precautions: Sternal Precaution Comments: Pt unable to recall sternal precautions.  He was re-educated on them, and need for compliance Restrictions Weight Bearing Restrictions: No Other Position/Activity Restrictions: Sternal precautions       ADL  Eating/Feeding: Independent Where Assessed - Eating/Feeding: Chair Grooming: Wash/dry hands;Wash/dry face;Teeth care;Min guard Where Assessed - Grooming: Supported standing Upper Body Bathing: Set up Where Assessed - Upper Body Bathing: Supported sit to stand Lower Body Bathing: Minimal assistance Where Assessed - Lower Body Bathing: Supported sit to stand Upper Body Dressing: Supervision/safety Where Assessed - Upper Body Dressing: Unsupported sitting Lower Body Dressing: Minimal assistance Where Assessed - Lower Body Dressing: Supported sit to Pharmacist, hospital: Hydrographic surveyor Method: Sit to Teacher, music: Regular height toilet Toileting - Clothing Manipulation and Hygiene: Minimal assistance Where Assessed - Engineer, mining and Hygiene: Standing Equipment Used: Rolling walker Transfers/Ambulation Related to ADLs: min guard assist with RW ADL Comments: Pt able to don/doff socks with supervision by crossing ankles over knees.  Pt requires min A for sit to stand, vc's for sternal precautions.  Limited activity tolerance    OT Diagnosis: Generalized weakness  OT Problem List: Decreased strength;Decreased activity tolerance;Impaired balance (sitting and/or standing);Decreased knowledge of use of DME or AE;Decreased knowledge of precautions;Cardiopulmonary status limiting activity OT Treatment Interventions: Self-care/ADL training;DME and/or AE instruction;Therapeutic activities;Patient/family education;Balance training   OT Goals Acute Rehab OT Goals OT Goal Formulation: With patient Time For Goal Achievement: 09/06/12 Potential to Achieve Goals: Good ADL Goals Pt Will Perform Upper Body Bathing: with modified independence;Standing at sink ADL Goal: Upper Body Bathing - Progress: Goal set today Pt Will Perform Lower Body Bathing: with modified independence;Sit to stand from chair;Sit to stand from bed ADL Goal: Lower Body Bathing - Progress: Goal set today Pt Will Perform Upper Body Dressing: with modified independence;Sitting, chair;Sitting, bed ADL Goal: Upper Body Dressing - Progress: Goal set today Pt Will Perform Lower Body Dressing: with modified independence;Sit to stand from chair;Sit to stand from bed ADL Goal: Lower Body Dressing - Progress: Goal set today Pt Will Transfer to Toilet: with modified independence;Ambulation;Comfort height toilet;3-in-1 ADL Goal: Toilet Transfer - Progress: Goal set today Pt Will Perform Toileting - Clothing Manipulation: with modified independence;Standing ADL Goal: Toileting - Clothing Manipulation - Progress: Goal  set today Pt Will Perform Toileting - Hygiene: with modified independence;Sit to stand from 3-in-1/toilet ADL Goal: Toileting - Hygiene - Progress: Goal set today Additional ADL  Goal #1: Pt will be independent with sternal precautions with all activities ADL Goal: Additional Goal #1 - Progress: Goal set today  Visit Information  Last OT Received On: 08/23/12 Assistance Needed: +1    Subjective Data  Subjective: "I think I'm gonna get back at it" Patient Stated Goal: To regain independence at home   Prior Functioning     Home Living Lives With: Spouse Available Help at Discharge: Family;Available PRN/intermittently (Wife works) Type of Home: House Home Access: Stairs to enter Secretary/administrator of Steps: 6 Entrance Stairs-Rails: Right;Left;Can reach both Home Layout: One level Bathroom Shower/Tub: Network engineer: Bedside commode/3-in-1;Grab bars in shower Prior Function Level of Independence: Independent Able to Take Stairs?: Yes Driving: Yes Vocation: Retired (drives truck part time) Musician: No difficulties Dominant Hand: Right         Vision/Perception     Cognition  Cognition Arousal/Alertness: Awake/alert Behavior During Therapy: WFL for tasks assessed/performed Overall Cognitive Status: Within Functional Limits for tasks assessed    Extremity/Trunk Assessment Right Upper Extremity Assessment RUE ROM/Strength/Tone: Deficits;Unable to fully assess;Due to precautions RUE ROM/Strength/Tone Deficits: sternal precautions limited ROM RUE Sensation: WFL - Light Touch;WFL - Proprioception RUE Coordination: WFL - gross/fine motor Left Upper Extremity Assessment LUE ROM/Strength/Tone: Deficits;Unable to fully assess;Due to precautions LUE ROM/Strength/Tone Deficits: sternal precautions limited ROM LUE Sensation: WFL - Light Touch;WFL - Proprioception LUE Coordination: WFL - gross/fine  motor Trunk Assessment Trunk Assessment: Normal     Mobility Bed Mobility Bed Mobility: Not assessed Transfers Transfers: Sit to Stand;Stand to Sit Sit to Stand: 4: Min assist;Without upper extremity assist;From bed Stand to Sit: 4: Min assist;To chair/3-in-1;Without upper extremity assist Details for Transfer Assistance: verbal cues for sternal precautions     Exercise     Balance     End of Session OT - End of Session Activity Tolerance: Patient tolerated treatment well Patient left: in chair;with call bell/phone within reach  GO     Clayton Austin M 08/23/2012, 11:47 AM

## 2012-08-23 NOTE — Progress Notes (Signed)
Physical Therapy Treatment Patient Details Name: Clayton Austin MRN: [id number] DOB: [birth year] Today's Date: 08/23/2012 Time: 8657-8469 PT Time Calculation (min): 16 min  PT Assessment / Plan / Recommendation Comments on Treatment Session  Pt making steady progress.    Follow Up Recommendations  SNF     Does the patient have the potential to tolerate intense rehabilitation     Barriers to Discharge        Equipment Recommendations  Other (comment) (rollator)    Recommendations for Other Services    Frequency Min 3X/week   Plan Discharge plan remains appropriate;Frequency remains appropriate    Precautions / Restrictions Precautions Precautions: Sternal;Fall   Pertinent Vitals/Pain VSS    Mobility  Bed Mobility Sit to Supine: 4: Min assist Details for Bed Mobility Assistance: assist to bring legs up Transfers Sit to Stand: 4: Min assist;With upper extremity assist;From chair/3-in-1 Stand to Sit: 4: Min guard;Without upper extremity assist;To bed Ambulation/Gait Ambulation/Gait Assistance: 4: Min guard Ambulation Distance (Feet): 250 Feet Assistive device: Rollator Ambulation/Gait Assistance Details: Pt like using rollator Gait Pattern: Step-through pattern;Decreased stride length Gait velocity: decr    Exercises     PT Diagnosis:    PT Problem List:   PT Treatment Interventions:     PT Goals Acute Rehab PT Goals PT Goal: Sit to Stand - Progress: Progressing toward goal PT Goal: Ambulate - Progress: Progressing toward goal  Visit Information  Last PT Received On: 08/23/12 Assistance Needed: +1    Subjective Data  Subjective: "I wanted to look cute today."   Cognition  Cognition Arousal/Alertness: Awake/alert Behavior During Therapy: WFL for tasks assessed/performed Overall Cognitive Status: Within Functional Limits for tasks assessed    Balance  Balance Balance Assessed: Yes Static Standing Balance Static Standing - Balance Support: Bilateral  upper extremity supported Static Standing - Level of Assistance: 5: Stand by assistance  End of Session PT - End of Session Activity Tolerance: Patient tolerated treatment well Patient left: in bed;with call bell/phone within reach Nurse Communication: Mobility status   GP     [facility] 08/23/2012, 3:48 PM  [facility] PT [phone]

## 2012-08-23 NOTE — Progress Notes (Addendum)
                   [address]            [place] [address]          [phone]      6 Days Post-Op Procedure(s) (LRB): CYSTOSCOPY FLEXIBLE for difficult  foley catheter insertion (N/A) CORONARY ARTERY BYPASS GRAFTING (CABG) (N/A) URETHRA DILATATION (N/A)  Subjective: Patient continues to feel better.  Objective: Vital signs in last 24 hours: Temp:  [97.4 F (36.3 C)-98.7 F (37.1 C)] 98.6 F (37 C) (05/05 0438) Pulse Rate:  [71-79] 74 (05/05 0438) Cardiac Rhythm:  [-] Normal sinus rhythm;Bundle branch block (05/04 1935) Resp:  [18-20] 20 (05/05 0438) BP: (115-131)/(62-79) 118/62 mmHg (05/05 0438) SpO2:  [89 %-98 %] 97 % (05/05 0438) Weight:  [78.4 kg (172 lb 13.5 oz)] 78.4 kg (172 lb 13.5 oz) (05/05 0438)  Pre op weight  76 kg Current Weight  08/23/12 78.4 kg (172 lb 13.5 oz)      Intake/Output from previous day: 05/04 0701 - 05/05 0700 In: 50 [IV Piggyback:50] Out: 1050 [Urine:1050]   Physical Exam:  Cardiovascular: RRR, no murmurs, gallops, or rubs. Pulmonary: Clear to auscultation bilaterally; no rales, wheezes, or rhonchi. Abdomen: Soft, non tender, bowel sounds present. Extremities: Mild bilateral lower extremity edema. Wounds: Clean and dry.  No erythema or signs of infection.  Lab Results: CBC: Recent Labs  08/21/12 0500 08/22/12 0505  WBC 19.6* 16.0*  HGB 10.1* 9.4*  HCT 28.5* 27.1*  PLT 181 234   BMET:  Recent Labs  08/22/12 0505 08/23/12 0448  NA 136 136  K 4.1 4.3  CL 101 101  CO2 25 26  GLUCOSE 87 90  BUN 30* 27*  CREATININE 1.37* 1.39*  CALCIUM 9.0 8.8    PT/INR:  Lab Results  Component Value Date   INR 1.42 08/17/2012   ABG:  INR: Will add last result for INR, ABG once components are confirmed Will add last 4 CBG results once components are confirmed  Assessment/Plan:  1. CV - Previous A fib with RVR. SR this am. On Amiodarone 400 bid, Norvasc 10 daily, and Bystolic 5 bid. 2.  Pulmonary - On 1  liter of oxygen and wean as tolerates.Encourage incentive spirometer 3. Volume Overload - Continue with Lasix 4.  Acute blood loss anemia - Last H and H 9.4 and 27.1 5.GU-Creatine remains 1.39. S/p traumatic foley insertion. Continue Flomax. On Cipro empirically. 6.SNF when ready for discharge  ZIMMERMAN,DONIELLE MPA-C 08/23/2012,8:07 AM  Should be ready for SNF tomorrow if maintains SR. WBC decreasing on Cipro, await cultures, sensitivities

## 2012-08-23 NOTE — Progress Notes (Addendum)
CARDIAC REHAB PHASE I   PRE:  Rate/Rhythm: 75 SR  BP:  Supine:   Sitting: 110/66  Standing:    SaO2: 99 2L  MODE:  Ambulation: 350 ft   POST:  Rate/Rhythm: 83  BP:  Supine:   Sitting: 128/70  Standing:    SaO2: 100 RA 0940-1005 Assisted X 1 and used walker to ambulate. Gait steady with walker. Pt encouraged to ambulate and able to get him 350 feet. Room air sat after walk 100%, O2 left off. Pt back to recliner after walk with call light in reach. Encouraged use of IS hourly.  Melina Copa RN 08/23/2012 10:03 AM

## 2012-08-24 DIAGNOSIS — I25810 Atherosclerosis of coronary artery bypass graft(s) without angina pectoris: Secondary | ICD-10-CM | POA: Diagnosis present

## 2012-08-24 LAB — GLUCOSE, CAPILLARY: Glucose-Capillary: 132 mg/dL — ABNORMAL HIGH (ref 70–99)

## 2012-08-24 NOTE — Progress Notes (Signed)
Pt refused several medications this AM. Pt stated "I only want my two blood pressure medications and one for my prostate." Explained importance of taking other medications but patient continued to refuse. Documented refusal in [facility]. Pt also refusing blood sugar monitoring. Clayton Austin

## 2012-08-24 NOTE — Progress Notes (Signed)
    SUBJECTIVE: Only c/o hiccups this am.   BP 136/64  Pulse 76  Temp(Src) 97.7 F (36.5 C) (Oral)  Resp 18  Ht 5\' 11"  (1.803 m)  Wt 174 lb 6.4 oz (79.107 kg)  BMI 24.33 kg/m2  SpO2 94%  Intake/Output Summary (Last 24 hours) at 08/24/12 0752 Last data filed at 08/24/12 0240  Gross per 24 hour  Intake    363 ml  Output   1250 ml  Net   -887 ml    PHYSICAL EXAM General: Well developed, well nourished, in no acute distress. Alert and oriented x 3.  Psych:  Good affect, responds appropriately Neck: No JVD. No masses noted.  Lungs: Clear bilaterally with no wheezes or rhonci noted.  Heart: RRR with no murmurs noted. Abdomen: Bowel sounds are present. Soft, non-tender.  Extremities: No lower extremity edema.   LABS: Basic Metabolic Panel:  Recent Labs  13/24/40 0505 08/23/12 0448  NA 136 136  K 4.1 4.3  CL 101 101  CO2 25 26  GLUCOSE 87 90  BUN 30* 27*  CREATININE 1.37* 1.39*  CALCIUM 9.0 8.8  MG 2.1  --    CBC:  Recent Labs  08/22/12 0505  WBC 16.0*  HGB 9.4*  HCT 27.1*  MCV 86.6  PLT 234   Current Meds: . amiodarone  400 mg Oral Q12H   Followed by  . [START ON 08/29/2012] amiodarone  400 mg Oral Daily  . amLODipine  10 mg Oral Daily  . aspirin EC  325 mg Oral Daily   Or  . aspirin  324 mg Per Tube Daily  . atorvastatin  20 mg Oral Daily  . bisacodyl  10 mg Oral Daily   Or  . bisacodyl  10 mg Rectal Daily  . ciprofloxacin  500 mg Oral BID  . docusate sodium  200 mg Oral Daily  . enoxaparin (LOVENOX) injection  40 mg Subcutaneous QHS  . furosemide  40 mg Oral Daily  . insulin aspart  0-15 Units Subcutaneous TID WC  . multivitamin with minerals  1 tablet Oral Daily  . nebivolol  5 mg Oral BID  . pantoprazole  40 mg Oral Daily  . potassium chloride  20 mEq Oral BID  . sodium chloride  3 mL Intravenous Q12H  . tamsulosin  0.4 mg Oral QPC breakfast     ASSESSMENT AND PLAN:  1. Severe triple vessel CAD with left main disease/unstable angina:  now s/p 5V CABG 08/17/12. Doing well. Continue ASA, statin, beta blocker. He has been ambulating and doing well.   2. Post-op atrial fibrillation: Remains in sinus today on amiodarone. Continue amiodarone BID for now then reduce to once daily on 08/29/12. . Will readdress dosing/need for continuation of therapy at f/u visit.   Follow up can be arranged in our [facility] office with Dr. Simona Huh.     Clayton Austin  5/6/20147:52 AM

## 2012-08-24 NOTE — Progress Notes (Signed)
Pt refused Am labs.  Clayton Austin M

## 2012-08-24 NOTE — Progress Notes (Signed)
Pt ambulated 550 ft with rolling walker. Tolerated well. Back to chair. Clayton Austin

## 2012-08-24 NOTE — Progress Notes (Signed)
CARDIAC REHAB PHASE I   PRE:  Rate/Rhythm: 86SR  BP:  Supine:   Sitting: 120/80  Standing:    SaO2: 96%RA  MODE:  Ambulation: 550 ft   POST:  Rate/Rhythm: 86SR  BP:  Supine:   Sitting: 140/80  Standing:    SaO2: would not register, fingers cold 1005-1032 Pt walked 550 ft on RA with rolling walker and minimal asst. Tolerated increase in distance well. To bed after walk for pacing wire removal. Pt c/o hiccups prior to walk but did not hear them after walk. Re enforced sternal precautions. Call bell given.   Luetta Nutting, RN BSN  08/24/2012 10:29 AM

## 2012-08-24 NOTE — Clinical Social Work Note (Signed)
Clinical Social Work Department BRIEF PSYCHOSOCIAL ASSESSMENT 08/24/2012  Patient:  Clayton Austin, Clayton Austin     Account Number:  [id number]     Admit date:  08/16/2012  Clinical Social Worker:  Verl Blalock  Date/Time:  08/24/2012 11:00 AM  Referred by:  Care Management  Date Referred:  08/24/2012 Referred for  SNF Placement   Other Referral:   Interview type:  Patient Other interview type:   No family currently present at bedside    PSYCHOSOCIAL DATA Living Status:  WIFE Admitted from facility:   Level of care:   Primary support name:  Gayland, Nicol   [phone] Primary support relationship to patient:  SPOUSE Degree of support available:   Strong    CURRENT CONCERNS Current Concerns  Post-Acute Placement   Other Concerns:    SOCIAL WORK ASSESSMENT / PLAN Clinical Social Worker met with patient at bedside to offer support and discuss patient needs at discharge.  Patient states that he lives at home with his wife and would love to go back home, but understands that his wife works and is unable to provide the 24 hour support needed at discharge. Patient is agreeable to SNF placement in [facility] only.  CSW has initiated search and patient has verbalized preference to [facility].  CSW notified facility who has extended the bed offer and awaiting authorization from [facility].  CSW notified patient that if insurance approval is not obtained he will have to pay privately or discharge home with as much support as possible.  Patient seems to understand and states that he will notify family for update in regards to discharge disposition.  CSW remains available for support and to facilitate patient discharge needs once medically stable and insurance authorization obtained.   Assessment/plan status:  Psychosocial Support/Ongoing Assessment of Needs Other assessment/ plan:   Information/referral to community resources:   Patient states that he is familiar with local  facilities and does not need a facility list at this time.  CSW provided knowledge regarding facilities in network with [employer].    PATIENT'S/FAMILY'S RESPONSE TO PLAN OF CARE: Patient alert and oriented x3 laying in bed eating lunch. Patient states "I have the hiccups so bad I can't eat." Patient is in agreement with SNF placement with preference to [facility].  Patient family will be updated by patient per his request.  Patient verbalized his understanding in discharge options and awaiting approval from insurance provider.   Macario Golds, [place] [phone]

## 2012-08-24 NOTE — Clinical Social Work Note (Addendum)
Clinical Social Work Department CLINICAL SOCIAL WORK PLACEMENT NOTE 08/24/2012  Patient:  Clayton Austin, Clayton Austin  Account Number:  [id number] Admit date:  08/16/2012  Clinical Social Worker:  Macario Golds, LCSW  Date/time:  08/24/2012 12:00 N  Clinical Social Work is seeking post-discharge placement for this patient at the following level of care:   SKILLED NURSING   (*CSW will update this form in Epic as items are completed)   08/24/2012  Patient/family provided with [facility] Department of Clinical Social Work's list of facilities offering this level of care within the geographic area requested by the patient (or if unable, by the patient's family).  08/24/2012  Patient/family informed of their freedom to choose among providers that offer the needed level of care, that participate in Medicare, Medicaid or managed care program needed by the patient, have an available bed and are willing to accept the patient.  08/24/2012  Patient/family informed of [facility]' ownership interest in [facility], as well as of the fact that they are under no obligation to receive care at this facility.  PASARR submitted to EDS on 08/24/2012 PASARR number received from EDS on 08/24/2012  FL2 transmitted to all facilities in geographic area requested by pt/family on  08/24/2012 FL2 transmitted to all facilities within larger geographic area on   Patient informed that his/her managed care company has contracts with or will negotiate with  certain facilities, including the following:     Patient/family informed of bed offers received:  08/24/2012 Patient chooses bed at [facility] Physician recommends and patient chooses bed at    Patient to be transferred to [facility] on 08/25/2012   Patient to be transferred to facility by Ambulance  The following physician request were entered in Epic:   Additional Comments: 05/06 Patient awaiting insurance authorization from [facility]

## 2012-08-24 NOTE — Progress Notes (Signed)
EPW d/c at 1115 per MD order. All wires intact upon removal. No arrhythmias noted. VSS. Bed rest till 1215. Call bell in reach. Dion Saucier

## 2012-08-24 NOTE — Progress Notes (Signed)
Pt refused AM blood sugar check.  Shaana Acocella M

## 2012-08-24 NOTE — Progress Notes (Addendum)
                    [address]            [place] [address]          [phone]     7 Days Post-Op Procedure(s) (LRB): CYSTOSCOPY FLEXIBLE for difficult  foley catheter insertion (N/A) CORONARY ARTERY BYPASS GRAFTING (CABG) (N/A) URETHRA DILATATION (N/A)  Subjective: On bedside commode. C/o hiccups.  Objective: Vital signs in last 24 hours: Patient Vitals for the past 24 hrs:  BP Temp Temp src Pulse Resp SpO2 Weight  08/24/12 0323 136/64 mmHg 97.7 F (36.5 C) Oral 76 18 94 % 174 lb 6.4 oz (79.107 kg)  08/23/12 1922 126/65 mmHg 97.5 F (36.4 C) Oral 78 - 96 % -  08/23/12 1335 128/64 mmHg 97.1 F (36.2 C) Oral 78 20 93 % -  08/23/12 1045 104/57 mmHg - - - - - -   Current Weight  08/24/12 174 lb 6.4 oz (79.107 kg)  Pre op weight 76 kg    Intake/Output from previous day: 05/05 0701 - 05/06 0700 In: 363 [P.O.:360; I.V.:3] Out: 1250 [Urine:1250]    PHYSICAL EXAM:  Heart: RRR Lungs: Clear Wound: Clean and dry Extremities: Mild LE edema    Lab Results: CBC: Recent Labs  08/22/12 0505  WBC 16.0*  HGB 9.4*  HCT 27.1*  PLT 234   BMET:  Recent Labs  08/22/12 0505 08/23/12 0448  NA 136 136  K 4.1 4.3  CL 101 101  CO2 25 26  GLUCOSE 87 90  BUN 30* 27*  CREATININE 1.37* 1.39*  CALCIUM 9.0 8.8    PT/INR: No results found for this basename: LABPROT, INR,  in the last 72 hours    Assessment/Plan: S/P Procedure(s) (LRB): CYSTOSCOPY FLEXIBLE for difficult  foley catheter insertion (N/A) CORONARY ARTERY BYPASS GRAFTING (CABG) (N/A) URETHRA DILATATION (N/A) CV- AF, maintaining SR. Continue po Amio, Norvasc, Bystolic. BPs stable.  GU- difficult Foley insertion. Continue Foley x 2 weeks per urology. Back on Flomax.  Cr stable, continue to watch. Leukocytosis- WBC trending down, no new labs today. Urine Cx negative. On Cipro D#4. ?need to complete course of abx in light of traumatic Foley insertion. CRPI/PT.  Disp- to SNF, possibly  today if bed available.  LOS: 8 days    COLLINS,GINA H 08/24/2012  Patient seen and examined. Agree with above Would finish 5 day course of cipro (day 4 today)

## 2012-08-25 ENCOUNTER — Other Ambulatory Visit: Payer: Self-pay | Admitting: *Deleted

## 2012-08-25 ENCOUNTER — Inpatient Hospital Stay
Admission: RE | Admit: 2012-08-25 | Discharge: 2012-09-15 | Disposition: A | Discharge status: Home / Self Care | Payer: Medicare PPO | Source: Ambulatory Visit | Attending: Internal Medicine | Admitting: Internal Medicine

## 2012-08-25 DIAGNOSIS — R918 Other nonspecific abnormal finding of lung field: Secondary | ICD-10-CM

## 2012-08-25 DIAGNOSIS — D72829 Elevated white blood cell count, unspecified: Principal | ICD-10-CM

## 2012-08-25 LAB — GLUCOSE, CAPILLARY
Glucose-Capillary: 92 mg/dL (ref 70–99)
Glucose-Capillary: 105 mg/dL — ABNORMAL HIGH (ref 70–99)

## 2012-08-25 MED ORDER — ASPIRIN 325 MG PO TBEC: 325.0000 mg | DELAYED_RELEASE_TABLET | Freq: Every day | ORAL | Status: DC

## 2012-08-25 MED ORDER — POTASSIUM CHLORIDE CRYS ER 20 MEQ PO TBCR: 20.0000 meq | EXTENDED_RELEASE_TABLET | Freq: Every day | ORAL | Status: DC

## 2012-08-25 MED ORDER — AMIODARONE HCL 400 MG PO TABS: 400.0000 mg | ORAL_TABLET | Freq: Every day | ORAL | Status: DC

## 2012-08-25 MED ORDER — OXYCODONE HCL 5 MG PO TABS: 5.0000 mg | ORAL_TABLET | ORAL | Status: DC | PRN

## 2012-08-25 MED ORDER — AMIODARONE HCL 400 MG PO TABS: 400.0000 mg | ORAL_TABLET | Freq: Two times a day (BID) | ORAL | Status: DC

## 2012-08-25 MED ORDER — FUROSEMIDE 40 MG PO TABS: 40.0000 mg | ORAL_TABLET | Freq: Every day | ORAL | Status: DC

## 2012-08-25 NOTE — Progress Notes (Signed)
Physical Therapy Treatment Patient Details Name: Clayton Austin MRN: [id number] DOB: [birth year] Today's Date: 08/25/2012 Time: 7829-5621 PT Time Calculation (min): 23 min  PT Assessment / Plan / Recommendation Comments on Treatment Session  Pt cont's to make steady progress & would cont to benefit from ST-SNF at d/c to maximize functional recovery & independence with mobility.      Follow Up Recommendations  SNF     Does the patient have the potential to tolerate intense rehabilitation     Barriers to Discharge        Equipment Recommendations       Recommendations for Other Services    Frequency Min 3X/week   Plan Discharge plan remains appropriate;Frequency remains appropriate    Precautions / Restrictions Precautions Precautions: Sternal;Fall Precaution Comments: Pt unable to recall sternal precautions.  He was re-educated on them, and need for compliance Restrictions Weight Bearing Restrictions: No    Pertinent Vitals/Pain Pt reports feeling mildly SOB with ambulation but did not appear so to this clinician (1/4 dyspnea).  Checked 02 sats while ambulating; Dynamap read 77% RA; returned back to room; Rechecked immediately upon returning to room with different dynamap & 02 sats 91% RA;  Pt at 96% RA at end of session.     Mobility  Bed Mobility Bed Mobility: Not assessed Transfers Transfers: Sit to Stand;Stand to Sit Sit to Stand: 4: Min assist;From chair/3-in-1 Stand to Sit: 4: Min guard;To chair/3-in-1 Details for Transfer Assistance: Performed 5x's for strengthening, activity tolerance, & technique.  Cues to reinforce no use of UE's.  Used rocking motion to gain momentum to achieve standing while hugging pillow.  (A) varied between Min Guard/Min (A).   (A) to achieve standing & balance.   Ambulation/Gait Ambulation/Gait Assistance: 5: Supervision Ambulation Distance (Feet): 150 Feet Assistive device: Rolling walker Ambulation/Gait Assistance Details: Pt steady.   Reports he feels mildly SOB; 02 sats read 77% RA in hallway but pt did not appear to be SOB.  02 sats rechecked immediately upon returning to room with different dynamap & sats at 91% RA.   Gait Pattern: Step-through pattern     PT Goals Acute Rehab PT Goals PT Goal Formulation: With patient Time For Goal Achievement: 09/03/12 Potential to Achieve Goals: Good Pt will go Supine/Side to Sit: with modified independence Pt will go Sit to Stand: with modified independence PT Goal: Sit to Stand - Progress: Progressing toward goal Pt will Ambulate: with modified independence PT Goal: Ambulate - Progress: Progressing toward goal Pt will Go Up / Down Stairs: 6-9 stairs;with min assist  Visit Information  Last PT Received On: 08/25/12 Assistance Needed: +1    Subjective Data      Cognition  Cognition Arousal/Alertness: Awake/alert Behavior During Therapy: WFL for tasks assessed/performed Overall Cognitive Status: Within Functional Limits for tasks assessed    Balance  Static Standing Balance Static Standing - Balance Support: No upper extremity supported Static Standing - Level of Assistance: 5: Stand by assistance  End of Session PT - End of Session Equipment Utilized During Treatment: Gait belt Activity Tolerance: Patient tolerated treatment well Patient left: in chair;with call bell/phone within reach Nurse Communication: Mobility status     Verdell Face, [place] [phone] 08/25/2012

## 2012-08-25 NOTE — Progress Notes (Addendum)
Patient alert and oriented x4.  Pt's family at bedside.  Pt IV access removed cannula intact site clean, dry and intact; no redness, swelling, drainage to site.  Pt tolerated removal well.  Packet given to EMT personnel.  Pt discharged with all belongings and foley in place via ambulance to [facility].

## 2012-08-25 NOTE — Progress Notes (Signed)
CARDIAC REHAB PHASE I   PRE:  Rate/Rhythm: 73SR  BP:  Supine:   Sitting: 110/60  Standing:    SaO2: 93%RA  MODE:  Ambulation: 550 ft   POST:  Rate/Rhythm: 83SR  BP:  Supine:   Sitting: 120/50  Standing:    SaO2: 92-93%RA 1307-1340 Pt walked 550 ft on RA with rolling walker and asst x 1. Gait steady. Checked sats during walk at 92-92%RA. To bed after walk. Reviewed sternal precautions,IS,and importance of walking. Call bell in reach.   Luetta Nutting, RN BSN  08/25/2012 1:36 PM

## 2012-08-25 NOTE — Clinical Social Work Note (Signed)
Clinical Social Worker continuing to follow patient for support and discharge planning needs.  Patient has received [facility] authorization for placement at [facility].  CSW will await discharge summary to facilitate patient discharge needs.  Macario Golds, [place] [phone]

## 2012-08-25 NOTE — Progress Notes (Signed)
Report given to Kathie Rhodes, Charity fundraiser at [employer] in [place].

## 2012-08-25 NOTE — Progress Notes (Addendum)
                    [address]            [place] [address]          [phone]     8 Days Post-Op Procedure(s) (LRB): CYSTOSCOPY FLEXIBLE for difficult  foley catheter insertion (N/A) CORONARY ARTERY BYPASS GRAFTING (CABG) (N/A) URETHRA DILATATION (N/A)  Subjective: Feels well, no complaints.   Objective: Vital signs in last 24 hours: Patient Vitals for the past 24 hrs:  BP Temp Temp src Pulse Resp SpO2 Weight  08/25/12 0443 106/68 mmHg 98.3 F (36.8 C) Oral 74 18 99 % 173 lb 6.4 oz (78.654 kg)  08/24/12 1956 134/73 mmHg 97.6 F (36.4 C) Oral 89 18 100 % -  08/24/12 1345 105/67 mmHg 97.9 F (36.6 C) Oral 89 17 92 % -  08/24/12 1226 128/53 mmHg - - 84 - - -  08/24/12 1124 141/72 mmHg - - 79 - - -  08/24/12 1100 149/80 mmHg - - 84 - - -   Current Weight  08/25/12 173 lb 6.4 oz (78.654 kg)  Pre op weight 76 kg    Intake/Output from previous day: 05/06 0701 - 05/07 0700 In: 600 [P.O.:600] Out: 951 [Urine:950; Stool:1]    PHYSICAL EXAM:  Heart: RRR Lungs: Clear Wound: Clean and dry Extremities: Trace LE edema    Lab Results: CBC:No results found for this basename: WBC, HGB, HCT, PLT,  in the last 72 hours BMET:  Recent Labs  08/23/12 0448  NA 136  K 4.3  CL 101  CO2 26  GLUCOSE 90  BUN 27*  CREATININE 1.39*  CALCIUM 8.8    PT/INR: No results found for this basename: LABPROT, INR,  in the last 72 hours    Assessment/Plan: S/P Procedure(s) (LRB): CYSTOSCOPY FLEXIBLE for difficult  foley catheter insertion (N/A) CORONARY ARTERY BYPASS GRAFTING (CABG) (N/A) URETHRA DILATATION (N/A)  CV- postop AF, now maintaining SR. Continue po Amio, Norvasc, Bystolic. BPs stable.   GU- difficult Foley insertion. Continue Foley x 2 weeks per urology. Back on Flomax.   Cr stable, continue to watch.   Probable UTI, finish course of Cipro, D#5/5.  Disp- medically ready for transfer to SNF when bed available.    LOS: 9 days     COLLINS,GINA H 08/25/2012  Patient seen and examined. Agree with above. He will need a chest CT at follow up to assess possible lung nodule seen in preop CXR

## 2012-08-25 NOTE — Clinical Social Work Note (Signed)
Clinical Social Worker facilitated patient discharge including confirming patient discharge plans with patient and facility.  Facility has received insurance authorization and agreeable to placement.  Patient adamantly states that he has notified family and requests CSW not make contact as well.  CSW arranged ambulance transport via [facility] to [facility].  RN to call report prior to discharge.  Clinical Social Worker will sign off for now as social work intervention is no longer needed. Please consult [place] again if new need arises.  Macario Golds, [place] [phone]

## 2012-08-26 ENCOUNTER — Other Ambulatory Visit: Payer: Self-pay | Admitting: Geriatric Medicine

## 2012-08-26 ENCOUNTER — Non-Acute Institutional Stay (SKILLED_NURSING_FACILITY): Payer: Medicare PPO | Admitting: Internal Medicine

## 2012-08-26 ENCOUNTER — Encounter: Payer: Self-pay | Admitting: Internal Medicine

## 2012-08-26 DIAGNOSIS — I4891 Unspecified atrial fibrillation: Secondary | ICD-10-CM

## 2012-08-26 DIAGNOSIS — R066 Hiccough: Secondary | ICD-10-CM

## 2012-08-26 DIAGNOSIS — I25810 Atherosclerosis of coronary artery bypass graft(s) without angina pectoris: Secondary | ICD-10-CM

## 2012-08-26 DIAGNOSIS — N401 Enlarged prostate with lower urinary tract symptoms: Secondary | ICD-10-CM | POA: Insufficient documentation

## 2012-08-26 DIAGNOSIS — B370 Candidal stomatitis: Secondary | ICD-10-CM

## 2012-08-26 DIAGNOSIS — R609 Edema, unspecified: Secondary | ICD-10-CM

## 2012-08-26 DIAGNOSIS — N400 Enlarged prostate without lower urinary tract symptoms: Secondary | ICD-10-CM

## 2012-08-26 DIAGNOSIS — E785 Hyperlipidemia, unspecified: Secondary | ICD-10-CM | POA: Insufficient documentation

## 2012-08-26 DIAGNOSIS — I10 Essential (primary) hypertension: Secondary | ICD-10-CM | POA: Insufficient documentation

## 2012-08-26 DIAGNOSIS — D72829 Elevated white blood cell count, unspecified: Secondary | ICD-10-CM

## 2012-08-26 MED ORDER — OXYCODONE HCL 5 MG PO TABS: 5.0000 mg | ORAL_TABLET | ORAL | Status: DC | PRN

## 2012-08-27 ENCOUNTER — Ambulatory Visit (HOSPITAL_COMMUNITY)
Admit: 2012-08-27 | Discharge: 2012-08-27 | Disposition: A | Discharge status: Home / Self Care | Payer: Medicare PPO | Attending: Internal Medicine | Admitting: Internal Medicine

## 2012-08-27 ENCOUNTER — Non-Acute Institutional Stay (SKILLED_NURSING_FACILITY): Payer: Medicare PPO | Admitting: Internal Medicine

## 2012-08-27 DIAGNOSIS — E039 Hypothyroidism, unspecified: Secondary | ICD-10-CM

## 2012-08-27 DIAGNOSIS — D649 Anemia, unspecified: Secondary | ICD-10-CM

## 2012-08-27 DIAGNOSIS — R609 Edema, unspecified: Secondary | ICD-10-CM

## 2012-08-27 DIAGNOSIS — I10 Essential (primary) hypertension: Secondary | ICD-10-CM

## 2012-08-27 DIAGNOSIS — I4891 Unspecified atrial fibrillation: Secondary | ICD-10-CM

## 2012-08-27 DIAGNOSIS — R066 Hiccough: Secondary | ICD-10-CM

## 2012-08-27 DIAGNOSIS — R1110 Vomiting, unspecified: Secondary | ICD-10-CM

## 2012-08-27 DIAGNOSIS — N289 Disorder of kidney and ureter, unspecified: Secondary | ICD-10-CM

## 2012-08-27 DIAGNOSIS — I25810 Atherosclerosis of coronary artery bypass graft(s) without angina pectoris: Secondary | ICD-10-CM

## 2012-08-27 DIAGNOSIS — D72829 Elevated white blood cell count, unspecified: Secondary | ICD-10-CM

## 2012-08-29 DIAGNOSIS — B370 Candidal stomatitis: Secondary | ICD-10-CM | POA: Insufficient documentation

## 2012-08-29 DIAGNOSIS — R066 Hiccough: Secondary | ICD-10-CM | POA: Insufficient documentation

## 2012-08-29 DIAGNOSIS — D72829 Elevated white blood cell count, unspecified: Secondary | ICD-10-CM | POA: Insufficient documentation

## 2012-08-29 DIAGNOSIS — R609 Edema, unspecified: Secondary | ICD-10-CM | POA: Insufficient documentation

## 2012-08-29 NOTE — Progress Notes (Signed)
Patient ID: Clayton Austin, male   DOB: 03/08/1941, 72 y.o.   MRN: [id number]  Chief complaint-acute visit status post hospitalization for CABG secondary to coronary artery disease.  History of present illness.  Patient is a pleasant 72 year old male with a history of hypertension hyperlipidemia.  Were no prior cardiac history.  Apparently he started experiencing exertional chest pain about 4 weeks ago.  He went to the ED after an episode that was more severe and was admitted for an mildly positive troponin.  He was transferred to [facility] he had a cardiac catheterization--it revealed severe left main and three-vessel coronary artery disease and a CABG was recommended.  He apparently tolerated the procedure well.  Appears he may have developed some atrial fibrillation he is on amiodarone which is being titrated down also on a beta blocker  \He is also on a short 5 day course of Lasix I suspect this is for edema question CHF concerns   apparently he did have some urinary retention and there was considerable difficulty placing a Foley catheter-he was found to have a urethral stricture which required an emergent cystoscopy with urethral dilation and placement of the catheter  He did have a urinalysis done that showed bacteremia-and was started on Cipro however the culture did not grow out anything.  Nonetheless he was kept on antibiotic secondary to catheter placement and concerns about leukocytosis which he was experiencing.  Currently he does not complain of any acute chest pain says he has some soreness occasionally when he coughs secondary to the surgical wound.  He denies any fever or chills.  His main complaint is   hiccups he says this developed during his hospitalization  Previous medical history.  Coronary artery disease status post CABG as noted above.  Hypertension.  A. fib  Hyperlipidemia.  BPH.---  History of urethral stricture  Leukocytosis  Family  medical social history. Father with a history of coronary artery disease with an MI at age 24.  Social history.  No longer smokes he has a 5-pack-year smoking history previously.  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  Medications.  Amiodarone 400 mg twice a day until May 11 when it will be reduced to 400 mg a day.  Norvasc 10 mg daily.  Aspirin 325 mg daily.  Lipitor 20 mg daily.  Lasix 40 mg daily for 5 additional days.  Multivitamin daily.  Bystolic 5 mg daily  OxyIR 5 mg every 4 hours as needed.  Potassium 20 mEq daily for 5 days.   Flomax 0.5 mg every morning.  .   Review of systems.  In general denies any fever or chills.  Skin-denies issues--surgical wound in the chest apparently is stable.  Head ears eyes nose mouth and throat-denies any visual changes or sore throat.  He does complain of hiccups  Respiratory-does not complain of increased shortness of breath--has an occasional cough.   Cardiac-denies any chest pain except he says occasionally when he coughs he says he will feel a little discomfort because of the coughing.  GI-is not really complaining of any abdominal pain nausea or vomiting.  GU-as stated history of present illness does have a an indwelling Foley catheter denies any burning with urination however.  Muscle skeletal-does have some weakness status post surgery but denies any joint pain.  Neurologic-denies any headache or dizziness.  Psych does not appear to have a significant history of depression or anxiety.  Physical  exam.  Temperature 98.3 pulse 79 respirations 20 blood pressure 115/55.  In general this is a somewhat frail elderly male in no distress sitting comfortably in his wheelchair.  His skin is warm and dry surgical site midchest -- crusting with no drainage or surrounding erythema slight tenderness to palpation of the area this looks rather benign  Eyes  pupils appear equal round reactive to light sclerae are clear.  Oropharynx mucous membranes moist he does appear to have a white film on the top.   chest is clear to auscultation--with somewhat reduced breath sounds-no labored breathing.  Marland Kitchen Heart-is regular rate and rhythm without murmur gallop or rub.  Abdomen is obese soft nontender with positive bowel sounds  GU-does have a Foley catheter draining somewhat dark amber colored urine.  Muscle skeletal does move all extremities x4 I do not note any deformities has some lower extremity weakness- debilitation-he has trace lower extremity edema bilaterally--- he is able to stand and support himself.  Neurologic-t normal lateralizing findings-cranial nerves appear grossly intact speech is clear.  Psych he is alert and oriented x3 pleasant and appropriate.  Labs.  08/23/2012.  Sodium 136 potassium 4.3 BUN 27 creatinine 1.39.  08/22/2012.  WBC 16.0 hemoglobin 9.4 platelets 234.  Magnesium 2.1.  08/21/2012.  Liver function tests within normal limits except albumin of 3.1.  [id number].  Assessment and plan.  #1-coronary artery disease-status post CABG-at this point appears to be stable he does not complaining of chest pain or increased shortness of breath he continues on aspirin as well as a statin and a beta blocker.  #2--edema? Continues on Lasix short term with potassium supplementation continue to monitor I do not see evidence of CHF exacerbation at this time.--Will update metabolic panel I note creatinine appears to be rising slightly per review of hospital records  #3-atrial fibrillation-he is on amiodarone as well as aspirin and beta blocker-this appears to be rate controlled-continue to monitor.  #4-hypothyroidism-TSH elevated in hospital this may be due to the amiodarone- Suspect we may be starting Synthroid.--Will recheck TSH  #5-candidiasis oral-Will start nystatin swish and spit 4 times a day for 5 days and  monitor.  #6-hiccups-- situation challenging-- consider Thorazine--  . #7-hypertension-this point appears to be stable on Norvasc as well as beta blocker.  #8-urinary issue--BPH s-continues with Foley catheter for now he is status post cystoscopy for catheter placement with urethral stricture-.  #9-leukocytosis-this appeared to be trending down per review of hospital labs-Will update CBC tomorrow.  #10-hyperlipidemia-continue simvastatin recent liver function tests were fairly unremarkable  Addendum.  Apparently patient has vomited a couple times later today-Will start a clear liquid diet 24 hours and titrate up as tolerated--he continues to deny any associated shortness of breath or abdominal pain-continue to monitor vital signs every shift.  [id number] -of note greater than 50 minutes spent assessing patient-and formulating a plan of care for numerous diagnoses as well as extensive review of hospital records       .

## 2012-08-30 ENCOUNTER — Non-Acute Institutional Stay (SKILLED_NURSING_FACILITY): Payer: Medicare PPO | Admitting: Internal Medicine

## 2012-08-30 DIAGNOSIS — I25810 Atherosclerosis of coronary artery bypass graft(s) without angina pectoris: Secondary | ICD-10-CM

## 2012-08-30 DIAGNOSIS — I10 Essential (primary) hypertension: Secondary | ICD-10-CM

## 2012-08-30 DIAGNOSIS — N400 Enlarged prostate without lower urinary tract symptoms: Secondary | ICD-10-CM

## 2012-08-30 DIAGNOSIS — D72829 Elevated white blood cell count, unspecified: Secondary | ICD-10-CM

## 2012-08-30 LAB — GLUCOSE, CAPILLARY: Glucose-Capillary: 94 mg/dL (ref 70–99)

## 2012-08-30 NOTE — Progress Notes (Signed)
Patient ID: Clayton Austin, male   DOB: 1941/02/11, 72 y.o.   MRN: [id number] Chief complaint; admission to SNF postdate at home health, exact dates uncertain.  History; this is a 72 year old man who had a 3 week history of exertional chest pain. He presented ultimately to [facility] and was diagnosed with a non-ST elevation MI. He was transferred to [facility]. The pain initially was described as burning in the midsternal area occurring with moderate exertion. His cardiac cath revealed left main and three-vessel disease. He was referred for consideration of either. Past surgery, which was completed by Dr. Dorris Fetch. Postoperatively, he had the blood loss anemia, postoperative volume overload, his Foley catheter was recommended for another 2 weeks per urology. After that a voiding trial.  Past medical history/problem. #1 coronary artery disease, status post CABG #2 recent development of unstable angina. #3 hypertension. #4 hyperlipidemia. #5 BPH status post TURP apparently in the summer of 2013. Comes to [place] with a Foley catheter in place again for a further 2 weeks.  Medication 70 amiodarone 400 mg a day, Lasix, 40 mg a day for 5 days, KCl 20 mEq a day for 5 days, enteric-coated aspirin 325 by mouth daily, amlodipine, 10 mg by mouth daily, atorvastatin 20 mg by mouth daily, diastolic 5 mg by mouth daily, Flomax 0.4, by mouth daily.  Social history; functional man lives in [place] with his wife. 5 pack year smoking history. He has quit  family history includes Coronary artery disease (age of onset: 77) in his father.    Review of systems; Respiratory no cough no shortness of breath. Cardiac he is complaining of chest pain at rest, which I think is mostly incisional. No exertional chest symptoms, and he has already started to work with therapy. GI anorexia, and refractory hiccups have resolved since last week. GU Foley catheter in place as noted above.  Physical exam temperature 97.9,  pulse 83, respirations 20, blood pressure 122/78. Gen. in no distress. Respiratory clear entry, no wheezing no crackle. Her breathing is normal. Cardiac heart sounds are normal. No S4. No S3, JVP is not elevated. No edema. Abdomen; no tenderness. No masses. No liver no spleen. Chest incision is well-healed. Extremities no edema.  Impression/plan #1 coronary artery disease, now status post CABG. He appears to be very stable. His pain I think is mostly surgical, I will review my next visit. #2 postoperative fluid overload/CHF. No evidence of this.  #3 hypertension. Will monitor while he is here. #4 hyperlipidemia. I doubt he will be here long enough for [place] to be concerned about this. #5 refractory hiccups, whatever cause this appears to resolve now on a PPI #6 leukocytosis; white blood count of 18.3, but reasonably normal differential. Urinalysis is clear. White count was 19.3 on May 9. He was supposed to have a chest x-ray will see if I can find these results. Source of this is not clear. #7 renal insufficiency. This appears to be mild, but I'm not sure of the chronicity

## 2012-09-01 ENCOUNTER — Non-Acute Institutional Stay (SKILLED_NURSING_FACILITY): Payer: Medicare PPO | Admitting: Internal Medicine

## 2012-09-01 DIAGNOSIS — I25810 Atherosclerosis of coronary artery bypass graft(s) without angina pectoris: Secondary | ICD-10-CM

## 2012-09-01 DIAGNOSIS — R0902 Hypoxemia: Secondary | ICD-10-CM

## 2012-09-06 ENCOUNTER — Non-Acute Institutional Stay (SKILLED_NURSING_FACILITY): Payer: Medicare PPO | Admitting: Internal Medicine

## 2012-09-06 DIAGNOSIS — I4891 Unspecified atrial fibrillation: Secondary | ICD-10-CM

## 2012-09-06 DIAGNOSIS — I25810 Atherosclerosis of coronary artery bypass graft(s) without angina pectoris: Secondary | ICD-10-CM

## 2012-09-06 NOTE — Progress Notes (Signed)
Patient ID: Clayton Austin, male   DOB: [birth year], 72 y.o.   MRN: [id number] Chief complaint; followup of CHF, coronary disease, anemia.  History [name] is a gentleman who came to [place] from [place] 2 weeks ago after undergoing a CABG x5 [Dr. Hendrickson,]. His postoperative course was complicated by CHF, elevated white count of uncertain etiology. He still has a Foley catheter in however, this is being followed by urology, and I gather this is going to be removed this week. Lab work from today shows his white count is down to 9.5, with normal differential. His creatinine is 1.6, 1 BNP at 622.8.    Review of systems ; Respiratory no shortness of breath. Cardiac no exertional chest pain or palpitations. GI no exertional nausea, vomiting. Musculoskeletal. Complaining of pain in his right arm, however, this is not exertional.   Physical exam. Resp clear entry bilaterally, no wheezing. No crackles. Cardiac heart sounds are normal. No murmurs. No S4. JVP is not elevated. Abdomen no tenderness. No liver no spleen. Musculoskeletal; I see no problems with his right arm. No arthritis. No tenderness. No vascular issue. No edema. No erythema.  Impression/plan Coronary artery disease; this is stable. I see no signs of CHF. Creatinine is a bit up to 1.61. I will repeat CHF. No evidence of this Leukocytosis. This has resolved. Foley catheter followed by urology.  Patient is already asking to go home. This week. I will check in with social services.

## 2012-09-07 ENCOUNTER — Non-Acute Institutional Stay (SKILLED_NURSING_FACILITY): Payer: Medicare PPO | Admitting: Internal Medicine

## 2012-09-07 DIAGNOSIS — R0902 Hypoxemia: Secondary | ICD-10-CM

## 2012-09-07 DIAGNOSIS — I25810 Atherosclerosis of coronary artery bypass graft(s) without angina pectoris: Secondary | ICD-10-CM

## 2012-09-07 NOTE — Progress Notes (Signed)
Patient ID: Clayton Austin, male   DOB: Dec 10, 1940, 72 y.o.   MRN: [id number]           PROGRESS NOTE  DATE:  09/01/2012  FACILITY: [facility]   LEVEL OF CARE:   SNF   Acute Visit   CHIEF COMPLAINT:  Desaturation with activity, other issues.   HISTORY OF PRESENT ILLNESS:  This is a very puzzling, 72 year-old man who presented to hospital with a 3-week history of exertional chest pain, ultimately diagnosed with a non-ST elevation MI. He was transferred to [facility].  He had left main and 3-vessel disease and underwent a successful CABG by Dr. Robby Sermon.  Postoperatively, he had blood loss anemia.  Had some degree of fluid overload.  He comes to [place] with a Foley catheter recommended for another two weeks per Urology.    I have discussed the issue with the patient.  He states that he had no pulmonary limitations.  He quit smoking 30 years ago.  Denies exertional shortness of breath, cough or wheezing.     LABORATORY DATA:  Lab work is back showing a white count of 16.3.  This is generally down from the hospital.  However, there he was 19.3 on May 9th.    Chest x-ray showed small bilateral pleural effusions.    A urinalysis is negative.     His comprehensive metabolic panel showed an albumin of 2.9, a sodium of 133, a creatinine of 1.38.    All of this appears to be very stable from lab work in the hospital.     REVIEW OF SYSTEMS:   CHEST/RESPIRATORY:  No exertional shortness of breath or cough.  Historically, he states he did not have any pulmonary limitations.   Quit smoking 30 years ago.   CARDIAC:   Had right-sided anterior shoulder pain today before he got out of bed.  However, this was not exertional and not really typically chest related.   GI:  No nausea, vomiting or diarrhea.  GU:  Still Foley catheter in.    PHYSICAL EXAMINATION:   VITAL SIGNS:   PULSE:  68 and strong.  RESPIRATIONS:  14.   GENERAL APPEARANCE:  The patient appears well.  He is sitting watching  TV. CHEST/RESPIRATORY:  Clear air entry bilaterally.   CARDIOVASCULAR:  CARDIAC:   Heart sounds are normal.  No gallops.  JVP is not elevated.   EDEMA/VARICOSITIES:  No edema.  GASTROINTESTINAL:  LIVER/SPLEEN/KIDNEYS:  No liver, no spleen.   ABDOMEN:  No tenderness.  MUSCULOSKELETAL:   EXTREMITIES:  No evidence of a DVT.  ASSESSMENT/PLAN:  Coronary artery disease.  He is still having some atypical chest pain, which is not really chest pain in this location.  It is certainly not exertional.  We will order an EKG for my review the next time I am here.  My level of suspicion is not really all that high.     Exertional hypoxemia.  I do not quite know what to make of this.  Chest x-ray and pulmonary exam seem reasonably unremarkable.    Urinary retention with a Foley in place.  If he is stable, I may try to pull this on Monday.    Leukocytosis.  Again, the cause of this is not completely clear, but it is trending downward and I will continue to watch for now.    I am going to resume his Lasix at 20 mg.  EKG for my review.  BMP, BNP and CBC next week  for my rounds.      CPT CODE: 16109

## 2012-09-07 NOTE — Progress Notes (Signed)
Patient ID: Clayton Austin, male   DOB: 09/29/1940, 72 y.o.   MRN: [id number] This is an acute visit.  Level of care skilled.  Facility is [facility]  Chief complaint-acute visit secondary to hypoxia during therapy.  History of present illness.  Patient is a pleasant elderly resident here for rehabilitation after undergoing a CABG x5 -originally was diagnosed with a non-ST elevation MI  Orest Dikes appears to be doing quite well-he does develop some hypoxia during physical therapy but he tells me doesn't really feel short of breath or experience chest pain.  When I evaluated him he was back in his room sitting in his wheelchair comfortably vital signs were stable oxygen saturation was in the 90s.  He does continue on amiodarone-aspirin-a statin-and Bystolic as well as low dose Lasix which was restarted recently at 20 mg a day    Family medical social history has been reviewed per H&P on 08/30/2012.  Medications have been reviewed.  Review of systems.  General denies any fever or chills.  Respiratory denies shortness breath or cough.  Cardiac denies chest pain.  Muscle skeletal-is not complaining of joint pain today.  Physical exam.  He is afebrile pulse of 82 respirations 19 blood pressure 135/73 O2 saturation in the 90s.  In general this is a somewhat frail elderly male in no distress sitting comfortably in his wheelchair.  The skin is warm and dry.  Chest is clear to auscultation without any signs of labored breathing.  Heart is regular rate and rhythm without murmur gallop or rub I do not really note significant edema  . Labs.  09/06/2012.  WBC 9.5 hemoglobin 9.3 platelets 565.  Sodium 135 potassium 3.8 BUN 18 creatinine 1.61.  BNP 6 22.8    Assessment and plan.  #1-hypoxia-this appears resolved I suspect this is exertional related patient is largely asymptomatic continue to monitor--clinically he appears to be doing quite well--- I. do not see signs of worsening  CHF or unstable coronary artery disease  At--this time  #2--renal insufficiency-creatinine has risen a bitpher recent lab-an update basic metabolic panel is pending  [id number]

## 2012-09-08 ENCOUNTER — Other Ambulatory Visit: Payer: Self-pay | Admitting: *Deleted

## 2012-09-08 DIAGNOSIS — I2510 Atherosclerotic heart disease of native coronary artery without angina pectoris: Secondary | ICD-10-CM

## 2012-09-14 ENCOUNTER — Inpatient Hospital Stay
Admit: 2012-09-14 | Discharge: 2012-09-14 | Disposition: A | Discharge status: Home / Self Care | Payer: Medicare PPO | Attending: Thoracic Surgery (Cardiothoracic Vascular Surgery) | Admitting: Thoracic Surgery (Cardiothoracic Vascular Surgery)

## 2012-09-14 ENCOUNTER — Encounter: Payer: Self-pay | Admitting: Thoracic Surgery (Cardiothoracic Vascular Surgery)

## 2012-09-14 ENCOUNTER — Ambulatory Visit (INDEPENDENT_AMBULATORY_CARE_PROVIDER_SITE_OTHER): Payer: Self-pay | Admitting: Thoracic Surgery (Cardiothoracic Vascular Surgery)

## 2012-09-14 ENCOUNTER — Other Ambulatory Visit: Payer: Self-pay | Admitting: *Deleted

## 2012-09-14 ENCOUNTER — Non-Acute Institutional Stay (SKILLED_NURSING_FACILITY): Payer: Medicare PPO | Admitting: Internal Medicine

## 2012-09-14 VITALS — BP 130/74 | HR 71 | Resp 16 | Ht 71.0 in | Wt 173.0 lb

## 2012-09-14 DIAGNOSIS — R911 Solitary pulmonary nodule: Secondary | ICD-10-CM

## 2012-09-14 DIAGNOSIS — I25810 Atherosclerosis of coronary artery bypass graft(s) without angina pectoris: Secondary | ICD-10-CM

## 2012-09-14 DIAGNOSIS — I4891 Unspecified atrial fibrillation: Secondary | ICD-10-CM

## 2012-09-14 DIAGNOSIS — I2510 Atherosclerotic heart disease of native coronary artery without angina pectoris: Secondary | ICD-10-CM

## 2012-09-14 DIAGNOSIS — D62 Acute posthemorrhagic anemia: Secondary | ICD-10-CM

## 2012-09-14 DIAGNOSIS — I10 Essential (primary) hypertension: Secondary | ICD-10-CM

## 2012-09-14 DIAGNOSIS — Z951 Presence of aortocoronary bypass graft: Secondary | ICD-10-CM

## 2012-09-14 DIAGNOSIS — E039 Hypothyroidism, unspecified: Secondary | ICD-10-CM | POA: Insufficient documentation

## 2012-09-14 DIAGNOSIS — N400 Enlarged prostate without lower urinary tract symptoms: Secondary | ICD-10-CM

## 2012-09-14 NOTE — Progress Notes (Signed)
HPI:  [name] returns today for a scheduled postoperative followup visit. He underwent coronary bypass grafting x5 on April 29. He had to have a urethral stricture dilated and Foley catheter placed by Dr. Berneice Heinrich of urology prior to the procedure. Postoperatively he was a little slow to progress the did not have any major complications. He was treated with Cipro for possible UTI. He also had some antral fibrillation requiring amiodarone. He was discharged to a skilled nursing facility and has been there since discharge.  He says he feels well. He has minimal discomfort and is not taking any pain medication. He's not had any anginal-type pain. He denies any swelling in his legs. He has not yet seen a urologist and still has a catheter in place. He wishes to followup of that issue with Dr. Jerre Simon in [place].  He says his been doing well with therapy and is able to get out of bed to chair on his own.   No current outpatient prescriptions on file.   No current facility-administered medications for this visit.   Past Medical History  Diagnosis Date  . Hypertension   . Anginal pain   . Hyperlipidemia   . Benign prostatic hypertrophy    Prior to Admission medications   Medication Sig Start Date End Date Taking? Authorizing Provider  amiodarone (PACERONE) 400 MG tablet Take 1 tablet (400 mg total) by mouth daily. 08/29/12  Yes Wayne E Gold, PA-C  amLODipine (NORVASC) 10 MG tablet Take 10 mg by mouth daily.   Yes Historical Provider, MD  aspirin EC 325 MG EC tablet Take 1 tablet (325 mg total) by mouth daily. 08/25/12  Yes Wayne E Gold, PA-C  atorvastatin (LIPITOR) 20 MG tablet Take 20 mg by mouth daily.   Yes Historical Provider, MD  Multiple Vitamin (MULTIVITAMIN WITH MINERALS) TABS Take 1 tablet by mouth daily. One a day for men   Yes Historical Provider, MD  nebivolol (BYSTOLIC) 5 MG tablet Take 5 mg by mouth daily.   Yes Historical Provider, MD  omeprazole (PRILOSEC) 20 MG capsule Take  20 mg by mouth daily.   Yes Historical Provider, MD  oxyCODONE (OXY IR/ROXICODONE) 5 MG immediate release tablet Take 1-2 tablets (5-10 mg total) by mouth every 4 (four) hours as needed. 08/26/12  Yes Claudie Revering, NP  tamsulosin (FLOMAX) 0.4 MG CAPS Take 0.4 mg by mouth daily after breakfast.   Yes Historical Provider, MD     Physical Exam BP 130/74  Pulse 71  Resp 16  Ht 5\' 11"  (1.803 m)  Wt 173 lb (78.472 kg)  BMI 24.14 kg/m2  SpO71 29% 72 year old male in no acute distress Neurologic alert and oriented x3 with no focal deficits Lungs slightly diminished at left base Cardiac regular rate and rhythm normal S1 and S2, no rubs Sternum stable, incision healing well Leg incisions healing well no peripheral edema  Diagnostic Tests: none  Impression: 72 year old gentleman status post coronary bypass grafting x5 on April 29. He is now about a month out from surgery. Overall he is doing well.   He does still have the Foley catheter in place and is to followup with a urologist. He prefers to see his urologist in [place] and says he will call and make an appointment.  He should not drive a car for another 2 weeks and should not lift anything over 10 pounds for another 2 weeks. Other than that his activities are unrestricted. Does appear that he is probably ready to go  home from the skilled nursing facility at this point.  His preoperative chest x-ray showed a questionable left upper lobe lung nodule. I am going to order a CT of the chest for next week and have him come back next week to review the results of that.   Plan: Return in one week with CT of chest.  Followup with a urologist.  He has an appointment with Dr. Excell Seltzer next week.

## 2012-09-14 NOTE — Progress Notes (Deleted)
  HPI:  [name] is a 72 year old gentleman   No current outpatient prescriptions on file.   No current facility-administered medications for this visit.    Physical Exam  Diagnostic Tests:   Impression:  Plan:

## 2012-09-14 NOTE — Patient Instructions (Signed)
Do not drive for another 2 weeks  Do not lift over 10 pounds for another 2 weeks  Call for follow up with Urologist ASAP

## 2012-09-14 NOTE — Progress Notes (Signed)
Patient ID: Clayton Austin, male   DOB: [birth year], 72 y.o.   MRN: [id number] Chief complaint; discharge note.   History Clayton Austin is a gentleman who came to [place] from [place] 2 weeks ago after undergoing a CABG x5 [Dr. Hendrickson,]. His postoperative course was complicated by CHF, elevated white count of uncertain etiology. He still has a Foley catheter in however, this is being followed by urology, and he has a urology appointment for tomorrow I suspect they will remove the Foley. Recent lab work shows his white count is down to 9.5, with normal differential. Creatinine on May 19 was 1.68 on the 21st this had come down to 1.47  recent  BNP at 622.8  History here as been fairly unremarkable he did have some desaturation during therapy but clinically has been stable with this-he has been seen by cardiology today and they have cleared him for discharge from their viewpoint.  He is on low dose Lasix.  He does have a history of atrial fibrillation this has been rate controlled he is on amiodarone as well as aspirin for anticoagulation.  For history of anemia he continues on iron and this has been relatively stable as well.  For hypertension he does continue on Norvascs blood pressures appear stable as well.   . Family medical social history as been reviewed.  Medications have been reviewed per MAR.           Review of systems ; Gen.-denies any fever chills looking forward to going home Respiratory no shortness of breath. Cardiac no exertional chest pain or palpitations. GI no exertional nausea, vomiting GU as stated above does have indwelling Foley catheter this is followed by urology. Musculoskeletal.--Does not complaining of joint pain today.  Neurologic-no complaints of headache or dizziness.    Physical exam.  Temperature 97.0 pulse 73 respirations 20 blood pressure 124/67 General this is a pleasant elderly male in no distress sitting comfortably in his wheelchair Resp  clear entry bilaterally, no wheezing. No crackles. Cardiac heart sounds are normal. No murmurs. Regular rate and rhythm without murmur gallop or rub Abdomen no tenderness. No liver no spleen. Musculoskeletal; moves all extremities at baseline-appears to have gained strength since his stay here Neurologic grossly intact no lateralizing findings cranial nerves intact speech is clear.  Psych he is alert and oriented x3.  Labs.  09/08/2012.  Sodium 136 potassium 4.2 BUN 18 creatinine 1.47.  09/06/2012.  Occult blood negative.  09/06/2012.  WBC 9.5 hemoglobin 9.3 platelets 565.  08/27/2012.  TSH-21.109 .   Assessment and plan Coronary artery disease; this is stable. I see no signs of CHF. He has been cleared for discharge by cardiolog-she does continue on aspirin and low-dose Lasix  t CHF. No evidence of this--on low-dose lasix  Leukocytosis. This has resolved . Foley catheter followed by urology.  . Hypothyroidism-he has been started on Synthroid update TSH is pending this will need followup by primary care provider.  Hypertension-this appears stable on Norvasc and Bystolic  Atrial fibrillation-this is rate controlled he is on amiodarone and Bystolic.  Anemia-this has gradually improved he is on iron.  Pain management-is taking OxyIR occasionally with relief   Patient will be going home with his wife continue PT OT as well as followup by cardiology and urology and his primary care provider.   [id number] note greater than 30 minutes spent assessing patient and formulating a plan of care for numerous diagnoses

## 2012-09-15 ENCOUNTER — Other Ambulatory Visit: Payer: Self-pay | Admitting: *Deleted

## 2012-09-15 ENCOUNTER — Non-Acute Institutional Stay (SKILLED_NURSING_FACILITY): Payer: Medicare PPO | Admitting: Internal Medicine

## 2012-09-15 DIAGNOSIS — D72829 Elevated white blood cell count, unspecified: Secondary | ICD-10-CM

## 2012-09-15 DIAGNOSIS — I25810 Atherosclerosis of coronary artery bypass graft(s) without angina pectoris: Secondary | ICD-10-CM

## 2012-09-17 DIAGNOSIS — D649 Anemia, unspecified: Secondary | ICD-10-CM | POA: Insufficient documentation

## 2012-09-17 DIAGNOSIS — N289 Disorder of kidney and ureter, unspecified: Secondary | ICD-10-CM | POA: Insufficient documentation

## 2012-09-17 NOTE — Progress Notes (Signed)
Patient ID: Clayton Austin, male   DOB: 05-23-1940, 72 y.o.   MRN: [id number] Chief complaint-acute visit secondary to multiple issues including leukocytosis anemia renal insufficiency elevated TSH and hiccups as well as vomiting   History of present illness.  Patient is a pleasant 72 year old male with a history of hypertension hyperlipidemia. -- no prior cardiac history.  Apparently he started experiencing exertional chest pain about 4 weeks ago.  He went to the ED after an episode that was more severe and was admitted for an mildly positive troponin.  He was transferred to [facility] he had a cardiac catheterization--it revealed severe left main and three-vessel coronary artery disease and a CABG was recommended.  He apparently tolerated the procedure well.  Appears he may have developed some atrial fibrillation he is on amiodarone which is being titrated down also on a beta blocker  \He is also on a short 5 day course of Lasix I suspect this is for edema question CHF concerns  apparently he did have some urinary retention and there was considerable difficulty placing a Foley catheter-he was found to have a urethral stricture which required an emergent cystoscopy with urethral dilation and placement of the catheter  He did have a urinalysis done that showed bacteremia-and was started on Cipro however the culture did not grow out anything.  Nonetheless he was kept on antibiotic secondary to catheter placement and concerns about leukocytosis which he was experiencing.  I did see him yesterday his main complaint actually with hiccups prescribed Thorazine however apparently there is an interaction with amiodarone so we will have to discontinue this. hisrlabs are concerning for a number of issues.  His white count is elevated at 19.3 which is actually up from his hospital value of 16 on discharge.  He is not complaining of any fever or chills he does have urinary issues as stated above.  His  hemoglobin also was down to 8.7 it was 9.4 in the hospital.  His creatinine also has gone up to 1.57 it was 1.39 in the hospital.  Again he is on a short course of Lasix.  Also his TSH is elevated at 21.1-he does not have a history of hypothyroidism however I note he is on the amiodarone.   he continues to complain of hiccups and this continues again to be is his main complaint--he also has had occasional vomiting episodes and has been on a clear liquid diet the past day--is not complaining of any nausea or specific abdominal pain  Did not really complain of any increased shortness of breath or chest pain or acute dysuria although he has significant urinary issues and continues with an indwelling Foley catheter     Previous medical history.  Coronary artery disease status post CABG as noted above.  Hypertension.  A. fib  Hyperlipidemia.  BPH.---  History of urethral stricture  Leukocytosis   Family medical social history.  Father with a history of coronary artery disease with an MI at age 62 .  Social history.  No longer smokes he has a 5-pack-year smoking history previously.  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   Medications.  Amiodarone 400 mg twice a day until May 11 when it will be reduced to 400 mg a day.  Norvasc 10 mg daily.  Aspirin 325 mg daily.  Lipitor 20 mg daily.  Lasix 40 mg daily for 5 additional days.  Multivitamin daily.  Bystolic 5  mg daily  OxyIR 5 mg every 4 hours as needed.  Potassium 20 mEq daily for 5 days.  Flomax 0.5 mg every morning.  .  Review of systems.  In general denies any fever or chills.  Skin-denies issues--surgical wound in the chest apparently is stable.  Head ears eyes nose mouth and throat-denies any visual changes or sore throat.  He does complain of hiccups  Respiratory-does not complain of increased shortness of breath--has an occasional cough.  Cardiac-denies any  chest pain except he says occasionally when he coughs he says he will feel a little discomfort because of the coughing.  GI-is not really complaining of any abdominal pain nausea --has had some vomiting episodes.  GU-as stated history of present illness does have a an indwelling Foley catheter denies any burning with urination however.  Muscle skeletal-does have some weakness status post surgery but denies any joint pain.  Neurologic-denies any headache or dizziness.  Psych does not appear to have a significant history of depression or anxiety .  Physical exam.  Temperature 98.1 pulse 77 respirations 22 blood pressure 127/71.  In general this is a somewhat frail elderly male in no distress sitting comfortably in his wheelchair.  His skin is warm and dry surgical site midchest -- crusting with no drainage or surrounding erythema slight tenderness to palpation of the area this looks rather benign  Eyes pupils appear equal round reactive to light sclerae are clear.  Oropharynx mucous membranes moist--slight white film on tongue.  chest is clear to auscultation--with somewhat reduced breath sounds-no labored breathing.  Marland Kitchen Heart-is regular rate and rhythm without murmur gallop or rub.  Abdomen is obese soft nontender with positive bowel sounds Rectal-I did attempt to guaiac stools-finding was  equivocal -- not much stool to sample  GU-does have a Foley catheter draining somewhat dark amber colored urine.  Muscle skeletal does move all extremities x4 I do not note any deformities has some lower extremity weakness- debilitation-he has trace lower extremity edema bilaterally--- he is able to stand and support himself.  Neurologic-t normal lateralizing findings-cranial nerves appear grossly intact speech is clear.  Psych he is alert and oriented x3 pleasant and appropriate.   Labs 08/27/2012.  WBC 19.3 hemoglobin 8.7 platelets 499.  Sodium 136 potassium 4.4 BUN 29 creatinine 1.57.  TSH-21.1  .   08/23/2012.  Sodium 136 potassium 4.3 BUN 27 creatinine 1.39.  08/22/2012.  WBC 16.0 hemoglobin 9.4 platelets 234.  Magnesium 2.1.  08/21/2012.  Liver function tests within normal limits except albumin of 3.1.  WUJ-81.191 .  Assessment and plan. #1-leukocytosis unknown etiology-white count is trending up again-Will obtain a chest x-ray---urine sampl an updated CBC with differential on Monday, May 12-also monitor with vital signs pulse ox every shift for any changes  #2-anemia-hemoglobin has trended down a-- will update CBC as stated above-also will order to guaiac stools x3.  #3 renal insufficiency-creatinine is trending up he is on a short course of Lasix Will discontinue the Lasix and potassium after tomorrow's dose-his edema will have to be monitored carefully we'll order weights daily notify provider gain greater than 3 pounds.   #4-coronary artery disease-status post CABG-at this point appears to be stable he does not complain of chest pain or increased shortness of breath he continues on aspirin as well as a statin and a beta blocker.  #5--edema?--Please see #3-again will cut Lasix dose a bit short secondary to renal insufficiency   #6-atrial fibrillation-he is on amiodarone as well as aspirin  and beta blocker-this appears to be rate controlled-continue to monitor.  #7-hypothyroidism-TSH elevated in hospital and this continues to trend up-suspect this may be due to amiodarone-Will start low dose Synthroid 25 mcg a day and recheck a TSH in 4 weeks  #8-hiccups-- situation challenging-- unfortunately the Thorazine interacts with the amiodarone-will try comfort measures again a very challenging situation-  . #9-hypertension-this point appears to be stable on Norvasc as well as beta blocker.  #10-urinary issue--BPH s-continues with Foley catheter for now he is status post cystoscopy for catheter placement with urethral stricture-. White count is going up-a urinalysis and culture has been  ordered #11-vomiting-patient continues to have occasional vomiting episodes-we'll order an abdominal x-ray--has been on a clear liquid diet      .  [id number] -of note greater than 45 minutes spent assessing patient-and formulating a plan of care for numerous diagnoses

## 2012-09-21 ENCOUNTER — Ambulatory Visit
Admission: RE | Admit: 2012-09-21 | Discharge: 2012-09-21 | Disposition: A | Discharge status: Home / Self Care | Payer: Medicare PPO | Source: Ambulatory Visit | Attending: Thoracic Surgery (Cardiothoracic Vascular Surgery) | Admitting: Thoracic Surgery (Cardiothoracic Vascular Surgery)

## 2012-09-21 ENCOUNTER — Other Ambulatory Visit: Payer: Medicare PPO

## 2012-09-21 ENCOUNTER — Ambulatory Visit (INDEPENDENT_AMBULATORY_CARE_PROVIDER_SITE_OTHER): Payer: Medicare PPO | Admitting: Thoracic Surgery (Cardiothoracic Vascular Surgery)

## 2012-09-21 ENCOUNTER — Encounter: Payer: Self-pay | Admitting: Thoracic Surgery (Cardiothoracic Vascular Surgery)

## 2012-09-21 VITALS — BP 114/69 | HR 63 | Resp 20 | Ht 71.0 in | Wt 173.0 lb

## 2012-09-21 DIAGNOSIS — R911 Solitary pulmonary nodule: Secondary | ICD-10-CM

## 2012-09-21 DIAGNOSIS — I2510 Atherosclerotic heart disease of native coronary artery without angina pectoris: Secondary | ICD-10-CM

## 2012-09-21 DIAGNOSIS — Z951 Presence of aortocoronary bypass graft: Secondary | ICD-10-CM

## 2012-09-21 MED ORDER — PREDNISONE 5 MG PO TABS: ORAL_TABLET | ORAL | Status: DC

## 2012-09-21 NOTE — Progress Notes (Signed)
HPI: [name] returns for a second followup visit. He had coronary bypass grafting back in April. On his preoperative chest x-ray there was a question of a lung nodule. He required cystoscopy to place a Foley at the time of surgery by Dr. Berneice Austin. Postoperatively he had some atrial fibrillation.  He was seen in the office last week at which time he was doing well. I did recommend that we get a CT scan to rule out a lung nodule. He has now had that scan and returns to discuss the results.  He says he's been feeling well. He denies any chest pain or shortness of breath. He did not have any complaints today.  Past Medical History  Diagnosis Date  . Hypertension   . Anginal pain   . Hyperlipidemia   . Benign prostatic hypertrophy       Current Outpatient Prescriptions  Medication Sig Dispense Refill  . amiodarone (PACERONE) 400 MG tablet Take 1 tablet (400 mg total) by mouth daily.      Marland Kitchen amLODipine (NORVASC) 10 MG tablet Take 10 mg by mouth daily.      Marland Kitchen aspirin EC 325 MG EC tablet Take 1 tablet (325 mg total) by mouth daily.  30 tablet    . atorvastatin (LIPITOR) 20 MG tablet Take 20 mg by mouth daily.      . furosemide (LASIX) 20 MG tablet Take 20 mg by mouth daily.      . Multiple Vitamin (MULTIVITAMIN WITH MINERALS) TABS Take 1 tablet by mouth daily. One a day for men      . nebivolol (BYSTOLIC) 5 MG tablet Take 5 mg by mouth daily.      Marland Kitchen omeprazole (PRILOSEC) 20 MG capsule Take 20 mg by mouth daily.      Marland Kitchen oxyCODONE (OXY IR/ROXICODONE) 5 MG immediate release tablet Take 1-2 tablets (5-10 mg total) by mouth every 4 (four) hours as needed.  50 tablet  0  . tamsulosin (FLOMAX) 0.4 MG CAPS Take 0.4 mg by mouth daily after breakfast.      . predniSONE (DELTASONE) 5 MG tablet Take 5 tablets by mouth day 1, 4 tablets on day 2, 3 tablets on day 3, 2 tablets on day 4, and 1 tablet on day 5  15 tablet  0   No current facility-administered medications for this visit.    Physical  Exam: 72 year old male in no acute distress BP 114/69  Pulse 63  Resp 20  Ht 5\' 11"  (1.803 m)  Wt 173 lb (78.472 kg)  BMI 24.14 kg/m2  SpO2 97% Cardiac regular rate and rhythm normal S1 and S2 Lungs diminished breath sounds left base Incisions clean dry and intact No peripheral edema   Diagnostic Tests: CT of chest CT CHEST WITHOUT CONTRAST  Technique: Multidetector CT imaging of the chest was performed  following the standard protocol without IV contrast.  Comparison: Chest radiographs dated 09/14/2012  Findings: Moderate left pleural effusion. Associated mild  compressive atelectasis in the left lower lobe. Trace right  pleural effusion. Lungs are otherwise clear. No pneumothorax.  Visualized thyroid is unremarkable.  The heart is normal in size. Trace pericardial fluid. Mildly  complex fluid/stranding anterior to the heart (series 3/image 26),  likely reflecting postsurgical changes. Coronary atherosclerosis.  Additional postsurgical changes related to prior CABG.  Atherosclerotic calcifications of the aortic arch.  Small mediastinal lymph nodes which do not meet pathologic CT size  criteria. No suspicious axillary lymphadenopathy.  Visualized upper abdomen is unremarkable.  Visualized osseous structures are within normal limits.  IMPRESSION:  Moderate left and trace right pleural effusions.  Associated mild left lower lobe atelectasis.  Postsurgical changes related to prior CABG, as described above.  Original Report Authenticated By: Clayton Austin, M.D.  Impression: 72 year old gentleman now about 6 weeks out from coronary bypass grafting. He is doing well at this time. There was no evidence of a lung nodule. I suspect the finding on chest x-ray was a shadow from a large mole on his anterior chest wall. It corresponds to the finding noted on the x-ray.  He does have a moderate left pleural effusion. He is on low dose Lasix, 20 mg a day. His effusion is not large  enough tablet do suspect he might benefit from the steroid taper to help that to resolve. I put a prescription for prednisone 25 mg on day one tapering to 5 mg on day 5.  Plan: Prednisone taper  Followup with Dr. Diona Austin  I will be happy to see him back any time if I can be of any further assistance with his care.

## 2012-09-22 ENCOUNTER — Ambulatory Visit: Payer: Medicare PPO | Admitting: Cardiovascular Disease

## 2012-09-22 NOTE — Progress Notes (Signed)
Patient ID: Clayton Austin, male   DOB: 1940-06-04, 72 y.o.   MRN: [id number]           PROGRESS NOTE  DATE:  09/15/2012    FACILITY: [facility]   LEVEL OF CARE:   SNF   Acute Visit/Discharge Visit   CHIEF COMPLAINT:  Pre-discharge review.    HISTORY OF PRESENT ILLNESS:  Clayton Austin is a gentleman who came to [place] from [facility] after undergoing a CABG x5 (Dr. Dorris Austin).  His postop course was complicated by CHF, leukocytosis of uncertain etiology, and a Foley catheter.     The Foley catheter has been removed by Urology and he is voiding well.    His lab work has come down to a white count which is normal.    I started him on Lasix 20 mg due to exertional shortness of breath and desaturation.  This appears to have resolved that problem.    REVIEW OF SYSTEMS:   CHEST/RESPIRATORY:  No shortness of breath.  CARDIAC:   No exertional chest pain.  No edema.   GI:  No nausea or vomiting.    PHYSICAL EXAMINATION:   GENERAL APPEARANCE:  The patient is not in any distress.   CHEST/RESPIRATORY:  Clear air entry bilaterally.   CARDIOVASCULAR:  CARDIAC:   Heart sounds are normal.  No S4.  JVP is not elevated.   EDEMA/VARICOSITIES:  No edema.    ASSESSMENT/PLAN:  Coronary artery disease.  This is stable.  I see no current signs of CHF.  Last lab work showed a creatinine of 1.61.  He has already followed with Cardiology.   I think this gentleman is set for discharge.  We will have him followed by skilled nursing, PT and OT.  No DME.    CPT CODE: 78295

## 2013-02-01 ENCOUNTER — Other Ambulatory Visit: Payer: Self-pay | Admitting: *Deleted

## 2013-02-01 ENCOUNTER — Encounter: Payer: Self-pay | Admitting: Physician Assistant

## 2013-02-01 ENCOUNTER — Ambulatory Visit (INDEPENDENT_AMBULATORY_CARE_PROVIDER_SITE_OTHER): Payer: Medicare PPO | Admitting: Physician Assistant

## 2013-02-01 VITALS — BP 160/90 | HR 70 | Ht 71.0 in | Wt 179.0 lb

## 2013-02-01 DIAGNOSIS — E039 Hypothyroidism, unspecified: Secondary | ICD-10-CM

## 2013-02-01 DIAGNOSIS — I2510 Atherosclerotic heart disease of native coronary artery without angina pectoris: Secondary | ICD-10-CM

## 2013-02-01 DIAGNOSIS — I4891 Unspecified atrial fibrillation: Secondary | ICD-10-CM

## 2013-02-01 DIAGNOSIS — I10 Essential (primary) hypertension: Secondary | ICD-10-CM

## 2013-02-01 DIAGNOSIS — E785 Hyperlipidemia, unspecified: Secondary | ICD-10-CM

## 2013-02-01 LAB — BASIC METABOLIC PANEL
BUN: 14 mg/dL (ref 6–23)
CO2: 26 mEq/L (ref 19–32)
Chloride: 102 mEq/L (ref 96–112)
Creatinine, Ser: 1.6 mg/dL — ABNORMAL HIGH (ref 0.4–1.5)

## 2013-02-01 LAB — HEPATIC FUNCTION PANEL
ALT: 76 U/L — ABNORMAL HIGH (ref 0–53)
AST: 62 U/L — ABNORMAL HIGH (ref 0–37)
Albumin: 3.9 g/dL (ref 3.5–5.2)

## 2013-02-01 NOTE — Telephone Encounter (Signed)
pt states he was driving in his truck right now and asked for me tcb tomorrow. I said that was fine and we will go over results then

## 2013-02-01 NOTE — Progress Notes (Signed)
[address], [address] [place], [place]  [address] Phone: [phone] Fax:  [phone]  Date:  02/01/2013   ID:  Clayton Austin, Clayton Austin 1940-06-05, MRN [id number]  PCP:  Avon Gully, MD  Cardiologist:  Dr. Nona Dell     History of Present Illness: Clayton Austin is a 72 y.o. male who returns for follow up.  He has a hx of HTN, HL, tobacco abuse. He was transferred from [facility] in 07/2012 with a non-STEMI. LHC demonstrated critical distal left main disease at 95% with extension to the ostial circumflex and ostial LAD and moderate RCA disease. EF was normal at 55-60%. He underwent CABG with Dr. Dorris Fetch (LIMA-D1 and LAD, SVG-OM2, SVG-PDA and PL). Postoperative course was notable for atrial fibrillation. He converted to NSR with amiodarone. He did have difficulty with Foley placement and was seen by urology. Postoperatively, he was followed up by Dr. Dorris Fetch. There was a question of a lung nodule. Chest CT in 09/2012 demonstrated moderate left effusion, trace right effusion and no nodule. Patient was asked to follow up with Dr. Diona Browner in [place].  This is his first followup with cardiology since his bypass surgery.  It appears that he had an elevated TSH prior to discharge from the hospital. He tells me that he is on Synthroid. He does not know the dosage.  He remains on amiodarone. He overall feels well. He denies chest discomfort, dyspnea, syncope, orthopnea, PND or edema. He brings in a paper from his employer. 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  Labs (5/14):  K 4.3, Cr 1.39, ALT 18, Hgb 9.4, TSH 16.266  Wt Readings from Last 3 Encounters:  09/21/12 173 lb (78.472 kg)  09/14/12 173 lb (78.472 kg)  08/25/12 173 lb 6.4 oz (78.654 kg)     Past Medical History  Diagnosis Date  . Hypertension   . Hyperlipidemia   . Benign prostatic hypertrophy   . Coronary artery disease     a. NSTEMI 07/2012 => critical LM disease on LHC =>  s/p CABG (L-LAD/D1, S-OM2, S-PDA/PL);  EF 55-60% at [facility] 07/2012  . Hypothyroidism   . Atrial fibrillation     post op => amiodarone Rx    Current Outpatient Prescriptions  Medication Sig Dispense Refill  . amiodarone (PACERONE) 400 MG tablet Take 1 tablet (400 mg total) by mouth daily.      Marland Kitchen amLODipine (NORVASC) 10 MG tablet Take 10 mg by mouth daily.      Marland Kitchen aspirin EC 325 MG EC tablet Take 1 tablet (325 mg total) by mouth daily.  30 tablet    . atorvastatin (LIPITOR) 20 MG tablet Take 20 mg by mouth daily.      . furosemide (LASIX) 20 MG tablet Take 20 mg by mouth daily.      . Multiple Vitamin (MULTIVITAMIN WITH MINERALS) TABS Take 1 tablet by mouth daily. One a day for men      . nebivolol (BYSTOLIC) 5 MG tablet Take 5 mg by mouth daily.      Marland Kitchen omeprazole (PRILOSEC) 20 MG capsule Take 20 mg by mouth daily.      Marland Kitchen oxyCODONE (OXY IR/ROXICODONE) 5 MG immediate release tablet Take 1-2 tablets (5-10 mg total) by mouth every 4 (four) hours as needed.  50 tablet  0  . predniSONE (DELTASONE) 5 MG tablet Take 5 tablets by mouth day 1, 4 tablets on day 2, 3 tablets on day 3,  2 tablets on day 4, and 1 tablet on day 5  15 tablet  0  . tamsulosin (FLOMAX) 0.4 MG CAPS Take 0.4 mg by mouth daily after breakfast.       No current facility-administered medications for this visit.    Allergies:   No Known Allergies  Social History:  The patient  reports that he has quit smoking. His smoking use included Cigarettes. He has a 5 pack-year smoking history. He does not have any smokeless tobacco history on file. He reports that he does not drink alcohol or use illicit drugs.   Family History:  The patient's family history includes Coronary artery disease (age of onset: 60) in his father.   ROS:  Please see the history of present illness.      All other systems reviewed and negative.   PHYSICAL EXAM: VS:  BP 160/90  Pulse 70  Ht 5\' 11"  (1.803 m)  Wt 179 lb (81.194 kg)  BMI 24.98 kg/m2 Well nourished,  well developed, in no acute distress HEENT: normal Neck: no JVD Cardiac:  normal S1, S2; RRR; no murmur Lungs:  clear to auscultation bilaterally, no wheezing, rhonchi or rales Abd: soft, nontender, no hepatomegaly Ext: no edema Skin: warm and dry Neuro:  CNs 2-12 intact, no focal abnormalities noted  EKG:  NSR, HR 70, normal axis, RBBB     ASSESSMENT AND PLAN:  1. CAD: He is doing well after bypass surgery performed in 07/2012. Continue aspirin, statin. 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 ETT will be arranged. He would like to follow up in [place]. We will arrange a stress test at our office there. 2. Atrial Fibrillation:  He had postoperative atrial fibrillation. He remains in normal sinus rhythm. Unfortunately, he has not been seen in followup since this d/c from the hospital.  He remains on amiodarone.  He has hypothyroidism.  D/c Amiodarone.  Check LFTs and TSH today. 3. Hypertension:  He tells me that his BP is usually better controlled.  Consider adding ACEI if BP remains high. 4. Hyperlipidemia:  Continue statin.  Arrange follow up Lipids. 5. Hypothyroidism: Check followup TSH today. 6. Disposition: He prefers to follow up in our [place] office. I will range follow up with Dr. Diona Browner in 3 months.  Signed, Clayton Newcomer, PA-C  02/01/2013 11:48 AM

## 2013-02-01 NOTE — Telephone Encounter (Signed)
Message copied by Tarri Fuller on Tue Feb 01, 2013  5:08 PM ------      Message from: [place], [place] T      Created: Tue Feb 01, 2013  4:48 PM       Creatinine increased      Stop Lasix.  If taking K+, stop K+ too.      Repeat BMET in 1 week.      LFTs mildly elevated.      Amiodarone stopped today.       Repeat LFTs in 1 month.      TSH very high.      He told me he is on Synthroid.       Amiodarone was stopped today.      Fax labs to PCP to further adjust thyroid medication.      Tereso Newcomer, PA-C        02/01/2013 4:48 PM ------

## 2013-02-01 NOTE — Patient Instructions (Signed)
STOP AMIODARONE AS OF TODAY PER [name], PAC  PLEASE SCHEDULE TO HAVE A GXT IN THE [place] OFFICE. ALSO WITH FASTING LIPID PANEL THAT DAY  PLEASE FOLLOW UP WITH DR. MCDOWELL IN [place] IN 3 MONTHS  LABS TODAY; BMET, TSH, LFT  CALL us BACK TO LET us KNOW WHAT THE DOSE IS OF YOUR SYNTHROID

## 2013-02-03 ENCOUNTER — Telehealth: Payer: Self-pay | Admitting: *Deleted

## 2013-02-03 DIAGNOSIS — I10 Essential (primary) hypertension: Secondary | ICD-10-CM

## 2013-02-03 MED ORDER — LEVOTHYROXINE SODIUM 25 MCG PO TABS: 25.0000 ug | ORAL_TABLET | Freq: Every day | ORAL | Status: DC

## 2013-02-03 NOTE — Telephone Encounter (Signed)
s/w pt about lab results and when I asked for him to hold lasix and if he was taking K+ then stop that too. Pt states he does not have lasix, K+. I then asked pt to verify what meds he is taking. After going through his med list he states he is not taking norvasc, lipitor, or lasix ; states he doe snot have these meds. I said I will d/w Bing Neighbors. PA to let him know to see what he may want to change and I w/cb later today, pt said ok to lmom

## 2013-02-03 NOTE — Telephone Encounter (Signed)
He should be on thyroid medication. Fax labs to PCP and have him f/u ASAP so that thyroid medication can be resumed Tereso Newcomer, PA-C   02/03/2013 5:10 PM

## 2013-02-03 NOTE — Telephone Encounter (Signed)
New problem    Patient does not remember which medication he was told to stop taken . Please advise.

## 2013-02-03 NOTE — Telephone Encounter (Signed)
New problem   Pt has questions about medication Clayton Austin talked to him about at his appt

## 2013-02-03 NOTE — Telephone Encounter (Signed)
I spoke with the pt and made him aware of 02/01/13 lab results and PA recommendations.  The pt looked at his medication and states that he is not taking Furosemide.  The pt also said that he is not taking a potassium supplement and he is not on thyroid medication. The pt is aware that Amiodarone should be stopped. I will forward this message to Tereso Newcomer PA-C to determine if any other changes should be made.  I will call the pt back and arrange follow-up labs after Lorin Picket reviews this message.

## 2013-02-03 NOTE — Telephone Encounter (Signed)
lmom per Bing Neighbors. PA re-start norvasc, make sur to take lipitor, f/u w/PCP about thyroid med needs to be adjusted, labs bmet, lft in 2 weeks. Pt somewhat confused about his meds. Lauren B, RN and I both went over meds and both got different answers.

## 2013-02-10 ENCOUNTER — Encounter: Payer: Self-pay | Admitting: *Deleted

## 2013-02-10 ENCOUNTER — Ambulatory Visit (HOSPITAL_COMMUNITY): Admission: RE | Admit: 2013-02-10 | Payer: Medicare PPO | Source: Ambulatory Visit

## 2013-02-21 ENCOUNTER — Encounter (HOSPITAL_COMMUNITY): Payer: Medicare PPO

## 2013-02-23 ENCOUNTER — Encounter (HOSPITAL_COMMUNITY): Payer: Medicare PPO

## 2013-03-07 ENCOUNTER — Encounter (HOSPITAL_COMMUNITY): Payer: Medicare PPO

## 2013-03-14 ENCOUNTER — Ambulatory Visit (HOSPITAL_COMMUNITY)
Admission: RE | Admit: 2013-03-14 | Discharge: 2013-03-14 | Disposition: A | Discharge status: Home / Self Care | Payer: Medicare PPO | Source: Ambulatory Visit | Attending: Physician Assistant | Admitting: Physician Assistant

## 2013-03-14 DIAGNOSIS — I2510 Atherosclerotic heart disease of native coronary artery without angina pectoris: Secondary | ICD-10-CM

## 2013-03-14 DIAGNOSIS — I10 Essential (primary) hypertension: Secondary | ICD-10-CM

## 2013-03-14 NOTE — Progress Notes (Signed)
Stress Lab Nurses Notes - Clayton Austin 03/14/2013 Reason for doing test: CAD Type of test: Regular GTX Nurse performing test: Parke Poisson, RN Nuclear Medicine Tech: Not Applicable Echo Tech: Not Applicable MD performing test: Dr.Koneswaran/ K. Lyman Bishop NP Family MD: Felecia Shelling  Test explained and consent signed: yes IV started: No IV started Symptoms: SOB & Fatigue Treatment/Intervention: None Reason test stopped: reached target HR After recovery IV was: NA Patient to return to Nuc. Med at : NA Patient discharged: Home Patient's Condition upon discharge was: stable Comments: During test peak BP 216/79 & HR 130 . Recovery BP 171/79 & HR 86.  Symptoms resolved in recovery. [name] T

## 2013-03-29 ENCOUNTER — Telehealth: Payer: Self-pay

## 2013-03-29 NOTE — Telephone Encounter (Signed)
Clayton Austin needs reading of GXT done 03/14/13 as pt needs to take to pcp thx cathey

## 2013-03-30 NOTE — Telephone Encounter (Signed)
I s/w Tonya in the [place] office in reference to GXT report. Archie Patten stated she did not mean to call [facility]. office and that they are working on getting the report from [employer].

## 2013-03-30 NOTE — Addendum Note (Signed)
Encounter addended by: Laqueta Linden, MD on: 03/30/2013  1:18 PM<BR>     Documentation filed: Clinical Notes

## 2013-03-30 NOTE — Progress Notes (Signed)
Clayton Austin  03/14/2013  Reason for doing test: CAD  Type of test: Regular GTX  Nurse performing test: Parke Poisson, RN  Nuclear Medicine Tech: Not Applicable  Echo Tech: Not Applicable  MD performing test: Dr.Aveyah Greenwood/ K. Lyman Bishop NP  Family MD: Felecia Shelling  Test explained and consent signed: yes  IV started: No IV started  Symptoms: SOB & Fatigue  Treatment/Intervention: None  Reason test stopped: reached target HR  After recovery IV was: NA  Patient to return to Nuc. Med at : NA  Patient discharged: Home  Patient's Condition upon discharge was: stable  Comments: During test peak BP 216/79 & HR 130 . Recovery BP 171/79 & HR 86. Symptoms resolved in recovery.  Clayton Austin      ATTENDING ADDENDUM: Resting ECG showed NSR with a RBBB. ECG with stress showed no diagnostic ST-Austin abnormalities, nor any arrhythmias. Duke treadmill score is 5.5 (low risk). This represents a normal exercise treadmill stress test.

## 2013-09-01 NOTE — Progress Notes (Signed)
This encounter was created in error - please disregard.

## 2014-03-30 ENCOUNTER — Encounter (HOSPITAL_COMMUNITY): Payer: Self-pay | Admitting: Cardiovascular Disease

## 2014-05-12 DIAGNOSIS — I10 Essential (primary) hypertension: Secondary | ICD-10-CM | POA: Diagnosis not present

## 2014-05-12 DIAGNOSIS — I2510 Atherosclerotic heart disease of native coronary artery without angina pectoris: Secondary | ICD-10-CM | POA: Diagnosis not present

## 2014-05-29 ENCOUNTER — Encounter: Payer: Medicare HMO | Admitting: Physician Assistant

## 2014-05-29 NOTE — Progress Notes (Signed)
This encounter was created in error - please disregard.

## 2014-05-29 NOTE — Progress Notes (Deleted)
Cardiology Office Note   Date:  05/29/2014   ID:  Clayton Austin Jun 26, 1940, MRN [id number]  PCP:  Avon Gully, MD  Cardiologist:  Dr. Nona Dell [facility])   No chief complaint on file.    History of Present Illness: Clayton Austin is a 74 y.o. male with a hx of ***CAD, HTN, HL, tobacco abuse. He was transferred from [facility] in 07/2012 with a non-STEMI. LHC demonstrated critical distal left main disease at 95% with extension to the ostial circumflex and ostial LAD and moderate RCA disease. EF was normal at 55-60%. He underwent CABG with Dr. Dorris Fetch (LIMA-D1 and LAD, SVG-OM2, SVG-PDA and PL). Postoperative course was notable for atrial fibrillation and converted to NSR with amiodarone.   ***  Studies/Reports Reviewed Today:  Cardiac Catheterization 08/16/12 Distal LM 95% stenosis extending into both the LAD and left circumflex. Prox and Mid LAD 70% then 80% Mid RCA 70%; PL Branch 50% EF 55-60%  Exercise Treadmill Test 03/14/13 No diagnostic ST-T changes; Hypertensive BP response.   Past Medical History  Diagnosis Date  . Hypertension   . Hyperlipidemia   . Benign prostatic hypertrophy   . Coronary artery disease     a. NSTEMI 07/2012 => critical LM disease on LHC => s/p CABG (L-LAD/D1, S-OM2, S-PDA/PL);  EF 55-60% at [facility] 07/2012  . Hypothyroidism   . Atrial fibrillation     post op => amiodarone Rx    Past Surgical History  Procedure Laterality Date  . Prostate surgery  06/2012  . Cystoscopy N/A 08/17/2012    Procedure: CYSTOSCOPY FLEXIBLE for difficult  foley catheter insertion;  Surgeon: Sebastian Ache, MD;  Location: [facility] OR;  Service: Urology;  Laterality: N/A;  . Coronary artery bypass graft N/A 08/17/2012    Procedure: CORONARY ARTERY BYPASS GRAFTING (CABG);  Surgeon: Loreli Slot, MD;  Location: [facility] OR;  Service: Open Heart Surgery;  Laterality: N/A;  . Left heart catheterization with coronary angiogram N/A 08/16/2012   Procedure: LEFT HEART CATHETERIZATION WITH CORONARY ANGIOGRAM;  Surgeon: Tonny Bollman, MD;  Location: [facility] CATH LAB;  Service: Cardiovascular;  Laterality: N/A;     Current Outpatient Prescriptions  Medication Sig Dispense Refill  . amLODipine (NORVASC) 10 MG tablet Take 10 mg by mouth daily.    Marland Kitchen aspirin EC 325 MG EC tablet Take 1 tablet (325 mg total) by mouth daily. 30 tablet   . atorvastatin (LIPITOR) 20 MG tablet Take 20 mg by mouth daily.    Marland Kitchen levothyroxine (SYNTHROID, LEVOTHROID) 25 MCG tablet Take 1 tablet (25 mcg total) by mouth daily before breakfast.    . Multiple Vitamin (MULTIVITAMIN WITH MINERALS) TABS Take 1 tablet by mouth daily. One a day for men    . nebivolol (BYSTOLIC) 5 MG tablet Take 5 mg by mouth daily.    Marland Kitchen omeprazole (PRILOSEC) 20 MG capsule Take 20 mg by mouth daily.    Marland Kitchen oxyCODONE (OXY IR/ROXICODONE) 5 MG immediate release tablet Take 1-2 tablets (5-10 mg total) by mouth every 4 (four) hours as needed. 50 tablet 0  . predniSONE (DELTASONE) 5 MG tablet Take 5 tablets by mouth day 1, 4 tablets on day 2, 3 tablets on day 3, 2 tablets on day 4, and 1 tablet on day 5 15 tablet 0  . tamsulosin (FLOMAX) 0.4 MG CAPS Take 0.4 mg by mouth daily after breakfast.     No current facility-administered medications for this visit.    Allergies:   Review of patient's allergies indicates  no known allergies.    Social History:  The patient  reports that he has quit smoking. His smoking use included Cigarettes. He has a 5 pack-year smoking history. He does not have any smokeless tobacco history on file. He reports that he does not drink alcohol or use illicit drugs.   Family History:  The patient's ***family history includes Coronary artery disease (age of onset: 4265) in his father.    ROS:  Please see the history of present illness.   Otherwise, review of systems are positive for {NONE DEFAULTED:18576}.   All other systems are reviewed and negative.    PHYSICAL EXAM: VS:  There  were no vitals taken for this visit.    Wt Readings from Last 3 Encounters:  02/01/13 179 lb (81.194 kg)  09/21/12 173 lb (78.472 kg)  09/14/12 173 lb (78.472 kg)     GEN: Well nourished, well developed, in no acute distress HEENT: normal Neck: *** JVD, ***carotid bruits, no masses Cardiac:  Normal S1/S2, ***RRR; *** murmur ***, *** no rubs or gallops, {NUMBERS; 1+ TO 4+, TRACE/RARE:14493} edema  Respiratory:  ***clear to auscultation bilaterally, no wheezing, rhonchi or rales. GI: ***soft, nontender, nondistended, + BS MS: no deformity or atrophy Skin: warm and dry  Neuro:  CNs II-XII intact, Strength and sensation are intact Psych: Normal affect   EKG:  EKG {ACTION; IS/IS WUJ:[id number]}OT:[id number]} ordered today.  It demonstrates:   ***   Recent Labs: No results found for requested labs within last 365 days.    Lipid Panel No results found for: CHOL, TRIG, HDL, CHOLHDL, VLDL, LDLCALC, LDLDIRECT    ASSESSMENT AND PLAN:  1.  Coronary Artery Disease:  *** 2.  Hypertension:  *** 3.  Hyperlipidemia:  ***   Current medicines are reviewed at length with the patient today.  The patient {ACTIONS; HAS/DOES NOT HAVE:19233} concerns regarding medicines.  The following changes have been made:  {PLAN; NO CHANGE:13088:s}  Labs/ tests ordered today include: *** No orders of the defined types were placed in this encounter.     Disposition:   FU with *** in {gen number 2-95:621308}0-10:310397} {TIME; UNITS DAY/WEEK/MONTH:19136}   Signed, Brynda RimScott Weaver, PA-C, MHS 05/29/2014 8:08 AM    [facility] [address], [place], [place]  [address] Phone: [phone]; Fax: [phone]

## 2014-05-30 ENCOUNTER — Telehealth: Payer: Self-pay | Admitting: Physician Assistant

## 2014-05-30 DIAGNOSIS — E039 Hypothyroidism, unspecified: Secondary | ICD-10-CM | POA: Diagnosis not present

## 2014-05-30 DIAGNOSIS — I10 Essential (primary) hypertension: Secondary | ICD-10-CM | POA: Diagnosis not present

## 2014-05-30 DIAGNOSIS — Z955 Presence of coronary angioplasty implant and graft: Secondary | ICD-10-CM | POA: Diagnosis not present

## 2014-05-30 DIAGNOSIS — Z833 Family history of diabetes mellitus: Secondary | ICD-10-CM | POA: Diagnosis not present

## 2014-05-30 DIAGNOSIS — Z1211 Encounter for screening for malignant neoplasm of colon: Secondary | ICD-10-CM | POA: Diagnosis not present

## 2014-05-30 DIAGNOSIS — Z79899 Other long term (current) drug therapy: Secondary | ICD-10-CM | POA: Diagnosis not present

## 2014-05-30 NOTE — Telephone Encounter (Signed)
New message      Calling to see if the return to work form is ready.  Please fax to number on form.  If there is a problem, please call

## 2014-05-31 ENCOUNTER — Telehealth: Payer: Self-pay | Admitting: *Deleted

## 2014-05-31 NOTE — Telephone Encounter (Signed)
Follow up      Pt want to know if the return to work form has been faxed?

## 2014-05-31 NOTE — Telephone Encounter (Signed)
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 Patient last seen scott weaver PA at [address].

## 2014-05-31 NOTE — Telephone Encounter (Signed)
Clayton ButteNivida L Murphy, RN at 05/31/2014 11:20 AM     Status: Signed       Expand All Collapse All   [place]. PA recommended for pt to have blood work and a GXT [place] in [place]. Pt to have a F/U visit with Dr. Richrd HumblesMacdowel in 3 months with would have been in January 2016. Pt has not made a F/U office visit At this time.pt is aware that he needs to call the [place] office for return to work letter.       Will forward to Dr. Diona BrownerMcDowell

## 2014-05-31 NOTE — Telephone Encounter (Signed)
[place]. PA recommended for pt to have blood work and a GXT done in [place]. Pt to have a F/U  visit with Dr. Richrd HumblesMacdowel in 3 months with would have been in January 2016. Pt has not made a  F/U office visit  At this time.pt is aware that he needs to call the [place] office for return to work letter.

## 2014-06-01 NOTE — Telephone Encounter (Signed)
I reviewed the records, and have never seen this patient. Not entirely clear why I was listed as as his primary cardiologist. He had a GXT read by Dr. Purvis SheffieldKoneswaran in December, and it looks like he has a visit to see him next Tuesday in [place]. Will forward.

## 2014-06-06 ENCOUNTER — Ambulatory Visit (INDEPENDENT_AMBULATORY_CARE_PROVIDER_SITE_OTHER): Payer: Medicare HMO | Admitting: Cardiovascular Disease

## 2014-06-06 ENCOUNTER — Encounter: Payer: Self-pay | Admitting: Cardiovascular Disease

## 2014-06-06 VITALS — BP 133/81 | HR 87 | Ht 71.0 in | Wt 193.0 lb

## 2014-06-06 DIAGNOSIS — E785 Hyperlipidemia, unspecified: Secondary | ICD-10-CM

## 2014-06-06 DIAGNOSIS — I252 Old myocardial infarction: Secondary | ICD-10-CM | POA: Diagnosis not present

## 2014-06-06 DIAGNOSIS — I10 Essential (primary) hypertension: Secondary | ICD-10-CM

## 2014-06-06 DIAGNOSIS — I25812 Atherosclerosis of bypass graft of coronary artery of transplanted heart without angina pectoris: Secondary | ICD-10-CM

## 2014-06-06 MED ORDER — ASPIRIN EC 81 MG PO TBEC: 81.0000 mg | DELAYED_RELEASE_TABLET | Freq: Every day | ORAL | Status: DC

## 2014-06-06 NOTE — Patient Instructions (Signed)
   Decrease Aspirin to 81mg daily  Continue all other medications.   Your physician wants you to follow up in:  1 year.  You will receive a reminder letter in the mail one-two months in advance.  If you don't receive a letter, please call our office to schedule the follow up appointment   

## 2014-06-06 NOTE — Progress Notes (Addendum)
Patient ID: Clayton Austin, male   DOB: 02-Feb-1941, 74 y.o.   MRN: [id number]      SUBJECTIVE: The patient is a 74 year old male who I am evaluating for the first time. He was most recently seen by Tereso NewcomerScott Weaver PA-C in October 2014. He went underwent a normal exercise treadmill stress test which was deemed low risk in December 2014 with a Duke treadmill score of 5.5. He has a history of coronary artery disease and CABG in 2014 after sustaining a non-STEMI. He also has a history of hypertension and hyperlipidemia. He has normal left ventricular systolic function. He is a Naval architecttruck driver. He presently has no complaints whatsoever, specifically denying chest pain, palpitations, shortness of breath, lightheadedness, leg swelling, dizziness, orthopnea, and paroxysmal nocturnal dyspnea. When he is on the road driving a truck he does a fair amount of walking and does some walking around his house with activities such as yard work with no limitations. ECG performed in the office today demonstrates sinus rhythm with an underlying right bundle branch block, unchanged from ECGs in 2014. He has been off of work for the past 2-3 weeks and would like to resume truck driving.  Review of Systems: As per "subjective", otherwise negative.  No Known Allergies  Current Outpatient Prescriptions  Medication Sig Dispense Refill  . amLODipine (NORVASC) 10 MG tablet Take 10 mg by mouth daily.    Marland Kitchen. aspirin EC 325 MG EC tablet Take 1 tablet (325 mg total) by mouth daily. 30 tablet   . atorvastatin (LIPITOR) 20 MG tablet Take 20 mg by mouth daily.    Marland Kitchen. levothyroxine (SYNTHROID, LEVOTHROID) 25 MCG tablet Take 1 tablet (25 mcg total) by mouth daily before breakfast.    . metoprolol tartrate (LOPRESSOR) 25 MG tablet Take 25 mg by mouth 2 (two) times daily.  3  . omeprazole (PRILOSEC) 20 MG capsule Take 20 mg by mouth daily.    . tamsulosin (FLOMAX) 0.4 MG CAPS Take 0.4 mg by mouth daily after breakfast.     No current  facility-administered medications for this visit.    Past Medical History  Diagnosis Date  . Hypertension   . Hyperlipidemia   . Benign prostatic hypertrophy   . Coronary artery disease     a. NSTEMI 07/2012 => critical LM disease on LHC => s/p CABG (L-LAD/D1, S-OM2, S-PDA/PL);  EF 55-60% at [facility] 07/2012  . Hypothyroidism   . Atrial fibrillation     post op => amiodarone Rx    Past Surgical History  Procedure Laterality Date  . Prostate surgery  06/2012  . Cystoscopy N/A 08/17/2012    Procedure: CYSTOSCOPY FLEXIBLE for difficult  foley catheter insertion;  Surgeon: Sebastian Acheheodore Manny, MD;  [facility];  Service: Urology;  Laterality: N/A;  . Coronary artery bypass graft N/A 08/17/2012    Procedure: CORONARY ARTERY BYPASS GRAFTING (CABG);  Surgeon: Loreli SlotSteven C Hendrickson, MD;  [facility];  Service: Open Heart Surgery;  Laterality: N/A;  . Left heart catheterization with coronary angiogram N/A 08/16/2012    Procedure: LEFT HEART CATHETERIZATION WITH CORONARY ANGIOGRAM;  Surgeon: Tonny BollmanMichael Cooper, MD;  [facility];  Service: Cardiovascular;  Laterality: N/A;    History   Social History  . Marital Status: Married    Spouse Name: N/A  . Number of Children: N/A  . Years of Education: N/A   Occupational History  . Not on file.   Social History Main Topics  . Smoking status: Former Smoker -- 0.50  packs/day for 10 years    Types: Cigarettes    Start date: 08/05/1960    Quit date: 08/06/1970  . Smokeless tobacco: Never Used  . Alcohol Use: No  . Drug Use: No  . Sexual Activity: Not on file   Other Topics Concern  . Not on file   Social History Narrative     Filed Vitals:   06/06/14 1125  BP: 133/81  Pulse: 87  Height:  (1.803 m)  Weight: 193 lb (87.544 kg)    PHYSICAL EXAM General: NAD HEENT: Normal. Neck: No JVD, no thyromegaly. Lungs: Clear to auscultation bilaterally with normal respiratory effort. CV: Nondisplaced PMI.  Regular rate and rhythm,  normal S1/S2, no S3/S4, no murmur. No pretibial or periankle edema.  No carotid bruit.  Normal pedal pulses.  Abdomen: Soft, nontender, no hepatosplenomegaly, no distention.  Neurologic: Alert and oriented x 3.  Psych: Normal affect. Skin: Normal. Musculoskeletal: Normal range of motion, no gross deformities. Extremities: No clubbing or cyanosis.   ECG: Most recent ECG reviewed.      ASSESSMENT AND PLAN: 1. CAD with history of CABG and NSTEMI: Stable ischemic heart disease. Continue metoprolol and Lipitor at present doses. Will reduce ASA to 81 mg daily. 2. Hyperlipidemia: Will obtain copy of lipids from PCP's office. Continue Lipitor 20 mg daily. 3. Essential hypertension: Well controlled on present therapy. No changes.  Dispo: f/u 1 year. Able to resume working from my standpoint.  Prentice Docker, M.D., F.A.C.C.

## 2014-06-07 ENCOUNTER — Encounter: Payer: Self-pay | Admitting: *Deleted

## 2014-06-12 DIAGNOSIS — I2510 Atherosclerotic heart disease of native coronary artery without angina pectoris: Secondary | ICD-10-CM | POA: Diagnosis not present

## 2014-06-12 DIAGNOSIS — I10 Essential (primary) hypertension: Secondary | ICD-10-CM | POA: Diagnosis not present

## 2014-07-13 DIAGNOSIS — I10 Essential (primary) hypertension: Secondary | ICD-10-CM | POA: Diagnosis not present

## 2014-07-13 DIAGNOSIS — I2510 Atherosclerotic heart disease of native coronary artery without angina pectoris: Secondary | ICD-10-CM | POA: Diagnosis not present

## 2014-08-03 ENCOUNTER — Other Ambulatory Visit: Payer: Self-pay | Admitting: *Deleted

## 2014-08-03 NOTE — Patient Outreach (Signed)
[facility]N) Care Management  08/03/2014  Clayton QuillJames W Hoak 01/02/1941 [id number]  [employer] Tier 4 referral: Telephone call to patient; left voice message requesting call back.  Plan: will return call.  Colleen CanLinda Jaxxon Naeem, RN BSN CCM Care Management Coordinator [facility] Care Management  [phone]  .

## 2014-08-10 ENCOUNTER — Encounter: Payer: Self-pay | Admitting: *Deleted

## 2014-08-10 ENCOUNTER — Other Ambulatory Visit: Payer: Self-pay | Admitting: *Deleted

## 2014-08-10 NOTE — Patient Outreach (Signed)
[facility]N) Care Management  08/10/2014  Loann QuillJames W Voigt 1941-02-17 [id number]   [employer] Referral:  Telephone call to patient; left message requesting return call.  Plan: will send outreach letter.  Colleen CanLinda Rosela Supak, RN BSN CCM Care Management Coordinator [facility] Care Management  [phone]

## 2014-08-10 NOTE — Patient Outreach (Signed)
[facility]) Care Management  08/10/2014  Loann QuillJames W Pola [birth year] [id number]   Communication sent to patient.  Colleen CanLinda Ayleen Mckinstry, RN BSN CCM Care Management Coordinator [facility] Care Management  [phone]

## 2014-08-19 DIAGNOSIS — I10 Essential (primary) hypertension: Secondary | ICD-10-CM | POA: Diagnosis not present

## 2014-08-19 DIAGNOSIS — I2510 Atherosclerotic heart disease of native coronary artery without angina pectoris: Secondary | ICD-10-CM | POA: Diagnosis not present

## 2014-08-24 ENCOUNTER — Encounter: Payer: Self-pay | Admitting: *Deleted

## 2014-09-11 ENCOUNTER — Other Ambulatory Visit: Payer: Self-pay | Admitting: *Deleted

## 2014-09-11 DIAGNOSIS — I10 Essential (primary) hypertension: Secondary | ICD-10-CM

## 2014-09-11 DIAGNOSIS — I25810 Atherosclerosis of coronary artery bypass graft(s) without angina pectoris: Secondary | ICD-10-CM

## 2014-09-11 NOTE — Patient Outreach (Signed)
[facility]) Care Management  09/11/2014  Clayton Austin 1940/12/03 [id number]  [facility]  Recently patient called [facility] patient with complaints of bilateral ankle swelling last week. Patient states no swelling currently. States she has appointment with primary care doctor in June.   Patient states has had history of heart bypass surgery and stent 2014 as well as prostate surgery. States currently not seeing specialists.   Using mail order pharmacy for 90 day supply of medications and local pharmacy for short term medications. Patient states taking medications as prescribed but does not have knowledge of why he is taking some of his medications.  Patient does have knowledge deficit regarding his diagnoses of  hypertension and CAD.  Patient drives and has no problems getting to MD appointments. Patient lives with wife and has family support.    Patient would benefit from [facility] community care coordinator referral. Patient advised of [facility] services and agrees to services for health assessments, education and medication management.   Will route referral to [facility] community [facility].  Colleen CanLinda Kandon Hosking, RN BSN CCM Care Management Coordinator [facility] Care Management  [phone]

## 2014-09-11 NOTE — Patient Outreach (Signed)
[facility](THN) Care Management  09/11/2014  Clayton QuillJames W Austin [birth year] [id number]   Referral for Community RN from Colleen CanLinda Manning, RN, assigned Irving ShowsJulie Farmer, RN.  Corrie MckusickLisa O. Sharlee BlewMoore, AABA [facility] Care Management [facility] CM Assistant Phone: [phone] Fax: [phone]

## 2014-09-12 ENCOUNTER — Other Ambulatory Visit: Payer: Self-pay | Admitting: *Deleted

## 2014-09-12 NOTE — Patient Outreach (Signed)
09/12/14-  Telephone call to pt to set up initial home visit, spoke with pt, HIPAA verified, initial home visit set up for 09/21/14.

## 2014-09-18 DIAGNOSIS — I2510 Atherosclerotic heart disease of native coronary artery without angina pectoris: Secondary | ICD-10-CM | POA: Diagnosis not present

## 2014-09-18 DIAGNOSIS — I10 Essential (primary) hypertension: Secondary | ICD-10-CM | POA: Diagnosis not present

## 2014-09-21 ENCOUNTER — Encounter: Payer: Self-pay | Admitting: *Deleted

## 2014-09-21 ENCOUNTER — Other Ambulatory Visit: Payer: Self-pay | Admitting: *Deleted

## 2014-09-21 NOTE — Patient Outreach (Signed)
[facility] ([facility] Care Management   09/21/2014  Clayton QuillJames W Austin [birth year] [id number]  Clayton QuillJames W Austin is an [facility]74 y.o. male  Subjective: Initial home visit with pt, HIPAA verified, pt reports he has some swelling in his feet at times but "they are okay today"  Pt states " I could use some help to make sure I'm doing things right"  Objective:   Filed Vitals:   09/21/14 1221  BP: 138/64  Pulse: 65  Resp: 20  Height: 1.816 m (5' 11.5")  Weight: 194 lb (87.998 kg)  SpO2: 97%   ROS  Physical Exam  Constitutional: He is oriented to person, place, and time. He appears well-developed and well-nourished.  HENT:  Head: Normocephalic.  Neck: Normal range of motion.  Cardiovascular: Normal rate.   Irregular rhythym  Respiratory: Breath sounds normal.  GI: Soft. Bowel sounds are normal.  Musculoskeletal: He exhibits no edema.  Neurological: He is alert and oriented to person, place, and time.  Skin: Skin is warm and dry.  Psychiatric: He has a normal mood and affect. His behavior is normal. Judgment and thought content normal.    Current Medications:   Current Outpatient Prescriptions  Medication Sig Dispense Refill  . amLODipine (NORVASC) 10 MG tablet Take 10 mg by mouth daily.    Marland Kitchen. aspirin EC 81 MG tablet Take 1 tablet (81 mg total) by mouth daily.    Marland Kitchen. atorvastatin (LIPITOR) 20 MG tablet Take 20 mg by mouth daily.    Marland Kitchen. levothyroxine (SYNTHROID, LEVOTHROID) 25 MCG tablet Take 1 tablet (25 mcg total) by mouth daily before breakfast.    . metoprolol tartrate (LOPRESSOR) 25 MG tablet Take 25 mg by mouth 2 (two) times daily.  3  . omeprazole (PRILOSEC) 20 MG capsule Take 20 mg by mouth daily.    . tamsulosin (FLOMAX) 0.4 MG CAPS Take 0.4 mg by mouth daily after breakfast.     No current facility-administered medications for this visit.    Functional Status:   In your present state of health, do you have any difficulty performing the following activities: 09/21/2014  09/11/2014  Hearing? N N  Vision? N N  Difficulty concentrating or making decisions? N N  Walking or climbing stairs? N N  Dressing or bathing? N N  Doing errands, shopping? N N  Preparing Food and eating ? N -  Using the Toilet? N -  In the past six months, have you accidently leaked urine? N -  Do you have problems with loss of bowel control? N -  Managing your Medications? N -  Managing your Finances? N -  Housekeeping or managing your Housekeeping? N -    Fall/Depression Screening:    PHQ 2/9 Scores 09/21/2014 09/11/2014  PHQ - 2 Score 0 0    Assessment:  Pt lives with spouse and his sons live nearby and assist when needed.  Pt drives, oversees his own medications,  RN CM observed all bottles and reviewed medications with pt, gave pt prefilled med box for better organization, RN CM encouraged pt to exercise, elevate legs throughout the day, reviewed low sodium food choices.  RN CM faxed today's visit note to Dr. Felecia ShellingFanta along with barrier letter and reported pt taking metoprolol only once daily instead of BID and pt citing he feels dizzy if he takes more than once.    [facility] CM Care Plan Problem One        Patient Outreach from 09/21/2014 in [facility]  Care Plan Problem One  knowledge deficit related to disease process hypertension, CAD   Care Plan for Problem One  Active   [facility]N Long Term Goal (31-90 days)  pt will have blood pressure within normal parameters within 90 days.   [facility] Long Term Goal Start Date  09/21/14   Interventions for Problem One Long Term Goal  RN CM reviewed correlation between hypertension and heart attack and stroke, reviewed signs/ symptoms of of heart attack and stroke., RN CM faxed primary MD Dr. Felecia Shelling and reported pt taking metoprolol only once daily instead of BID as instructed on medication bottle.   [facility] CM Short Term Goal #1 (0-30 days)  pt will verbalize ways to control hypertension within 30 days   [facility] CM Short Term Goal #1 Start Date  09/21/14    Interventions for Short Term Goal #1  RN CM reviewed importance of daily exercising to control hypertension, encouraged pt to walk daily or engage in hobby he enjoys (pt likes golf). RN CM reviewed importance of following low salt diet to control blood pressure.   [facility] CM Short Term Goal #2 (0-30 days)  pt will verbalize foods high in sodium within 30 days.   [facility] CM Short Term Goal #2 Start Date  09/21/14   Interventions for Short Term Goal #2  RN CM reviewed foods high in sodium to limit/ avoid, reviewed EMMI handout with low salt food choices and high sodium foods to avoid.      Plan: follow up with home visit 10/24/14 Continue hypertension, CAD teaching, diet teaching  Irving Shows [facility], BSN [facility] Care Coordinator [phone]

## 2014-10-05 DIAGNOSIS — T82898A Other specified complication of vascular prosthetic devices, implants and grafts, initial encounter: Secondary | ICD-10-CM | POA: Diagnosis not present

## 2014-10-05 DIAGNOSIS — N400 Enlarged prostate without lower urinary tract symptoms: Secondary | ICD-10-CM | POA: Diagnosis not present

## 2014-10-05 DIAGNOSIS — R739 Hyperglycemia, unspecified: Secondary | ICD-10-CM | POA: Diagnosis not present

## 2014-10-05 DIAGNOSIS — I10 Essential (primary) hypertension: Secondary | ICD-10-CM | POA: Diagnosis not present

## 2014-10-05 DIAGNOSIS — E784 Other hyperlipidemia: Secondary | ICD-10-CM | POA: Diagnosis not present

## 2014-10-05 DIAGNOSIS — I2510 Atherosclerotic heart disease of native coronary artery without angina pectoris: Secondary | ICD-10-CM | POA: Diagnosis not present

## 2014-10-24 ENCOUNTER — Ambulatory Visit: Payer: Self-pay | Admitting: *Deleted

## 2014-10-24 ENCOUNTER — Other Ambulatory Visit: Payer: Self-pay | Admitting: *Deleted

## 2014-10-24 NOTE — Patient Outreach (Signed)
10/24/14- pt called RN CM and cancelled today's scheduled home visit citing "out of town",  Home visit rescheduled for 11/01/14.  Irving ShowsJulie Dalia Jollie [facility], BSN [facility] Community Care Coordinator [phone]

## 2014-11-01 ENCOUNTER — Encounter: Payer: Self-pay | Admitting: *Deleted

## 2014-11-01 ENCOUNTER — Other Ambulatory Visit: Payer: Self-pay | Admitting: *Deleted

## 2014-11-01 NOTE — Patient Outreach (Signed)
[facility]) Care Management   11/01/2014  Clayton Austin 1940/05/01 [id number]  Clayton Austin is an 74 y.o. male  Subjective: Routine home visit with pt, HIPAA verified, wife present and reports "he's not following low salt diet"  Pt states he is trying to do better and states he eats ice  cream at least twice daily. Pt saw Dr. Felecia Shelling 10/04/14, received pneumonia vaccine, no changes made, no medication changes.  Objective:   Filed Vitals:   11/01/14 1409  BP: 142/62  Pulse: 66  Resp: 18  weight 194 pounds  ROS  Physical Exam  Constitutional: He is oriented to person, place, and time. He appears well-developed.  HENT:  Head: Normocephalic.  Neck: Normal range of motion.  Cardiovascular: Normal rate.   Irregular rhythym  Respiratory: Effort normal and breath sounds normal.  GI: Soft. Bowel sounds are normal.  Musculoskeletal: Normal range of motion. He exhibits no edema.  Neurological: He is alert and oriented to person, place, and time.  Skin: Skin is warm and dry.  Psychiatric: He has a normal mood and affect. His behavior is normal. Judgment and thought content normal.    Current Medications:   Current Outpatient Prescriptions  Medication Sig Dispense Refill  . amLODipine (NORVASC) 10 MG tablet Take 10 mg by mouth daily.    Marland Kitchen aspirin EC 81 MG tablet Take 1 tablet (81 mg total) by mouth daily.    Marland Kitchen atorvastatin (LIPITOR) 20 MG tablet Take 20 mg by mouth daily.    Marland Kitchen levothyroxine (SYNTHROID, LEVOTHROID) 25 MCG tablet Take 1 tablet (25 mcg total) by mouth daily before breakfast.    . metoprolol tartrate (LOPRESSOR) 25 MG tablet Take 25 mg by mouth 2 (two) times daily.  3  . omeprazole (PRILOSEC) 20 MG capsule Take 20 mg by mouth daily.    . tamsulosin (FLOMAX) 0.4 MG CAPS Take 0.4 mg by mouth daily after breakfast.     No current facility-administered medications for this visit.    Functional Status:   In your present state of health, do you have  any difficulty performing the following activities: 09/21/2014 09/11/2014  Hearing? N N  Vision? N N  Difficulty concentrating or making decisions? N N  Walking or climbing stairs? N N  Dressing or bathing? N N  Doing errands, shopping? N N  Preparing Food and eating ? N -  Using the Toilet? N -  In the past six months, have you accidently leaked urine? N -  Do you have problems with loss of bowel control? N -  Managing your Medications? N -  Managing your Finances? N -  Housekeeping or managing your Housekeeping? N -    Fall/Depression Screening:    PHQ 2/9 Scores 09/21/2014 09/11/2014  PHQ - 2 Score 0 0    Assessment:  Patient's wife upset that pt "doesn't do what he's supposed to", she and pt disagree about what pt really does eat.  RN CM reviewed with pt and wife about slowly cutting down on certain foods, for example, eating smaller servings of ice cream, gave examples of using spices, herbs for food (wife says she already cooks this way), RN CM reviewed with pt that bologna and processed meats are high in sodium as pt likes to eat these.  [facility] CM Care Plan Problem One        Patient Outreach from 11/01/2014 in [facility] [employer]   Care Plan Problem One  knowledge deficit related to disease  process hypertension, CAD   Care Plan for Problem One  Active   THN Long Term Goal (31-90 days)  pt will have blood pressure within normal parameters within 90 days.   THN Long Term Goal Start Date  09/21/14   Interventions for Problem One Long Term Goal  RN CM reinforced correlation between hypertension and heart attack and stroke, reviewed signs/ symptoms of of heart attack and stroke.   THN CM Short Term Goal #1 (0-30 days)  pt will verbalize ways to control hypertension within 30 days   THN CM Short Term Goal #1 Start Date  11/01/14 Staci Righter[goal restarted- needs reinforcement]   Interventions for Short Term Goal #1  RN CM reinforced importance of daily exercising to control hypertension, encouraged  pt to walk daily or engage in hobby he enjoys (pt likes golf). RN CM reviewed with pt and wife importance of following low salt diet to control blood pressure.   THN CM Short Term Goal #2 (0-30 days)  pt will verbalize foods high in sodium within 30 days.   THN CM Short Term Goal #2 Start Date  11/01/14 [goal restarted- pt needs reinforcement]   Interventions for Short Term Goal #2  RN CM reviewed foods high in sodium to limit/ avoid, reviewed EMMI handouts: CAD-what you can do,  CAD and blood pressure, High blood pressure- health problems, High blood pressure      Plan: see pt for routine home visit 11/28/14 Continue CAD and hypertension teaching, reinforcement, low salt diet.  Irving ShowsJulie Farmer [facility], BSN [facility] Coordinator [phone]

## 2014-11-03 DIAGNOSIS — R339 Retention of urine, unspecified: Secondary | ICD-10-CM | POA: Diagnosis not present

## 2014-11-03 DIAGNOSIS — N401 Enlarged prostate with lower urinary tract symptoms: Secondary | ICD-10-CM | POA: Diagnosis not present

## 2014-11-03 DIAGNOSIS — R338 Other retention of urine: Secondary | ICD-10-CM | POA: Diagnosis not present

## 2014-11-03 DIAGNOSIS — Z79899 Other long term (current) drug therapy: Secondary | ICD-10-CM | POA: Diagnosis not present

## 2014-11-03 DIAGNOSIS — K219 Gastro-esophageal reflux disease without esophagitis: Secondary | ICD-10-CM | POA: Diagnosis not present

## 2014-11-03 DIAGNOSIS — E780 Pure hypercholesterolemia: Secondary | ICD-10-CM | POA: Diagnosis not present

## 2014-11-03 DIAGNOSIS — I10 Essential (primary) hypertension: Secondary | ICD-10-CM | POA: Diagnosis not present

## 2014-11-04 DIAGNOSIS — I2510 Atherosclerotic heart disease of native coronary artery without angina pectoris: Secondary | ICD-10-CM | POA: Diagnosis not present

## 2014-11-04 DIAGNOSIS — I10 Essential (primary) hypertension: Secondary | ICD-10-CM | POA: Diagnosis not present

## 2014-11-28 ENCOUNTER — Encounter: Payer: Self-pay | Admitting: *Deleted

## 2014-11-28 ENCOUNTER — Other Ambulatory Visit: Payer: Self-pay | Admitting: *Deleted

## 2014-11-28 NOTE — Patient Outreach (Signed)
[facility]) Care Management   11/28/2014  Clayton Austin 1941/01/24 [id number]  Clayton Austin is an 74 y.o. male  Subjective: Routine home visit with pt, wife present and states " he's doing so much better about his salt intake"  Pt reports he is taking all medications as prescribed.  Objective:   Filed Vitals:   11/28/14 1424  BP: 118/64  Pulse: 70  Resp: 16    ROS  Physical Exam  Constitutional: He is oriented to person, place, and time. He appears well-developed and well-nourished.  HENT:  Head: Normocephalic.  Neck: Normal range of motion.  Cardiovascular: Normal rate and regular rhythm.   Respiratory: Effort normal and breath sounds normal.  GI: Soft. Bowel sounds are normal.  Musculoskeletal: Normal range of motion. He exhibits no edema.  Neurological: He is alert and oriented to person, place, and time.  Skin: Skin is warm and dry.  Psychiatric: He has a normal mood and affect. His behavior is normal. Judgment and thought content normal.    Current Medications:   Current Outpatient Prescriptions  Medication Sig Dispense Refill  . amLODipine (NORVASC) 10 MG tablet Take 10 mg by mouth daily.    Marland Kitchen aspirin EC 81 MG tablet Take 1 tablet (81 mg total) by mouth daily.    Marland Kitchen atorvastatin (LIPITOR) 20 MG tablet Take 20 mg by mouth daily.    Marland Kitchen levothyroxine (SYNTHROID, LEVOTHROID) 25 MCG tablet Take 1 tablet (25 mcg total) by mouth daily before breakfast.    . metoprolol tartrate (LOPRESSOR) 25 MG tablet Take 25 mg by mouth 2 (two) times daily.  3  . omeprazole (PRILOSEC) 20 MG capsule Take 20 mg by mouth daily.    . tamsulosin (FLOMAX) 0.4 MG CAPS Take 0.4 mg by mouth daily after breakfast.     No current facility-administered medications for this visit.    Functional Status:   In your present state of health, do you have any difficulty performing the following activities: 09/21/2014 09/11/2014  Hearing? N N  Vision? N N  Difficulty concentrating or  making decisions? N N  Walking or climbing stairs? N N  Dressing or bathing? N N  Doing errands, shopping? N N  Preparing Food and eating ? N -  Using the Toilet? N -  In the past six months, have you accidently leaked urine? N -  Do you have problems with loss of bowel control? N -  Managing your Medications? N -  Managing your Finances? N -  Housekeeping or managing your Housekeeping? N -    Fall/Depression Screening:    PHQ 2/9 Scores 11/28/2014 09/21/2014 09/11/2014  PHQ - 2 Score 0 0 0    Assessment:  RN CM discussed discharge plan and pt feels he has met all his goals and no further goals to work towards at present.  Pt is going to try to exercise more, maybe with his wife.  Blood pressure is improved today, RN CM praised and encouraged pt for his efforts to lower salt intake which has had a positive impact on his blood pressure.  Wife states she may start checking patient's blood pressure as well as her own.  RN CM faxed case closure letter to primary MD Dr. Felecia Shelling.  Pt is not interested in Ecologist as he feels his goals have been met.  [facility] CM Care Plan Problem One        Patient Outreach from 11/28/2014 in [employer]  Care Plan Problem One  knowledge deficit related to disease process hypertension, CAD   Care Plan for Problem One  Active   THN Long Term Goal (31-90 days)  pt will have blood pressure within normal parameters within 90 days.   THN Long Term Goal Start Date  09/21/14   THN Long Term Goal Met Date  11/28/14   Interventions for Problem One Long Term Goal  RN CM reinforced and reviewed correlation between hypertension and heart attack and stroke, reviewed signs/ symptoms of of heart attack and stroke.   THN CM Short Term Goal #1 (0-30 days)  pt will verbalize ways to control hypertension within 30 days   THN CM Short Term Goal #1 Start Date  11/01/14 Barrie Folk restarted- needs reinforcement]   THN CM Short Term Goal #1 Met Date  11/28/14   Interventions for  Short Term Goal #1  RN CM reinforced and reviewed importance of daily exercising to control hypertension, encouraged pt to walk daily or engage in hobby he enjoys (pt likes golf), suggested stationary bike, RN CM reviewed with pt and wife importance of following low salt diet to control blood pressure.   THN CM Short Term Goal #2 (0-30 days)  pt will verbalize foods high in sodium within 30 days.   THN CM Short Term Goal #2 Start Date  11/01/14 [goal restarted- pt needs reinforcement]   THN CM Short Term Goal #2 Met Date  11/28/14   Interventions for Short Term Goal #2  RN CM reinforced foods high in sodium to limit/avoid and reminded that most restaurant food has alot of added sodium.     Plan: Discharge today  [name] [facility], BSN [place] Coordinator [phone]

## 2014-12-05 DIAGNOSIS — I10 Essential (primary) hypertension: Secondary | ICD-10-CM | POA: Diagnosis not present

## 2014-12-05 DIAGNOSIS — I2510 Atherosclerotic heart disease of native coronary artery without angina pectoris: Secondary | ICD-10-CM | POA: Diagnosis not present

## 2014-12-05 NOTE — Patient Outreach (Signed)
[facility]) Care Management  11/28/2014  JAYVIER BURGHER 02-Jan-1941 [id number]   Notification from [name], RN to close case due to goals met with [place] Management.  Thanks, Ronnell Freshwater. [place], [place] Assistant Phone: [phone] Fax: [phone]

## 2015-01-05 DIAGNOSIS — I2510 Atherosclerotic heart disease of native coronary artery without angina pectoris: Secondary | ICD-10-CM | POA: Diagnosis not present

## 2015-01-05 DIAGNOSIS — I10 Essential (primary) hypertension: Secondary | ICD-10-CM | POA: Diagnosis not present

## 2015-02-04 DIAGNOSIS — I2510 Atherosclerotic heart disease of native coronary artery without angina pectoris: Secondary | ICD-10-CM | POA: Diagnosis not present

## 2015-02-04 DIAGNOSIS — I10 Essential (primary) hypertension: Secondary | ICD-10-CM | POA: Diagnosis not present

## 2015-02-23 IMAGING — CR DG CHEST 1V PORT
1 series · 1 of 1 positions shown · non-contrast
Comparison: 08/15/2012.

CLINICAL DATA: Chest pain.  Coronary artery disease.  CABG.

PORTABLE CHEST - 1 VIEW

[AP]
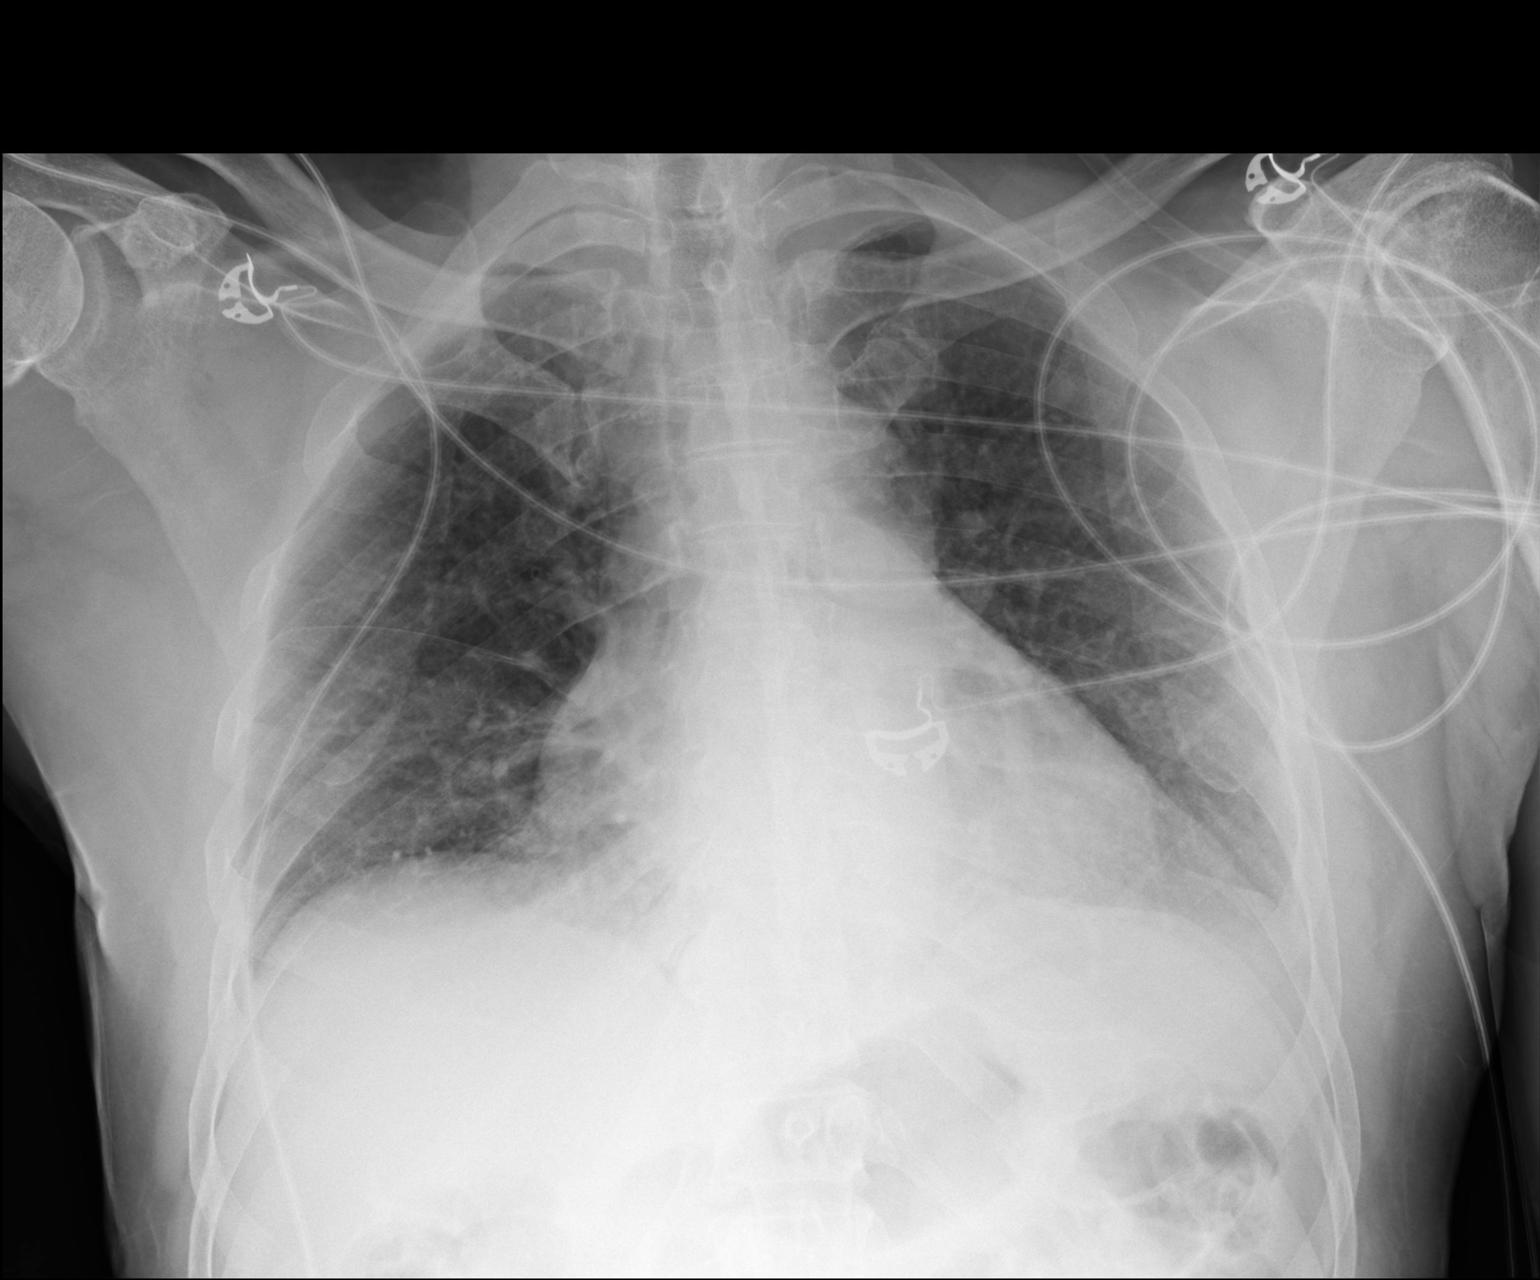

[1 of 1 positions shown; findings below may reference images not displayed]

FINDINGS: Low volume chest.  Basilar atelectasis.  Low volumes
accentuate the cardiopericardial silhouette. Monitoring leads are
projected over the chest.  Stable bilateral pleural apical
thickening.  Mediastinal contours appear within normal limits
allowing for lung volumes. No pleural effusion or airspace disease.
IMPRESSION: Low volume chest.

## 2015-02-24 IMAGING — CR DG CHEST 2V
2 series · 2 of 2 positions shown · non-contrast
Comparison: 08/16/2009

CLINICAL DATA: Preop heart surgery.

CHEST - 2 VIEW

[x chest ap]
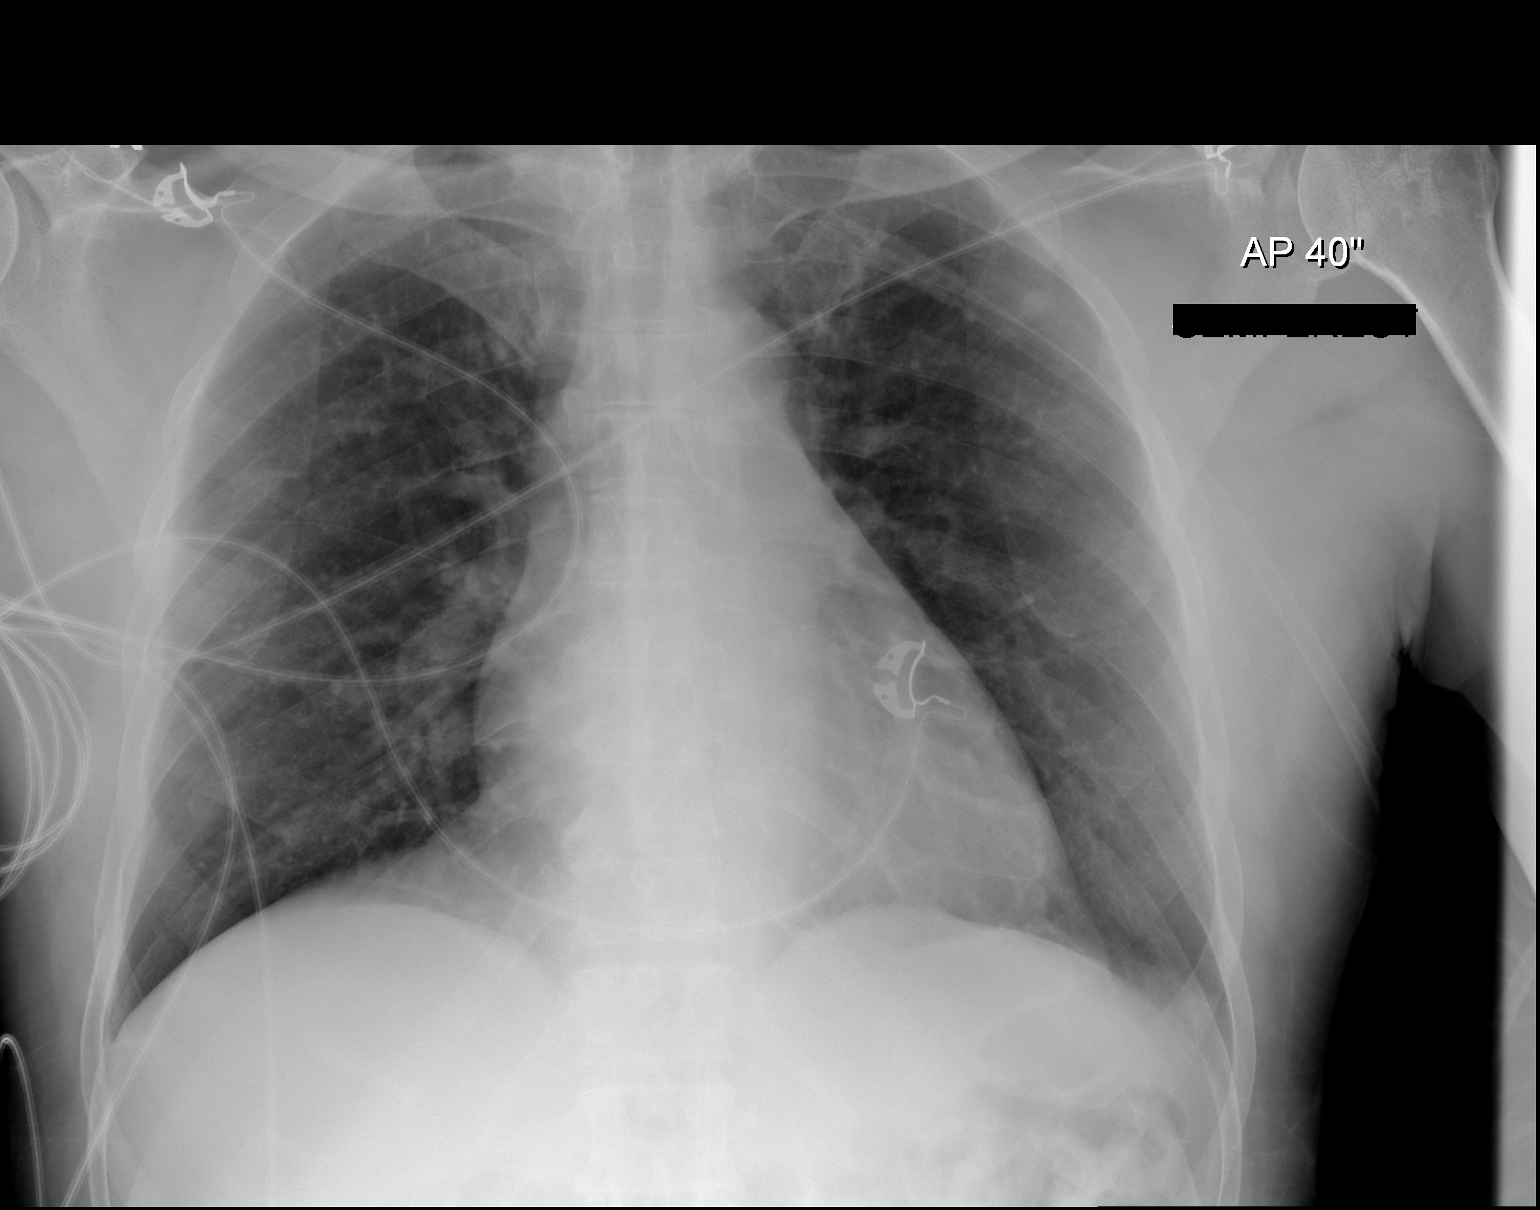

[w chest lat]
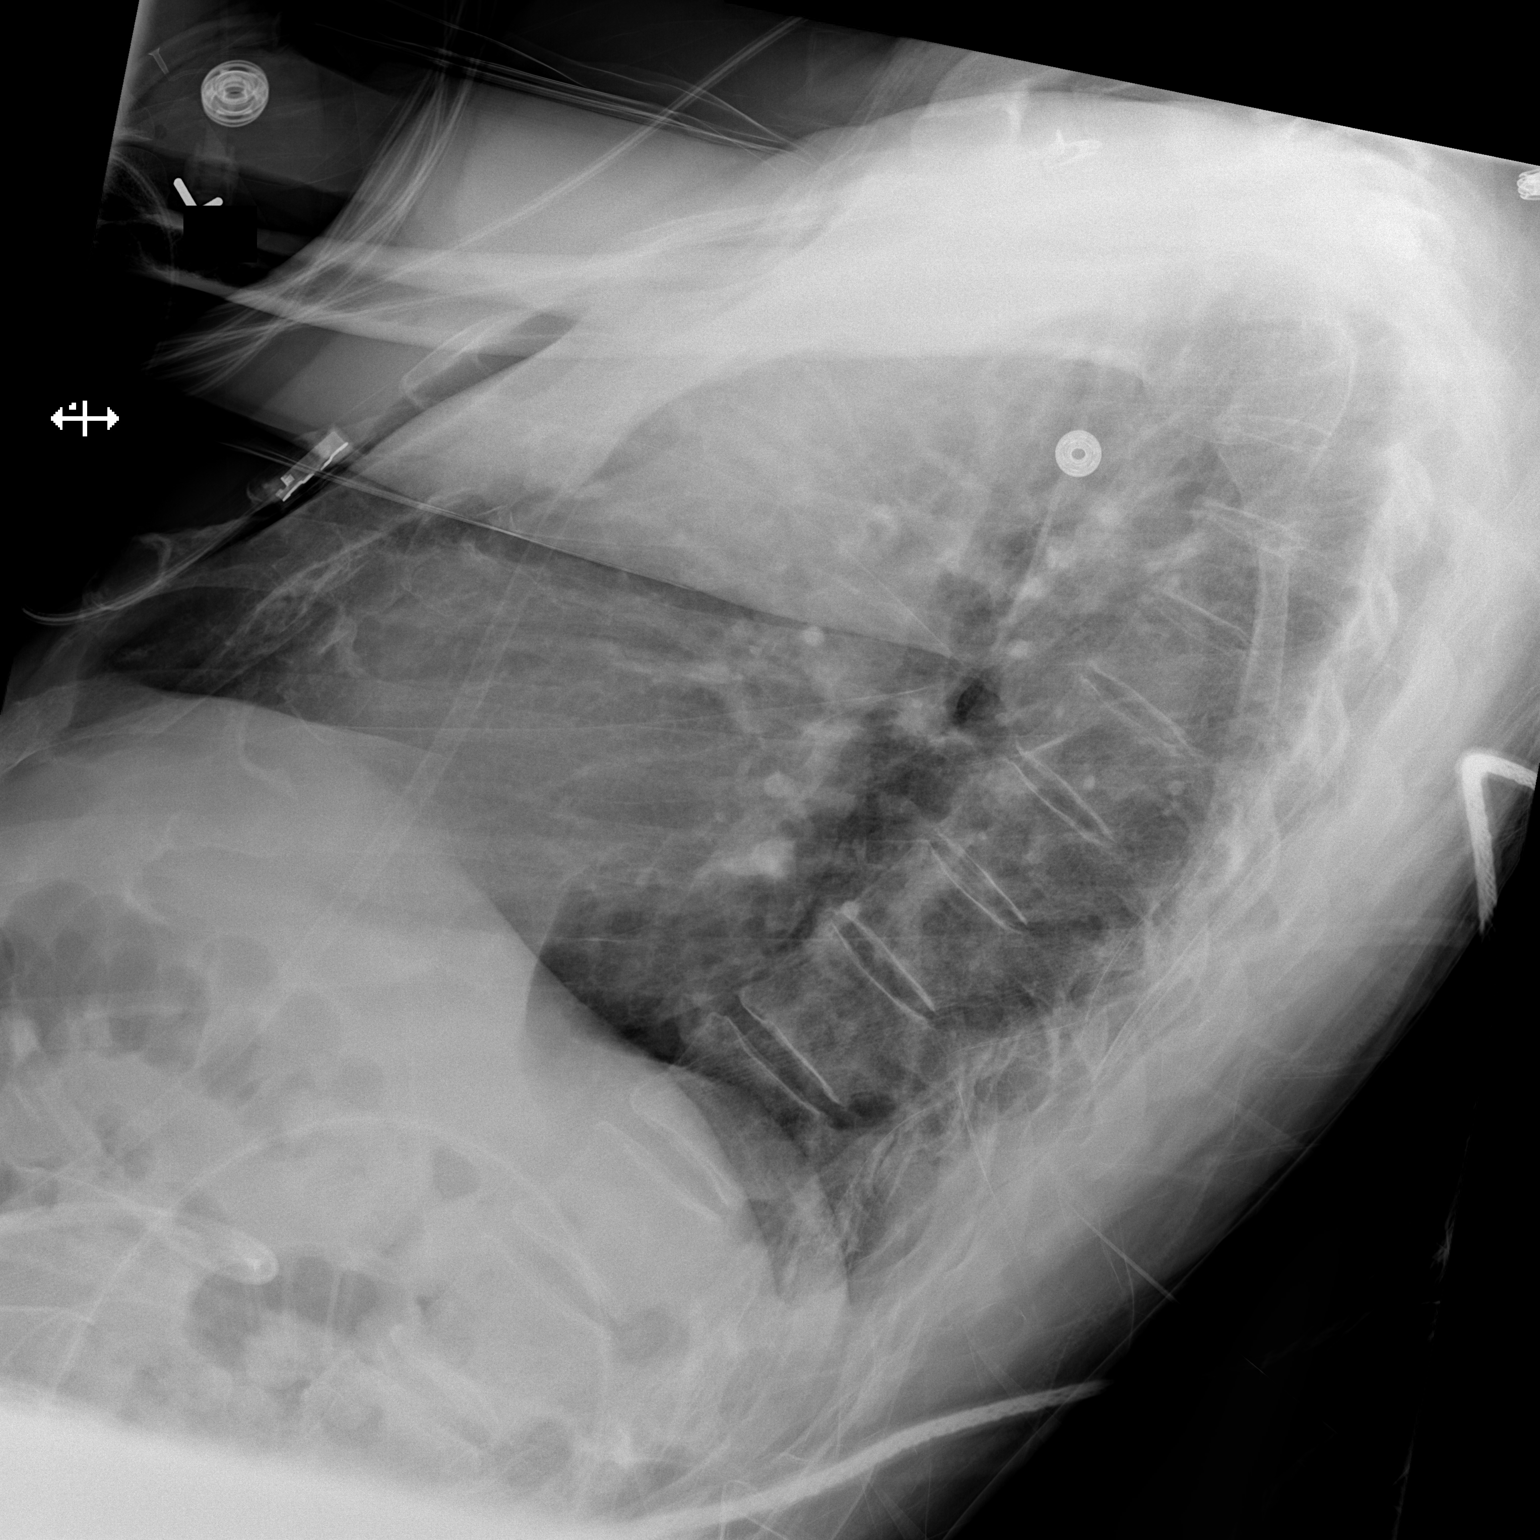

[2 of 2 positions shown; findings below may reference images not displayed]

FINDINGS: Two views of the chest were obtained.  There is improved
aeration of the lungs.  No focal airspace disease or edema.
However, there is a 1 cm nodular density in the left upper chest.
This is concerning for a pulmonary nodule.  The heart size is
within normal limits.  Trachea is midline.  Bony thorax is intact.
IMPRESSION: No acute cardiopulmonary disease.

1 cm nodular density in the left upper chest.  Recommend further
evaluation with a chest CT.

These results will be called to the ordering clinician or
representative by the Radiologist Assistant, and communication
documented in the PACS Dashboard.

## 2015-02-25 IMAGING — CR DG CHEST 1V PORT
1 series · 1 of 1 positions shown · non-contrast
Comparison: August 17, 2012

CLINICAL DATA: Postoperative evaluation; status post extubation

PORTABLE CHEST - 1 VIEW

[AP]
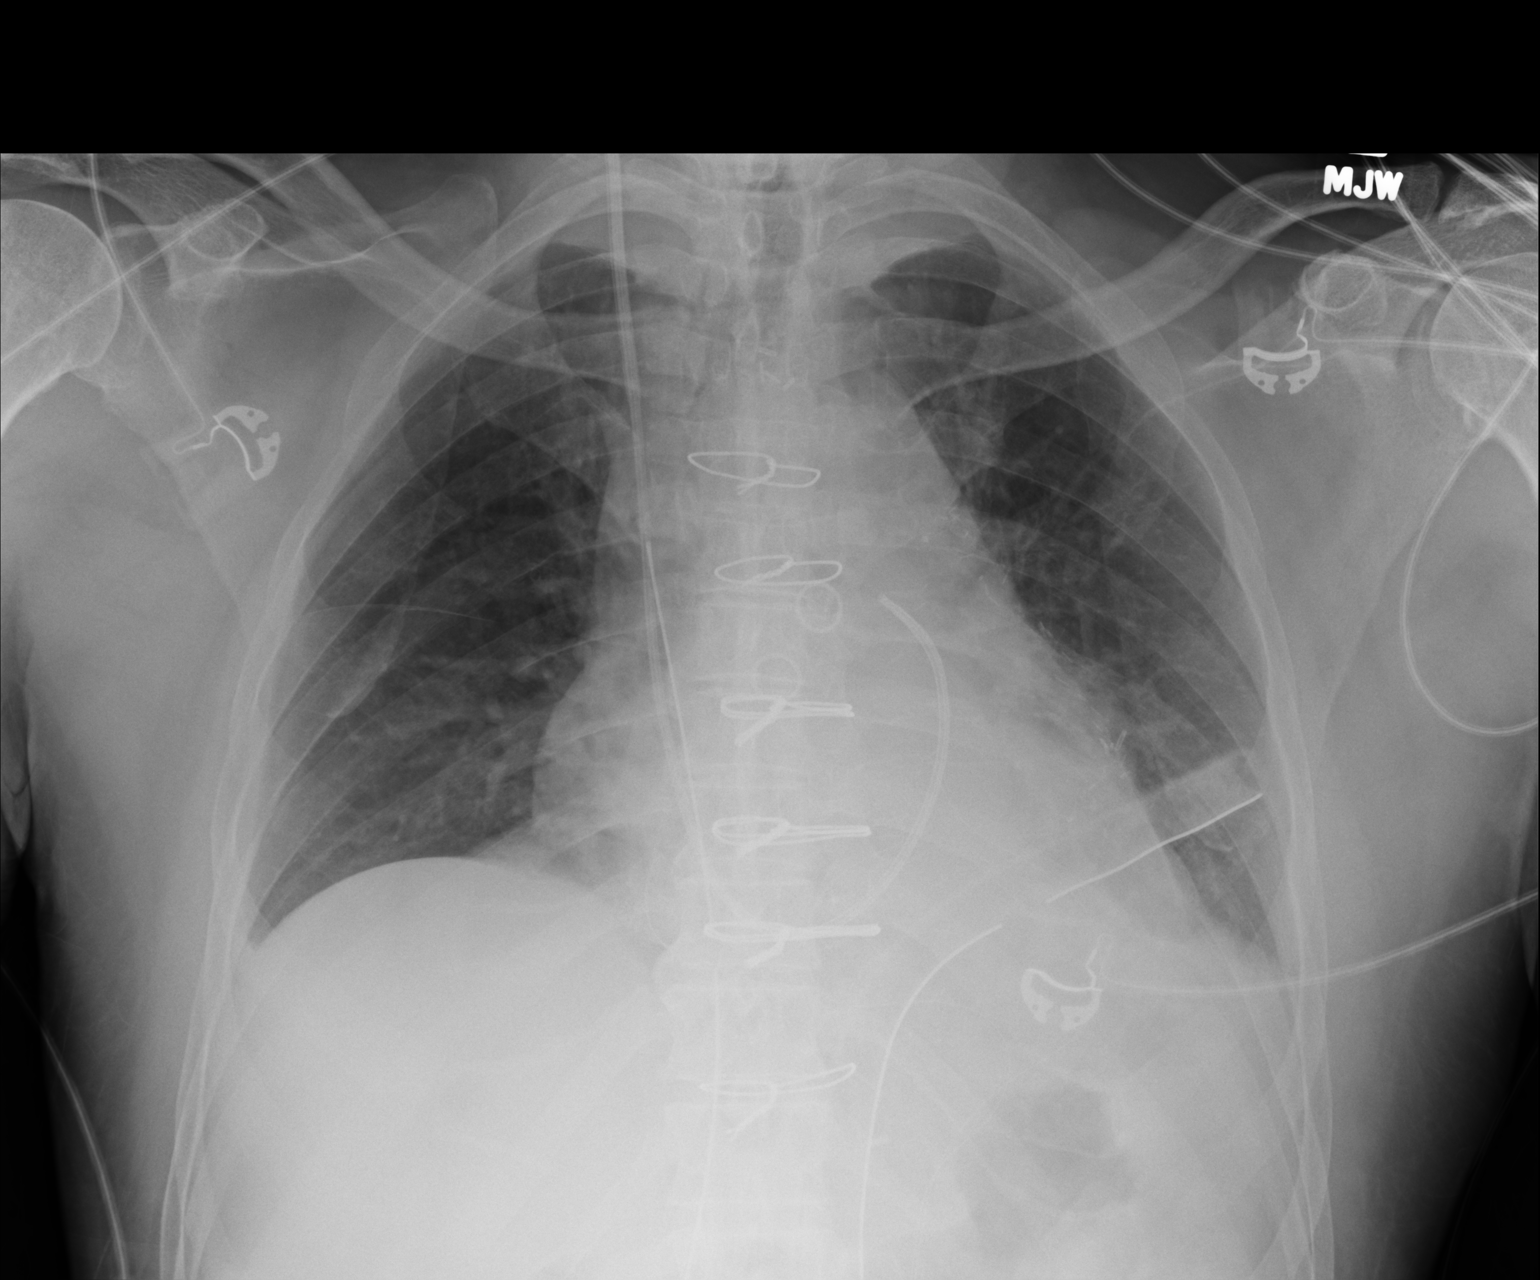

[1 of 1 positions shown; findings below may reference images not displayed]

FINDINGS: Endotracheal tube has been removed.  Left chest tube and
mediastinal drain remain in place.  Central catheter tip is in the
main pulmonary outflow tract.  No pneumothorax.

The lungs are clear.  Heart is mildly prominent in size with normal
pulmonary vascularity.  No adenopathy.  The patient is status post
coronary bypass grafting.
IMPRESSION: Lungs clear.  No pneumothorax.

## 2015-02-26 IMAGING — CR DG CHEST 1V PORT
1 series · 1 of 1 positions shown · non-contrast
Comparison: August 18, 2012.

CLINICAL DATA: Status post cardiac surgery

PORTABLE CHEST - 1 VIEW

[AP]
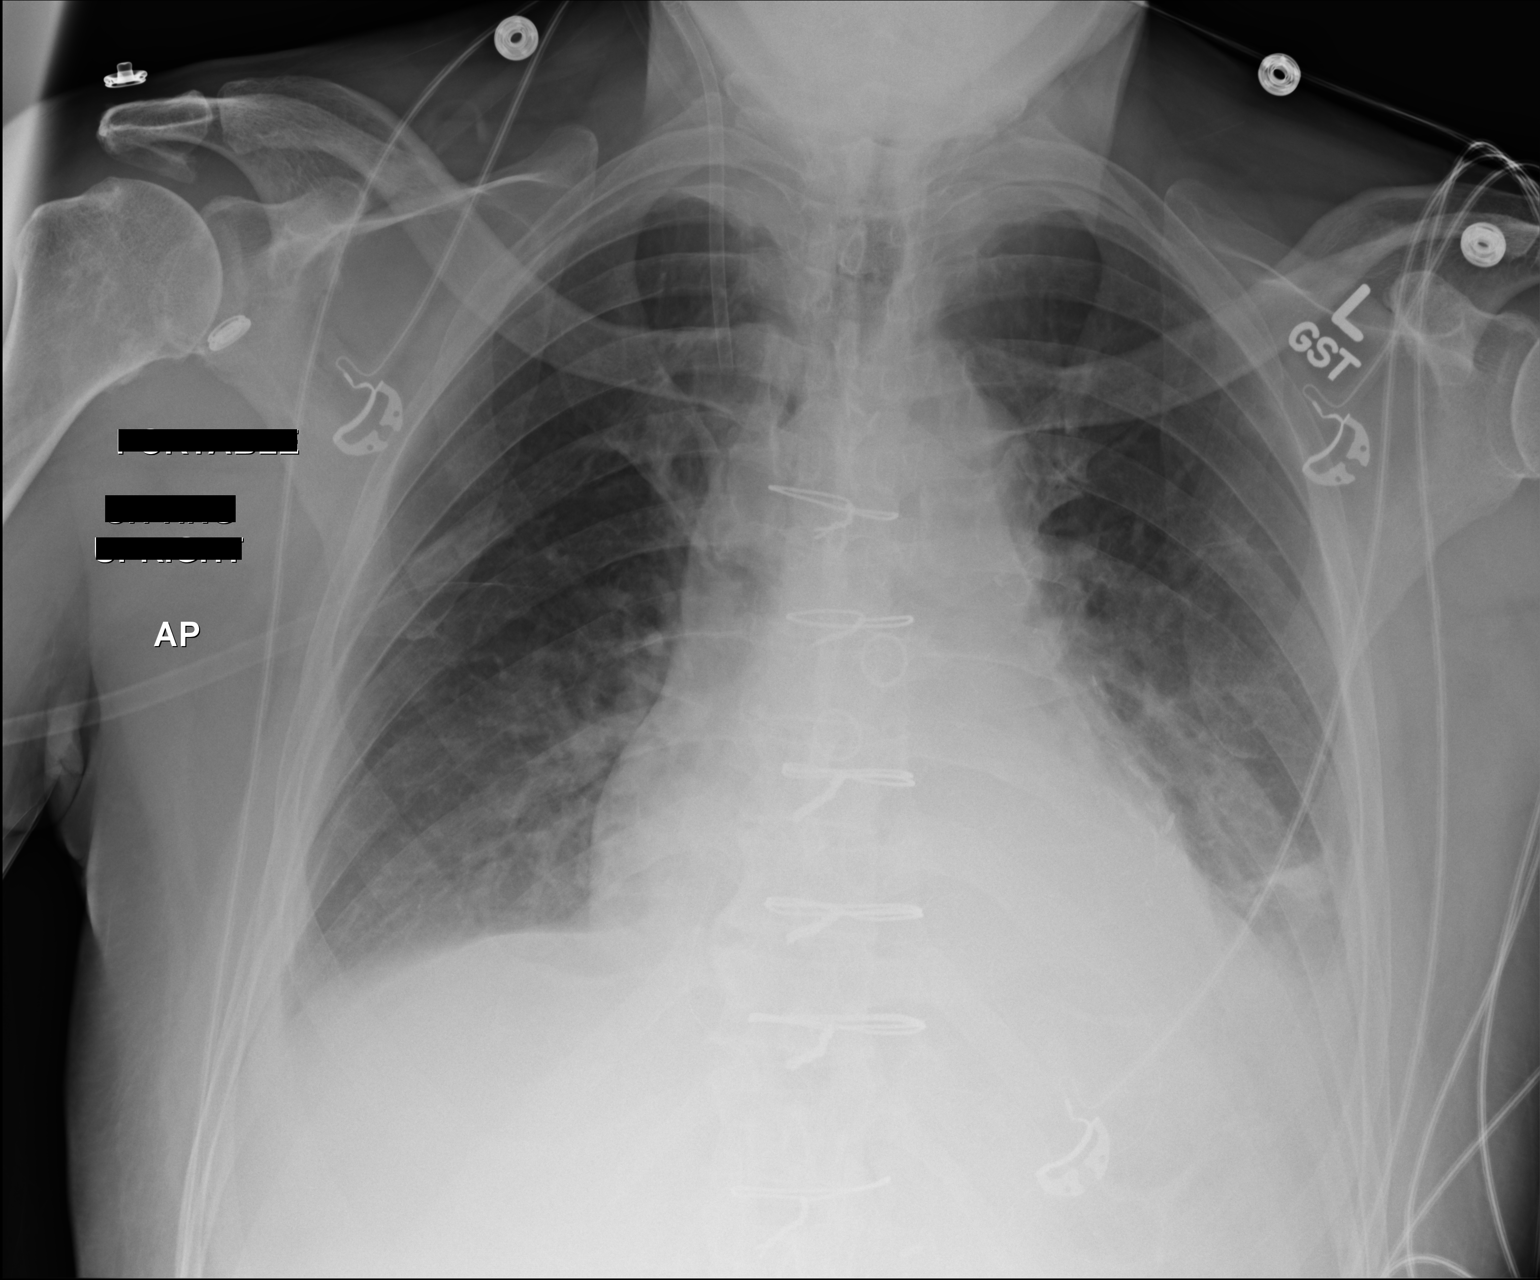

[1 of 1 positions shown; findings below may reference images not displayed]

FINDINGS: Stable mild cardiomegaly.  Sternotomy wires are noted.
Swan-Ganz catheter has been removed.  Left-sided chest tube has
been removed without pneumothorax.  Left basilar subsegmental
atelectasis remains with associated pleural effusion.  Right lung
is clear.
IMPRESSION: Swan-Ganz catheter and left-sided chest tube have been removed.  No
pneumothorax is seen.  Left basilar subsegmental atelectasis with
associated pleural effusion remains.

## 2015-03-07 DIAGNOSIS — I10 Essential (primary) hypertension: Secondary | ICD-10-CM | POA: Diagnosis not present

## 2015-03-07 DIAGNOSIS — I2510 Atherosclerotic heart disease of native coronary artery without angina pectoris: Secondary | ICD-10-CM | POA: Diagnosis not present

## 2015-03-12 DIAGNOSIS — N320 Bladder-neck obstruction: Secondary | ICD-10-CM | POA: Diagnosis not present

## 2015-03-12 DIAGNOSIS — N5201 Erectile dysfunction due to arterial insufficiency: Secondary | ICD-10-CM | POA: Diagnosis not present

## 2015-03-12 DIAGNOSIS — R351 Nocturia: Secondary | ICD-10-CM | POA: Diagnosis not present

## 2015-03-12 DIAGNOSIS — N401 Enlarged prostate with lower urinary tract symptoms: Secondary | ICD-10-CM | POA: Diagnosis not present

## 2015-03-12 DIAGNOSIS — N138 Other obstructive and reflux uropathy: Secondary | ICD-10-CM | POA: Diagnosis not present

## 2015-04-19 DIAGNOSIS — I2510 Atherosclerotic heart disease of native coronary artery without angina pectoris: Secondary | ICD-10-CM | POA: Diagnosis not present

## 2015-04-19 DIAGNOSIS — N400 Enlarged prostate without lower urinary tract symptoms: Secondary | ICD-10-CM | POA: Diagnosis not present

## 2015-04-19 DIAGNOSIS — T82898A Other specified complication of vascular prosthetic devices, implants and grafts, initial encounter: Secondary | ICD-10-CM | POA: Diagnosis not present

## 2015-04-19 DIAGNOSIS — E784 Other hyperlipidemia: Secondary | ICD-10-CM | POA: Diagnosis not present

## 2015-04-19 DIAGNOSIS — I10 Essential (primary) hypertension: Secondary | ICD-10-CM | POA: Diagnosis not present

## 2015-05-20 DIAGNOSIS — I2510 Atherosclerotic heart disease of native coronary artery without angina pectoris: Secondary | ICD-10-CM | POA: Diagnosis not present

## 2015-05-20 DIAGNOSIS — I10 Essential (primary) hypertension: Secondary | ICD-10-CM | POA: Diagnosis not present

## 2015-06-04 ENCOUNTER — Ambulatory Visit: Payer: Medicare HMO | Admitting: Cardiovascular Disease

## 2015-06-18 ENCOUNTER — Encounter: Payer: Self-pay | Admitting: *Deleted

## 2015-06-18 ENCOUNTER — Ambulatory Visit (INDEPENDENT_AMBULATORY_CARE_PROVIDER_SITE_OTHER): Payer: Commercial Managed Care - HMO | Admitting: Cardiovascular Disease

## 2015-06-18 ENCOUNTER — Ambulatory Visit: Payer: Medicare HMO | Admitting: Cardiovascular Disease

## 2015-06-18 ENCOUNTER — Encounter: Payer: Self-pay | Admitting: Cardiovascular Disease

## 2015-06-18 VITALS — BP 150/80 | HR 95 | Ht 71.0 in | Wt 196.0 lb

## 2015-06-18 DIAGNOSIS — I25812 Atherosclerosis of bypass graft of coronary artery of transplanted heart without angina pectoris: Secondary | ICD-10-CM

## 2015-06-18 DIAGNOSIS — I10 Essential (primary) hypertension: Secondary | ICD-10-CM

## 2015-06-18 DIAGNOSIS — E785 Hyperlipidemia, unspecified: Secondary | ICD-10-CM

## 2015-06-18 DIAGNOSIS — I252 Old myocardial infarction: Secondary | ICD-10-CM

## 2015-06-18 NOTE — Patient Instructions (Signed)
   Continue your Metoprolol at twice a day  Your physician wants you to follow up in:  1 year.  You will receive a reminder letter in the mail one-two months in advance.  If you don't receive a letter, please call our office to schedule the follow up appointment

## 2015-06-18 NOTE — Progress Notes (Addendum)
Patient ID: Clayton Austin, male   DOB: 1940-10-05, 75 y.o.   MRN: [id number]      SUBJECTIVE:  The patient presents for routine follow up. He went underwent a normal exercise treadmill stress test which was deemed low risk in December 2014 with a Duke treadmill score of 5.5. He has a history of coronary artery disease and CABG in 2014 after sustaining a non-STEMI. He also has a history of hypertension and hyperlipidemia. He has normal left ventricular systolic function. He is a Naval architect.  He is doing well and denies exertional chest pain and dyspnea. Also denies leg swelling and orthopnea. Can climb a flight of stairs without difficulty. He likes to stay active.  ECG today shows sinus rhythm with a right bundle branch block which is chronic.   Review of Systems: As per "subjective", otherwise negative.  No Known Allergies  Current Outpatient Prescriptions  Medication Sig Dispense Refill  . amLODipine (NORVASC) 10 MG tablet Take 10 mg by mouth daily.    Marland Kitchen aspirin EC 81 MG tablet Take 1 tablet (81 mg total) by mouth daily.    Marland Kitchen atorvastatin (LIPITOR) 20 MG tablet Take 20 mg by mouth daily.    Marland Kitchen levothyroxine (SYNTHROID, LEVOTHROID) 25 MCG tablet Take 1 tablet (25 mcg total) by mouth daily before breakfast.    . metoprolol tartrate (LOPRESSOR) 25 MG tablet Take 25 mg by mouth 2 (two) times daily.  3  . omeprazole (PRILOSEC) 20 MG capsule Take 20 mg by mouth daily.    . tamsulosin (FLOMAX) 0.4 MG CAPS Take 0.4 mg by mouth daily after breakfast.     No current facility-administered medications for this visit.    Past Medical History  Diagnosis Date  . Hypertension   . Hyperlipidemia   . Benign prostatic hypertrophy   . Coronary artery disease     a. NSTEMI 07/2012 => critical LM disease on LHC => s/p CABG (L-LAD/D1, S-OM2, S-PDA/PL);  EF 55-60% at [facility] 07/2012  . Hypothyroidism   . Atrial fibrillation (HCC)     post op => amiodarone Rx    Past Surgical History  Procedure  Laterality Date  . Prostate surgery  06/2012  . Cystoscopy N/A 08/17/2012    Procedure: CYSTOSCOPY FLEXIBLE for difficult  foley catheter insertion;  Surgeon: Sebastian Ache, MD;  Location[facility] OR;  Service: Urology;  Laterality: N/A;  . Coronary artery bypass graft N/A 08/17/2012    Procedure: CORONARY ARTERY BYPASS GRAFTING (CABG);  Surgeon: Loreli Slot, MD;  Location: [facility] OR;  Service: Open Heart Surgery;  Laterality: N/A;  . Left heart catheterization with coronary angiogram N/A 08/16/2012    Procedure: LEFT HEART CATHETERIZATION WITH CORONARY ANGIOGRAM;  Surgeon: Tonny Bollman, MD;  Location: [facility] CATH LAB;  Service: Cardiovascular;  Laterality: N/A;    Social History   Social History  . Marital Status: Married    Spouse Name: N/A  . Number of Children: N/A  . Years of Education: N/A   Occupational History  . Not on file.   Social History Main Topics  . Smoking status: Former Smoker -- 0.50 packs/day for 10 years    Types: Cigarettes    Start date: 08/05/1960    Quit date: 08/06/1970  . Smokeless tobacco: Never Used  . Alcohol Use: No  . Drug Use: No  . Sexual Activity: Not on file   Other Topics Concern  . Not on file   Social History Narrative     Filed Vitals:  06/18/15 0854  BP: 150/80  Pulse: 95  Height:  (1.803 m)  Weight: 196 lb (88.905 kg)  SpO2: 91%    PHYSICAL EXAM General: NAD HEENT: Normal. Neck: No JVD, no thyromegaly. Lungs: Clear to auscultation bilaterally with normal respiratory effort. CV: Nondisplaced PMI.  Regular rate and rhythm, normal S1/S2, no S3/S4, no murmur. No pretibial or periankle edema.  No carotid bruit.   Abdomen: Soft, nontender, no distention.  Neurologic: Alert and oriented.  Psych: Normal affect. Skin: Normal. Musculoskeletal: No gross deformities.  ECG: Most recent ECG reviewed.      ASSESSMENT AND PLAN: 1. CAD with history of CABG and NSTEMI: Stable ischemic heart disease. Continue metoprolol  (encouraged to take twice daily) and Lipitor along with ASA 81 mg. Does not require any noninvasive testing at this time. 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  2. Hyperlipidemia: Will obtain copy of lipids from PCP's office. Continue Lipitor 20 mg daily.  3. Essential hypertension: Elevated. Encouraged to monitor BP 1-2 times monthly and if it remains elevated, I will adjust meds. Also encouraged to take metoprolol BID rather than once daily as he has been doing.  Dispo: f/u 1 year.   Prentice Docker, M.D., F.A.C.C.

## 2015-06-20 DIAGNOSIS — I10 Essential (primary) hypertension: Secondary | ICD-10-CM | POA: Diagnosis not present

## 2015-06-20 DIAGNOSIS — I2510 Atherosclerotic heart disease of native coronary artery without angina pectoris: Secondary | ICD-10-CM | POA: Diagnosis not present

## 2015-07-21 DIAGNOSIS — I10 Essential (primary) hypertension: Secondary | ICD-10-CM | POA: Diagnosis not present

## 2015-07-21 DIAGNOSIS — I2510 Atherosclerotic heart disease of native coronary artery without angina pectoris: Secondary | ICD-10-CM | POA: Diagnosis not present

## 2015-08-31 DIAGNOSIS — I10 Essential (primary) hypertension: Secondary | ICD-10-CM | POA: Diagnosis not present

## 2015-08-31 DIAGNOSIS — I2510 Atherosclerotic heart disease of native coronary artery without angina pectoris: Secondary | ICD-10-CM | POA: Diagnosis not present

## 2015-10-19 DIAGNOSIS — I10 Essential (primary) hypertension: Secondary | ICD-10-CM | POA: Diagnosis not present

## 2015-10-19 DIAGNOSIS — I2510 Atherosclerotic heart disease of native coronary artery without angina pectoris: Secondary | ICD-10-CM | POA: Diagnosis not present

## 2015-10-29 DIAGNOSIS — I10 Essential (primary) hypertension: Secondary | ICD-10-CM | POA: Diagnosis not present

## 2015-10-29 DIAGNOSIS — E039 Hypothyroidism, unspecified: Secondary | ICD-10-CM | POA: Diagnosis not present

## 2015-10-29 DIAGNOSIS — N400 Enlarged prostate without lower urinary tract symptoms: Secondary | ICD-10-CM | POA: Diagnosis not present

## 2015-10-29 DIAGNOSIS — I2510 Atherosclerotic heart disease of native coronary artery without angina pectoris: Secondary | ICD-10-CM | POA: Diagnosis not present

## 2015-11-29 DIAGNOSIS — I2510 Atherosclerotic heart disease of native coronary artery without angina pectoris: Secondary | ICD-10-CM | POA: Diagnosis not present

## 2015-11-29 DIAGNOSIS — I10 Essential (primary) hypertension: Secondary | ICD-10-CM | POA: Diagnosis not present

## 2015-12-18 ENCOUNTER — Other Ambulatory Visit: Payer: Self-pay | Admitting: Urology

## 2015-12-18 DIAGNOSIS — R351 Nocturia: Secondary | ICD-10-CM | POA: Diagnosis not present

## 2015-12-18 DIAGNOSIS — N5201 Erectile dysfunction due to arterial insufficiency: Secondary | ICD-10-CM | POA: Diagnosis not present

## 2015-12-18 DIAGNOSIS — N320 Bladder-neck obstruction: Secondary | ICD-10-CM | POA: Diagnosis not present

## 2015-12-18 DIAGNOSIS — N401 Enlarged prostate with lower urinary tract symptoms: Secondary | ICD-10-CM | POA: Diagnosis not present

## 2015-12-20 ENCOUNTER — Encounter (HOSPITAL_BASED_OUTPATIENT_CLINIC_OR_DEPARTMENT_OTHER): Payer: Self-pay | Admitting: *Deleted

## 2015-12-21 ENCOUNTER — Encounter (HOSPITAL_BASED_OUTPATIENT_CLINIC_OR_DEPARTMENT_OTHER): Payer: Self-pay | Admitting: *Deleted

## 2015-12-30 DIAGNOSIS — I10 Essential (primary) hypertension: Secondary | ICD-10-CM | POA: Diagnosis not present

## 2015-12-30 DIAGNOSIS — I2510 Atherosclerotic heart disease of native coronary artery without angina pectoris: Secondary | ICD-10-CM | POA: Diagnosis not present

## 2016-01-15 ENCOUNTER — Encounter (HOSPITAL_BASED_OUTPATIENT_CLINIC_OR_DEPARTMENT_OTHER): Payer: Self-pay | Admitting: *Deleted

## 2016-01-15 NOTE — Progress Notes (Signed)
NPO AFTER MN.  ARRIVE AT 0900.  NEEDS ISTAT 8.  CURRENT EKG IN CHART AND EPIC.  WILL TAKE AM MEDS DOS W/ SIPS OF WATER.

## 2016-01-16 ENCOUNTER — Encounter (HOSPITAL_BASED_OUTPATIENT_CLINIC_OR_DEPARTMENT_OTHER)
Admission: RE | Disposition: A | Discharge status: Home / Self Care | Payer: Self-pay | Source: Ambulatory Visit | Attending: Urology

## 2016-01-16 ENCOUNTER — Ambulatory Visit (HOSPITAL_BASED_OUTPATIENT_CLINIC_OR_DEPARTMENT_OTHER)
Admission: RE | Admit: 2016-01-16 | Discharge: 2016-01-16 | Disposition: A | Discharge status: Home / Self Care | Payer: Commercial Managed Care - HMO | Source: Ambulatory Visit | Attending: Urology | Admitting: Urology

## 2016-01-16 ENCOUNTER — Encounter (HOSPITAL_BASED_OUTPATIENT_CLINIC_OR_DEPARTMENT_OTHER): Payer: Self-pay | Admitting: *Deleted

## 2016-01-16 ENCOUNTER — Ambulatory Visit (HOSPITAL_BASED_OUTPATIENT_CLINIC_OR_DEPARTMENT_OTHER): Payer: Commercial Managed Care - HMO | Admitting: Anesthesiology

## 2016-01-16 DIAGNOSIS — N359 Urethral stricture, unspecified: Secondary | ICD-10-CM | POA: Diagnosis not present

## 2016-01-16 DIAGNOSIS — I2510 Atherosclerotic heart disease of native coronary artery without angina pectoris: Secondary | ICD-10-CM | POA: Insufficient documentation

## 2016-01-16 DIAGNOSIS — K219 Gastro-esophageal reflux disease without esophagitis: Secondary | ICD-10-CM | POA: Diagnosis not present

## 2016-01-16 DIAGNOSIS — E785 Hyperlipidemia, unspecified: Secondary | ICD-10-CM | POA: Diagnosis not present

## 2016-01-16 DIAGNOSIS — N138 Other obstructive and reflux uropathy: Secondary | ICD-10-CM | POA: Diagnosis not present

## 2016-01-16 DIAGNOSIS — N401 Enlarged prostate with lower urinary tract symptoms: Secondary | ICD-10-CM | POA: Insufficient documentation

## 2016-01-16 DIAGNOSIS — Z7982 Long term (current) use of aspirin: Secondary | ICD-10-CM | POA: Insufficient documentation

## 2016-01-16 DIAGNOSIS — I252 Old myocardial infarction: Secondary | ICD-10-CM | POA: Diagnosis not present

## 2016-01-16 DIAGNOSIS — Z79899 Other long term (current) drug therapy: Secondary | ICD-10-CM | POA: Insufficient documentation

## 2016-01-16 DIAGNOSIS — I10 Essential (primary) hypertension: Secondary | ICD-10-CM | POA: Diagnosis not present

## 2016-01-16 DIAGNOSIS — I129 Hypertensive chronic kidney disease with stage 1 through stage 4 chronic kidney disease, or unspecified chronic kidney disease: Secondary | ICD-10-CM | POA: Diagnosis not present

## 2016-01-16 DIAGNOSIS — Z87891 Personal history of nicotine dependence: Secondary | ICD-10-CM | POA: Insufficient documentation

## 2016-01-16 DIAGNOSIS — N358 Other urethral stricture: Secondary | ICD-10-CM | POA: Diagnosis not present

## 2016-01-16 DIAGNOSIS — Z951 Presence of aortocoronary bypass graft: Secondary | ICD-10-CM | POA: Diagnosis not present

## 2016-01-16 DIAGNOSIS — E039 Hypothyroidism, unspecified: Secondary | ICD-10-CM | POA: Diagnosis not present

## 2016-01-16 HISTORY — DX: Old myocardial infarction: I25.2

## 2016-01-16 HISTORY — DX: Unspecified symptoms and signs involving the genitourinary system: R39.9

## 2016-01-16 HISTORY — DX: Gastro-esophageal reflux disease without esophagitis: K21.9

## 2016-01-16 HISTORY — DX: Unspecified right bundle-branch block: I45.10

## 2016-01-16 HISTORY — DX: Personal history of other diseases of the circulatory system: Z86.79

## 2016-01-16 HISTORY — DX: Reserved for inherently not codable concepts without codable children: IMO0001

## 2016-01-16 HISTORY — PX: CYSTOSCOPY WITH URETHRAL DILATATION: SHX5125

## 2016-01-16 HISTORY — DX: Benign prostatic hyperplasia without lower urinary tract symptoms: N40.0

## 2016-01-16 HISTORY — DX: Presence of aortocoronary bypass graft: Z95.1

## 2016-01-16 HISTORY — DX: Presence of dental prosthetic device (complete) (partial): Z97.2

## 2016-01-16 HISTORY — PX: CYSTOSCOPY: SHX5120

## 2016-01-16 HISTORY — DX: Presence of spectacles and contact lenses: Z97.3

## 2016-01-16 HISTORY — DX: Unspecified urethral stricture, male, unspecified site: N35.919

## 2016-01-16 HISTORY — DX: Chronic ischemic heart disease, unspecified: I25.9

## 2016-01-16 LAB — POCT I-STAT, CHEM 8
BUN: 15 mg/dL (ref 6–20)
Calcium, Ion: 1.21 mmol/L (ref 1.15–1.40)
Chloride: 103 mmol/L (ref 101–111)
Creatinine, Ser: 1.2 mg/dL (ref 0.61–1.24)
Glucose, Bld: 100 mg/dL — ABNORMAL HIGH (ref 65–99)
HCT: 46 % (ref 39.0–52.0)
Hemoglobin: 15.6 g/dL (ref 13.0–17.0)
POTASSIUM: 4 mmol/L (ref 3.5–5.1)
Sodium: 141 mmol/L (ref 135–145)
TCO2: 25 mmol/L (ref 0–100)

## 2016-01-16 SURGERY — CYSTOSCOPY, WITH URETHRAL DILATION
Anesthesia: General | Site: Renal

## 2016-01-16 MED ORDER — LIDOCAINE 2% (20 MG/ML) 5 ML SYRINGE
INTRAMUSCULAR | Status: DC | PRN
  Administered 2016-01-16: 80 mg via INTRAVENOUS

## 2016-01-16 MED ORDER — GENTAMICIN SULFATE 40 MG/ML IJ SOLN
5.0000 mg/kg | INTRAMUSCULAR | Status: AC
  Administered 2016-01-16: 400 mg via INTRAVENOUS
  Filled 2016-01-16: qty 10

## 2016-01-16 MED ORDER — PROPOFOL 10 MG/ML IV BOLUS
INTRAVENOUS | Status: DC | PRN
  Administered 2016-01-16: 30 mg via INTRAVENOUS
  Administered 2016-01-16: 120 mg via INTRAVENOUS

## 2016-01-16 MED ORDER — TRAMADOL HCL 50 MG PO TABS
ORAL_TABLET | ORAL | Status: AC
  Filled 2016-01-16: qty 1

## 2016-01-16 MED ORDER — GENTAMICIN IN SALINE 1.6-0.9 MG/ML-% IV SOLN
80.0000 mg | INTRAVENOUS | Status: DC
  Filled 2016-01-16: qty 50

## 2016-01-16 MED ORDER — FENTANYL CITRATE (PF) 100 MCG/2ML IJ SOLN
25.0000 ug | INTRAMUSCULAR | Status: DC | PRN
  Filled 2016-01-16: qty 1

## 2016-01-16 MED ORDER — TRAMADOL HCL 50 MG PO TABS
50.0000 mg | ORAL_TABLET | Freq: Four times a day (QID) | ORAL | Status: DC | PRN
  Administered 2016-01-16: 50 mg via ORAL
  Filled 2016-01-16: qty 1

## 2016-01-16 MED ORDER — PROPOFOL 10 MG/ML IV BOLUS
INTRAVENOUS | Status: AC
  Filled 2016-01-16: qty 20

## 2016-01-16 MED ORDER — FENTANYL CITRATE (PF) 100 MCG/2ML IJ SOLN
INTRAMUSCULAR | Status: DC | PRN
  Administered 2016-01-16: 50 ug via INTRAVENOUS

## 2016-01-16 MED ORDER — ONDANSETRON HCL 4 MG/2ML IJ SOLN
INTRAMUSCULAR | Status: AC
  Filled 2016-01-16: qty 2

## 2016-01-16 MED ORDER — DEXAMETHASONE SODIUM PHOSPHATE 4 MG/ML IJ SOLN
INTRAMUSCULAR | Status: DC | PRN
  Administered 2016-01-16: 10 mg via INTRAVENOUS

## 2016-01-16 MED ORDER — TRAMADOL HCL 50 MG PO TABS: 50.0000 mg | ORAL_TABLET | Freq: Four times a day (QID) | ORAL | 0 refills | Status: DC | PRN

## 2016-01-16 MED ORDER — LACTATED RINGERS IV SOLN
INTRAVENOUS | Status: DC
  Administered 2016-01-16: 09:00:00 via INTRAVENOUS
  Filled 2016-01-16: qty 1000

## 2016-01-16 MED ORDER — IOHEXOL 300 MG/ML  SOLN
INTRAMUSCULAR | Status: DC | PRN
  Administered 2016-01-16: 10 mL

## 2016-01-16 MED ORDER — FENTANYL CITRATE (PF) 100 MCG/2ML IJ SOLN
INTRAMUSCULAR | Status: AC
  Filled 2016-01-16: qty 2

## 2016-01-16 MED ORDER — LIDOCAINE 2% (20 MG/ML) 5 ML SYRINGE
INTRAMUSCULAR | Status: AC
  Filled 2016-01-16: qty 5

## 2016-01-16 MED ORDER — DEXAMETHASONE SODIUM PHOSPHATE 10 MG/ML IJ SOLN
INTRAMUSCULAR | Status: AC
  Filled 2016-01-16: qty 1

## 2016-01-16 MED ORDER — CEPHALEXIN 500 MG PO CAPS: 500.0000 mg | ORAL_CAPSULE | Freq: Two times a day (BID) | ORAL | 0 refills | Status: AC

## 2016-01-16 MED ORDER — STERILE WATER FOR IRRIGATION IR SOLN
Status: DC | PRN
  Administered 2016-01-16: 3000 mL

## 2016-01-16 SURGICAL SUPPLY — 34 items
BAG DRAIN URO-CYSTO SKYTR STRL (DRAIN) ×3 IMPLANT
BAG URINE DRAINAGE (UROLOGICAL SUPPLIES) ×3 IMPLANT
BAG URINE LEG 19OZ MD ST LTX (BAG) IMPLANT
BALLN NEPHROSTOMY (BALLOONS) ×3
BALLOON NEPHROSTOMY (BALLOONS) ×1 IMPLANT
CATH FOLEY 2W COUNCIL 20FR 5CC (CATHETERS) ×3 IMPLANT
CATH FOLEY 2W COUNCIL 5CC 16FR (CATHETERS) IMPLANT
CATH FOLEY 2W COUNCIL 5CC 18FR (CATHETERS) IMPLANT
CATH FOLEY 2WAY  3CC 10FR (CATHETERS)
CATH FOLEY 2WAY 3CC 10FR (CATHETERS) IMPLANT
CATH ROBINSON RED A/P 14FR (CATHETERS) IMPLANT
CLOTH BEACON ORANGE TIMEOUT ST (SAFETY) ×3 IMPLANT
ELECT REM PT RETURN 9FT ADLT (ELECTROSURGICAL) ×3
ELECTRODE REM PT RTRN 9FT ADLT (ELECTROSURGICAL) ×1 IMPLANT
GLOVE BIO SURGEON STRL SZ 6.5 (GLOVE) ×2 IMPLANT
GLOVE BIO SURGEON STRL SZ7.5 (GLOVE) ×6 IMPLANT
GLOVE BIO SURGEONS STRL SZ 6.5 (GLOVE) ×1
GLOVE BIOGEL PI IND STRL 6.5 (GLOVE) ×1 IMPLANT
GLOVE BIOGEL PI IND STRL 7.5 (GLOVE) ×2 IMPLANT
GLOVE BIOGEL PI INDICATOR 6.5 (GLOVE) ×2
GLOVE BIOGEL PI INDICATOR 7.5 (GLOVE) ×4
GOWN STRL REUS W/ TWL LRG LVL3 (GOWN DISPOSABLE) ×3 IMPLANT
GOWN STRL REUS W/TWL LRG LVL3 (GOWN DISPOSABLE) ×6
GUIDEWIRE ANG ZIPWIRE 038X150 (WIRE) ×3 IMPLANT
GUIDEWIRE STR DUAL SENSOR (WIRE) IMPLANT
HOLDER FOLEY CATH W/STRAP (MISCELLANEOUS) ×3 IMPLANT
KIT ROOM TURNOVER WOR (KITS) ×3 IMPLANT
MANIFOLD NEPTUNE II (INSTRUMENTS) ×3 IMPLANT
NEEDLE HYPO 18GX1.5 BLUNT FILL (NEEDLE) IMPLANT
PACK CYSTO (CUSTOM PROCEDURE TRAY) ×3 IMPLANT
SYR 20CC LL (SYRINGE) IMPLANT
TUBE CONNECTING 12'X1/4 (SUCTIONS) ×1
TUBE CONNECTING 12X1/4 (SUCTIONS) ×2 IMPLANT
WATER STERILE IRR 3000ML UROMA (IV SOLUTION) ×3 IMPLANT

## 2016-01-16 NOTE — Brief Op Note (Signed)
01/16/2016  11:12 AM  PATIENT:  Clayton Austin  75 y.o. male  PRE-OPERATIVE DIAGNOSIS:  RECURRENT URETHRAL STRICTURE  POST-OPERATIVE DIAGNOSIS:  RECURRENT URETHRAL STRICTURE  PROCEDURE:  Procedure(s): CYSTOSCOPY WITH URETHRAL BALLOON DILATATION (N/A) CYSTOSCOPY FLEXIBLE (N/A)  SURGEON:  Surgeon(s) and Role:    * Sebastian Acheheodore Vashon Riordan, MD - Primary  PHYSICIAN ASSISTANT:   ASSISTANTS: none   ANESTHESIA:   general  EBL:  Total I/O In: 300 [I.V.:300] Out: -   BLOOD ADMINISTERED:none  DRAINS: 76F council foley to gravity 10cc water in balloon   LOCAL MEDICATIONS USED:  NONE  SPECIMEN:  No Specimen  DISPOSITION OF SPECIMEN:  N/A  COUNTS:  YES  TOURNIQUET:  * No tourniquets in log *  DICTATION: .Other Dictation: Dictation Number [id number]  PLAN OF CARE: Discharge to home after PACU  PATIENT DISPOSITION:  PACU - hemodynamically stable.   Delay start of Pharmacological VTE agent (>24hrs) due to surgical blood loss or risk of bleeding: yes

## 2016-01-16 NOTE — Transfer of Care (Signed)
  Vitals:   01/16/16 0840 01/16/16 1124  BP: (!) 165/80 (!) (P) 146/78  Pulse: 83   Resp: 16   Temp: 36.4 C (P) 36.6 C    Last Pain:  Vitals:   01/16/16 0840  TempSrc: Oral      Patients Stated Pain Goal: 3 (01/16/16 0946)  Immediate Anesthesia Transfer of Care Note  Patient: Clayton Austin  Procedure(s) Performed: Procedure(s) (LRB): CYSTOSCOPY WITH URETHRAL BALLOON DILATATION (N/A) CYSTOSCOPY FLEXIBLE (N/A)  Patient Location: PACU  Anesthesia Type: General  Level of Consciousness: awake, alert  and oriented  Airway & Oxygen Therapy: Patient Spontanous Breathing and Patient connected to face mask oxygen  Post-op Assessment: Report given to PACU RN and Post -op Vital signs reviewed and stable  Post vital signs: Reviewed and stable  Complications: No apparent anesthesia complications

## 2016-01-16 NOTE — Anesthesia Preprocedure Evaluation (Addendum)
Anesthesia Evaluation  Patient identified by MRN, date of birth, ID band Patient awake    Reviewed: Allergy & Precautions, NPO status , Patient's Chart, lab work & pertinent test results  Airway Mallampati: II  TM Distance: >3 FB Neck ROM: Full    Dental  (+) Partial Upper,    Pulmonary former smoker,    Pulmonary exam normal breath sounds clear to auscultation       Cardiovascular hypertension, + CAD and + Past MI  Normal cardiovascular exam+ dysrhythmias  Rhythm:Regular Rate:Normal     Neuro/Psych negative neurological ROS  negative psych ROS   GI/Hepatic Neg liver ROS, GERD  Medicated,  Endo/Other  Hypothyroidism   Renal/GU Renal disease  negative genitourinary   Musculoskeletal negative musculoskeletal ROS (+)   Abdominal   Peds negative pediatric ROS (+)  Hematology  (+) anemia ,   Anesthesia Other Findings   Reproductive/Obstetrics negative OB ROS                            Anesthesia Physical Anesthesia Plan  ASA: III  Anesthesia Plan: General   Post-op Pain Management:    Induction: Intravenous  Airway Management Planned: LMA  Additional Equipment:   Intra-op Plan:   Post-operative Plan: Extubation in OR  Informed Consent: I have reviewed the patients History and Physical, chart, labs and discussed the procedure including the risks, benefits and alternatives for the proposed anesthesia with the patient or authorized representative who has indicated his/her understanding and acceptance.   Dental advisory given  Plan Discussed with: CRNA  Anesthesia Plan Comments:         Anesthesia Quick Evaluation

## 2016-01-16 NOTE — Discharge Instructions (Signed)
Post Anesthesia Home Care Instructions  Activity: Get plenty of rest for the remainder of the day. A responsible adult should stay with you for 24 hours following the procedure.  For the next 24 hours, DO NOT: -Drive a car -Advertising copywriter -Drink alcoholic beverages -Take any medication unless instructed by your physician -Make any legal decisions or sign important papers.  Meals: Start with liquid foods such as gelatin or soup. Progress to regular foods as tolerated. Avoid greasy, spicy, heavy foods. If nausea and/or vomiting occur, drink only clear liquids until the nausea and/or vomiting subsides. Call your physician if vomiting continues.  Special Instructions/Symptoms: Your throat may feel dry or sore from the anesthesia or the breathing tube placed in your throat during surgery. If this causes discomfort, gargle with warm salt water. The discomfort should disappear within 24 hours.  If you had a scopolamine patch placed behind your ear for the management of post- operative nausea and/or vomiting:  1. The medication in the patch is effective for 72 hours, after which it should be removed.  Wrap patch in a tissue and discard in the trash. Wash hands thoroughly with soap and water. 2. You may remove the patch earlier than 72 hours if you experience unpleasant side effects which may include dry mouth, dizziness or visual disturbances. 3. Avoid touching the patch. Wash your hands with soap and water after contact with the patch.    Foley Catheter Care, Adult A Foley catheter is a soft, flexible tube. This tube is placed into your bladder to drain pee (urine). If you go home with this catheter in place, follow the instructions below. TAKING CARE OF THE CATHETER 1. Wash your hands with soap and water. 2. Put soap and water on a clean washcloth.  Clean the skin where the tube goes into your body.  Clean away from the tube site.  Never wipe toward the tube.  Clean the area using  a circular motion.  Remove all the soap. Pat the area dry with a clean towel. For males, reposition the skin that covers the end of the penis (foreskin). 3. Attach the tube to your leg with tape or a leg strap. Do not stretch the tube tight. If you are using tape, remove any stickiness left behind by past tape you used. 4. Keep the drainage bag below your hips. Keep it off the floor. 5. Check your tube during the day. Make sure it is working and draining. Make sure the tube does not curl, twist, or bend. 6. Do not pull on the tube or try to take it out. TAKING CARE OF THE DRAINAGE BAGS You will have a large overnight drainage bag and a small leg bag. You may wear the overnight bag any time. Never wear the small bag at night. Follow the directions below. Emptying the Drainage Bag Empty your drainage bag when it is  - full or at least 2-3 times a day. 1. Wash your hands with soap and water. 2. Keep the drainage bag below your hips. 3. Hold the dirty bag over the toilet or clean container. 4. Open the pour spout at the bottom of the bag. Empty the pee into the toilet or container. Do not let the pour spout touch anything. 5. Clean the pour spout with a gauze pad or cotton ball that has rubbing alcohol on it. 6. Close the pour spout. 7. Attach the bag to your leg with tape or a leg strap. 8. Wash your hands well.  Changing the Drainage Bag Change your bag once a month or sooner if it starts to smell or look dirty.  1. Wash your hands with soap and water. 2. Pinch the rubber tube so that pee does not spill out. 3. Disconnect the catheter tube from the drainage tube at the connection valve. Do not let the tubes touch anything. 4. Clean the end of the catheter tube with an alcohol wipe. Clean the end of a the drainage tube with a different alcohol wipe. 5. Connect the catheter tube to the drainage tube of the clean drainage bag. 6. Attach the new bag to the leg with tape or a leg strap. Avoid  attaching the new bag too tightly. 7. Wash your hands well. Cleaning the Drainage Bag 1. Wash your hands with soap and water. 2. Wash the bag in warm, soapy water. 3. Rinse the bag with warm water. 4. Fill the bag with a mixture of white vinegar and water (1 cup vinegar to 1 quart warm water [.2 liter vinegar to 1 liter warm water]). Close the bag and soak it for 30 minutes in the solution. 5. Rinse the bag with warm water. 6. Hang the bag to dry with the pour spout open and hanging downward. 7. Store the clean bag (once it is dry) in a clean plastic bag. 8. Wash your hands well. PREVENT INFECTION  Wash your hands before and after touching your tube.  Take showers every day. Wash the skin where the tube enters your body. Do not take baths. Replace wet leg straps with dry ones, if this applies.  Do not use powders, sprays, or lotions on the genital area. Only use creams, lotions, or ointments as told by your doctor.  For females, wipe from front to back after going to the bathroom.  Drink enough fluids to keep your pee clear or pale yellow unless you are told not to have too much fluid (fluid restriction).  Do not let the drainage bag or tubing touch or lie on the floor.  Wear cotton underwear to keep the area dry. GET HELP IF:  Your pee is cloudy or smells unusually bad.  Your tube becomes clogged.  You are not draining pee into the bag or your bladder feels full.  Your tube starts to leak. GET HELP RIGHT AWAY IF:  You have pain, puffiness (swelling), redness, or yellowish-white fluid (pus) where the tube enters the body.  You have pain in the belly (abdomen), legs, lower back, or bladder.  You have a fever.  You see blood fill the tube, or your pee is pink or red.  You feel sick to your stomach (nauseous), throw up (vomit), or have chills.  Your tube gets pulled out. MAKE SURE YOU:   Understand these instructions.  Will watch your condition.  Will get help right  away if you are not doing well or get worse.   This information is not intended to replace advice given to you by your health care provider. Make sure you discuss any questions you have with your health care provider.   Document Released: 08/02/2012 Document Revised: 04/28/2014 Document Reviewed: 08/02/2012 Elsevier Interactive Patient Education 2016 [employer]   CYSTOSCOPY HOME CARE INSTRUCTIONS  Activity: Rest for the remainder of the day.  Do not drive or operate equipment today.  You may resume normal activities in one to two days as instructed by your physician.   Meals: Drink plenty of liquids and eat light foods such as gelatin  or soup this evening.  You may return to a normal meal plan tomorrow.  Return to Work: You may return to work in one to two days or as instructed by your physician.  Special Instructions / Symptoms: Call your physician if any of these symptoms occur:   -persistent or heavy bleeding  -bleeding which continues after first few urination  -large blood clots that are difficult to pass  -urine stream diminishes or stops completely  -fever equal to or higher than 101 degrees Farenheit.  -cloudy urine with a strong, foul odor  -severe pain  Females should always wipe from front to back after elimination.  You may feel some burning pain when you urinate.  This should disappear with time.  Applying moist heat to the lower abdomen or a hot tub bath may help relieve the pain. \  Follow-Up / Date of Return Visit to Your Physician:  Call for an appointment to arrange follow-up.  Patient Signature:  ________________________________________________________  Nurse's Signature:  ________________________________________________________  1 - You may have urinary urgency (bladder spasms) and bloody urine on / off with catheter in place. This is normal.  2 - Call MD or go to ER for fever >102, severe pain / nausea / vomiting not relieved by medications, or acute  change in medical status

## 2016-01-16 NOTE — Anesthesia Procedure Notes (Signed)
Procedure Name: LMA Insertion Date/Time: 01/16/2016 10:53 AM Performed by: Sherrian DiversENENNY, BRUCE Pre-anesthesia Checklist: Patient identified, Emergency Drugs available, Suction available and Patient being monitored Patient Re-evaluated:Patient Re-evaluated prior to inductionOxygen Delivery Method: Circle system utilized Preoxygenation: Pre-oxygenation with 100% oxygen Intubation Type: IV induction Ventilation: Mask ventilation without difficulty LMA: LMA inserted LMA Size: 4.0 Number of attempts: 1 Airway Equipment and Method: Bite block Placement Confirmation: positive ETCO2 Tube secured with: Tape Dental Injury: Teeth and Oropharynx as per pre-operative assessment  Comments: Lower front left tooth noted to be significantly loose before LMA insertion.  Tooth remains intact after insertion.

## 2016-01-16 NOTE — Anesthesia Postprocedure Evaluation (Signed)
Anesthesia Post Note  Patient: Clayton Austin  Procedure(s) Performed: Procedure(s) (LRB): CYSTOSCOPY WITH URETHRAL BALLOON DILATATION (N/A) CYSTOSCOPY FLEXIBLE (N/A)  Patient location during evaluation: PACU Anesthesia Type: General Level of consciousness: awake and alert Pain management: pain level controlled Vital Signs Assessment: post-procedure vital signs reviewed and stable Respiratory status: spontaneous breathing, nonlabored ventilation, respiratory function stable and patient connected to nasal cannula oxygen Cardiovascular status: blood pressure returned to baseline and stable Postop Assessment: no signs of nausea or vomiting Anesthetic complications: no    Last Vitals:  Vitals:   01/16/16 1145 01/16/16 1200  BP: (!) 141/82 (!) 147/89  Pulse: 67 64  Resp: 15 15  Temp:      Last Pain:  Vitals:   01/16/16 1200  TempSrc:   PainSc: 4                  Sekou Zuckerman J

## 2016-01-16 NOTE — H&P (Signed)
Clayton QuillJames W Austin is an 75 y.o. male.    Chief Complaint: Pre-op Cysto-Urethral Dilation  HPI:   1 - Prostatic Hypertrophy - s/p TURP in [place] around 2014 per report.    2 - Proximal Urethral Stricture - s/p emergent endoscopic dilation at time of CABG 2014. Recurrence of high grade multifocal narrowing by office cysto 11/2015.    3 - Lower Urinary Tract Symptoms / Nocturia - Progressive bother from nocturia up to 6-8. On tamsulosin at baseline. PVR 02/2015 "3mL". Has h/o BPH and stricture.    PMH sig for CAD/CABG (not limiting), HTN. No blood thinners. His PCP is Glenice Laineesafaye Fanta MD.    Today " Clayton Austin " is seen to proceed with cysto and urethral dilation for his recurrent and increasingly symptomatic stricture. Most recent UA withtou infectious parameters. No interval fevers.    Past Medical History:  Diagnosis Date  . Benign prostatic hypertrophy   . BPH (benign prostatic hypertrophy)   . Coronary artery disease cardiologist-  dr Purvis Sheffieldkoneswaran   a. NSTEMI 07/2012 => critical LM disease on LHC => s/p CABG (L-LAD/D1, S-OM2, S-PDA/PL);  [facility]  . Exercise tolerance test normal    Dec 2014  per dr Purvis Sheffieldkoneswaran (cardiologist) note [facility] treadmilll score 5.5 (low risk)  . GERD (gastroesophageal reflux disease)   . History of atrial fibrillation    post-op  CABG 04/ 2014  . History of non-ST elevation myocardial infarction (NSTEMI)    04/ 2014  . Hyperlipidemia   . Hypertension   . Hypothyroidism   . Ischemic heart disease    STABLE PER DR Purvis SheffieldKONESWARAN (CARDIOLOGIST) NOTE 06-18-2015  . Lower urinary tract symptoms (LUTS)   . RBBB (right bundle branch block)   . S/P CABG x 5    08-17-2012   LIMA sequentilly to LAD and Diagonal,  SVG to OM,  SVG seq to PDA and PLB with EVH from right thigh  . Urethral stricture    recurrent  . Wears dentures    UPPER  . Wears glasses     Past Surgical History:  Procedure Laterality Date  . CORONARY ARTERY BYPASS GRAFT N/A  08/17/2012   Procedure: CORONARY ARTERY BYPASS GRAFTING (CABG);  Surgeon: Loreli SlotSteven C Hendrickson, MD;  [facility];  Service: Open Heart Surgery;  Laterality: N/A;  L-LAD/D1, S-OM2,  S-PDA/PL  . CYSTOSCOPY N/A 08/17/2012   Procedure: CYSTOSCOPY FLEXIBLE for difficult  foley catheter insertion;  Surgeon: Sebastian Acheheodore Abdulloh Ullom, MD;  [facility];  Service: Urology;  Laterality: N/A;  . LEFT HEART CATHETERIZATION WITH CORONARY ANGIOGRAM N/A 08/16/2012   Procedure: LEFT HEART CATHETERIZATION WITH CORONARY ANGIOGRAM;  Surgeon: Tonny BollmanMichael Cooper, MD;  [facility];  Service: Cardiovascular;  Laterality: N/A;  NSTEMI;  critical LM stenosis extending into the ostial LAD & LCFX,  moderately tight RCA stenosis,  Normal LVF, ef 55-60%  . TRANSURETHRAL RESECTION OF PROSTATE  06/2012    Family History  Problem Relation Age of Onset  . Coronary artery disease Father [id number]   Social History:  reports that he quit smoking about 45 years ago. His smoking use included Cigarettes. He started smoking about 55 years ago. He has a 5.00 pack-year smoking history. He has never used smokeless tobacco. He reports that he does not drink alcohol or use drugs.  Allergies: No Known Allergies  No prescriptions prior to admission.    No results found for this or any previous visit (from the past 48 hour(s)). No results found.  Review of Systems  Constitutional: Negative.  Negative for chills and fever.  HENT: Negative.   Eyes: Negative.   Respiratory: Negative.   Cardiovascular: Negative.   Gastrointestinal: Negative.  Negative for heartburn.  Genitourinary: Positive for frequency and urgency. Negative for flank pain and hematuria.  Musculoskeletal: Negative.   Skin: Negative.   Neurological: Negative.   Endo/Heme/Allergies: Negative.   Psychiatric/Behavioral: Negative.     Height 5\' 11"  (1.803 m), weight 86.6 kg (191 lb). Physical Exam  Constitutional: He is oriented to person, place, and time. He appears  well-developed.  Eyes: Pupils are equal, round, and reactive to light.  Neck: Normal range of motion.  Cardiovascular: Normal rate.   Respiratory: Effort normal.  GI: Soft.  Genitourinary: Penis normal.  Musculoskeletal: Normal range of motion.  Neurological: He is alert and oriented to person, place, and time.  Skin: Skin is warm.  Psychiatric: He has a normal mood and affect. His behavior is normal. Judgment and thought content normal.     Assessment/Plan  Proceed as planned with cysto and urethral dilation under anesthesia. Risks, benefits, alternatives, expected peri=op course, need for temporary post-op foley discussed previously and reiterated today.    Sebastian Ache, MD 01/16/2016, 6:52 AM

## 2016-01-17 ENCOUNTER — Encounter (HOSPITAL_BASED_OUTPATIENT_CLINIC_OR_DEPARTMENT_OTHER): Payer: Self-pay | Admitting: Urology

## 2016-01-17 NOTE — Op Note (Signed)
NAME:  Clayton Austin, Clayton Austin              ACCOUNT NO.:  [id number]  MEDICAL RECORD NO.:  [id number]  LOCATION:                                 FACILITY:  PHYSICIAN:  Sebastian Acheheodore Cem Kosman, MD     DATE OF BIRTH:  11/09/1940  DATE OF PROCEDURE: 01/16/2016                              OPERATIVE REPORT   DIAGNOSIS:  High-grade urethral stricture with obstructive urinary symptoms.  PROCEDURES: 1. Cystoscopy with balloon dilation of urethral stricture. 2. Catheter placement, complicated.  ESTIMATED BLOOD LOSS:  Nil.  COMPLICATIONS:  None.  SPECIMENS:  None.  FINDINGS: 1. High-grade distal bulbar stricture approximately 8-French     predilation, 24-French postdilation. 2. Otherwise unremarkable urethra. 3. Trabeculated urinary bladder. 4. Successful placement of catheter 20-French Council 10 mL sterile     water in the balloon.  INDICATIONS:  [name] is a very pleasant 75 year old gentleman with history of lower tract GU obstruction.  He is status post a transurethral resection many years ago.  He also has known slowly recurrent bulbar stricture disease.  He had significant progressive worsening obstructive symptoms and office cystoscopy revealed recurrence of high-grade distal bulbar stricture that was not amenable to office management.  Options were discussed including operative management with cystoscopy and balloon dilation.  He wished to proceed.  Informed consent was obtained and placed in the medical record.  PROCEDURE IN DETAIL:  The patient being Clayton Austin verified. Procedure being cystourethral dilation was confirmed.  Procedure was carried out.  Time-out was performed.  Intravenous antibiotics were administered.  General LMA anesthesia introduced.  The patient placed into a low lithotomy position.  Sterile field was created by prepping and draping the patient's penis, perineum, proximal thighs using iodine. Cystourethroscopy was performed using a 21-French rigid  cystoscope with offset lens.  Inspection of the distal pendulous urethra was unremarkable.  Inspection of the more proximal pendulous urethra and distal bulbar revealed high-grade apparent short-segment stricture. This was estimated to be 6 to 8-French and appeared too dense for any sort of simple sounding, as such a Sensor wire was advanced across this into the level of urinary bladder as corroborated by spot fluoroscopy and the 24-French balloon dilation catheter was advanced across this using combination of fluoroscopic and cystoscopic guidance.  This was inflated to a pressure of 90 atmospheres, held for 3 minutes, then removed.  Next, the 16-French flexible cystoscope was used to inspect the area once again.  There was excellent urethral caliber now of the area of stricture.  It did appear that the stricture segment was probably 5 mm in depth.  The prostatic fossa was wide open.  Inspection of the urinary bladder revealed a moderately trabeculated bladder. Singleton ureteral orifices.  No papillary lesions or calcifications. Next, a 20-French Council-type catheter was placed over the Sensor working wire to the level of the urinary bladder, 10 mL of sterile water placed in the balloon, this was noted to straight drain.  The efflux was clear.  Foreskin was reduced and procedure terminated.  The patient tolerated the procedure well.  There were no immediate periprocedural complications.  The patient was taken to the postanesthesia care unit in a stable condition.  ______________________________ Sebastian Ache, MD   ______________________________ Sebastian Ache, MD    TM/MEDQ  D:  01/16/2016  T:  01/17/2016  Job:  [id number]

## 2016-01-22 DIAGNOSIS — N5201 Erectile dysfunction due to arterial insufficiency: Secondary | ICD-10-CM | POA: Diagnosis not present

## 2016-01-22 DIAGNOSIS — R351 Nocturia: Secondary | ICD-10-CM | POA: Diagnosis not present

## 2016-01-22 DIAGNOSIS — N401 Enlarged prostate with lower urinary tract symptoms: Secondary | ICD-10-CM | POA: Diagnosis not present

## 2016-01-22 DIAGNOSIS — N320 Bladder-neck obstruction: Secondary | ICD-10-CM | POA: Diagnosis not present

## 2016-01-29 DIAGNOSIS — I10 Essential (primary) hypertension: Secondary | ICD-10-CM | POA: Diagnosis not present

## 2016-01-29 DIAGNOSIS — I2510 Atherosclerotic heart disease of native coronary artery without angina pectoris: Secondary | ICD-10-CM | POA: Diagnosis not present

## 2016-02-29 DIAGNOSIS — I10 Essential (primary) hypertension: Secondary | ICD-10-CM | POA: Diagnosis not present

## 2016-02-29 DIAGNOSIS — I2510 Atherosclerotic heart disease of native coronary artery without angina pectoris: Secondary | ICD-10-CM | POA: Diagnosis not present

## 2016-04-15 DIAGNOSIS — I10 Essential (primary) hypertension: Secondary | ICD-10-CM | POA: Diagnosis not present

## 2016-04-15 DIAGNOSIS — T82898A Other specified complication of vascular prosthetic devices, implants and grafts, initial encounter: Secondary | ICD-10-CM | POA: Diagnosis not present

## 2016-04-15 DIAGNOSIS — N400 Enlarged prostate without lower urinary tract symptoms: Secondary | ICD-10-CM | POA: Diagnosis not present

## 2016-04-15 DIAGNOSIS — I2510 Atherosclerotic heart disease of native coronary artery without angina pectoris: Secondary | ICD-10-CM | POA: Diagnosis not present

## 2016-04-15 DIAGNOSIS — Z23 Encounter for immunization: Secondary | ICD-10-CM | POA: Diagnosis not present

## 2016-04-15 DIAGNOSIS — E039 Hypothyroidism, unspecified: Secondary | ICD-10-CM | POA: Diagnosis not present

## 2016-04-15 DIAGNOSIS — R739 Hyperglycemia, unspecified: Secondary | ICD-10-CM | POA: Diagnosis not present

## 2016-04-15 DIAGNOSIS — E784 Other hyperlipidemia: Secondary | ICD-10-CM | POA: Diagnosis not present

## 2016-05-04 ENCOUNTER — Emergency Department (HOSPITAL_COMMUNITY)
Admission: EM | Admit: 2016-05-04 | Discharge: 2016-05-04 | Disposition: A | Discharge status: Home / Self Care | Payer: Medicare HMO | Attending: Emergency Medicine | Admitting: Emergency Medicine

## 2016-05-04 ENCOUNTER — Encounter (HOSPITAL_COMMUNITY): Payer: Self-pay

## 2016-05-04 DIAGNOSIS — Z951 Presence of aortocoronary bypass graft: Secondary | ICD-10-CM | POA: Insufficient documentation

## 2016-05-04 DIAGNOSIS — Z87891 Personal history of nicotine dependence: Secondary | ICD-10-CM | POA: Diagnosis not present

## 2016-05-04 DIAGNOSIS — R338 Other retention of urine: Secondary | ICD-10-CM | POA: Diagnosis not present

## 2016-05-04 DIAGNOSIS — E039 Hypothyroidism, unspecified: Secondary | ICD-10-CM | POA: Insufficient documentation

## 2016-05-04 DIAGNOSIS — I10 Essential (primary) hypertension: Secondary | ICD-10-CM | POA: Insufficient documentation

## 2016-05-04 DIAGNOSIS — Z7982 Long term (current) use of aspirin: Secondary | ICD-10-CM | POA: Diagnosis not present

## 2016-05-04 DIAGNOSIS — R310 Gross hematuria: Secondary | ICD-10-CM | POA: Diagnosis not present

## 2016-05-04 DIAGNOSIS — R339 Retention of urine, unspecified: Secondary | ICD-10-CM

## 2016-05-04 DIAGNOSIS — N3090 Cystitis, unspecified without hematuria: Secondary | ICD-10-CM | POA: Diagnosis not present

## 2016-05-04 DIAGNOSIS — I2510 Atherosclerotic heart disease of native coronary artery without angina pectoris: Secondary | ICD-10-CM | POA: Insufficient documentation

## 2016-05-04 DIAGNOSIS — N359 Urethral stricture, unspecified: Secondary | ICD-10-CM | POA: Diagnosis not present

## 2016-05-04 DIAGNOSIS — E7800 Pure hypercholesterolemia, unspecified: Secondary | ICD-10-CM | POA: Diagnosis not present

## 2016-05-04 DIAGNOSIS — N99113 Postprocedural anterior urethral stricture: Secondary | ICD-10-CM | POA: Diagnosis not present

## 2016-05-04 DIAGNOSIS — N3091 Cystitis, unspecified with hematuria: Secondary | ICD-10-CM | POA: Diagnosis not present

## 2016-05-04 DIAGNOSIS — Z79899 Other long term (current) drug therapy: Secondary | ICD-10-CM | POA: Diagnosis not present

## 2016-05-04 DIAGNOSIS — K219 Gastro-esophageal reflux disease without esophagitis: Secondary | ICD-10-CM | POA: Diagnosis not present

## 2016-05-04 LAB — I-STAT CHEM 8, ED
BUN: 13 mg/dL (ref 6–20)
CHLORIDE: 103 mmol/L (ref 101–111)
CREATININE: 1.1 mg/dL (ref 0.61–1.24)
Calcium, Ion: 1.18 mmol/L (ref 1.15–1.40)
GLUCOSE: 123 mg/dL — AB (ref 65–99)
HEMATOCRIT: 45 % (ref 39.0–52.0)
HEMOGLOBIN: 15.3 g/dL (ref 13.0–17.0)
POTASSIUM: 4 mmol/L (ref 3.5–5.1)
Sodium: 138 mmol/L (ref 135–145)
TCO2: 25 mmol/L (ref 0–100)

## 2016-05-04 LAB — URINALYSIS, ROUTINE W REFLEX MICROSCOPIC
Bilirubin Urine: NEGATIVE
GLUCOSE, UA: NEGATIVE mg/dL
KETONES UR: NEGATIVE mg/dL
Nitrite: NEGATIVE
PH: 6 (ref 5.0–8.0)
Protein, ur: 100 mg/dL — AB
SPECIFIC GRAVITY, URINE: 1.025 (ref 1.005–1.030)
SQUAMOUS EPITHELIAL / LPF: NONE SEEN

## 2016-05-04 MED ORDER — HYDROMORPHONE HCL 1 MG/ML IJ SOLN
1.0000 mg | INTRAMUSCULAR | Status: DC | PRN
  Administered 2016-05-04: 1 mg via INTRAVENOUS
  Filled 2016-05-04: qty 1

## 2016-05-04 MED ORDER — SULFAMETHOXAZOLE-TRIMETHOPRIM 800-160 MG PO TABS: 1.0000 | ORAL_TABLET | Freq: Two times a day (BID) | ORAL | 0 refills | Status: AC

## 2016-05-04 NOTE — Consult Note (Signed)
Urology Consult  Referring physician: Dr. Verdie MosherLiu Reason for referral: urinary retention, foley placement  Chief Complaint: suprapubic pain  History of Present Illness: [name] is a 76yo with a hx of BPH and urethral stricture who [facility] with a 24 hour hx of an inability to urinate. The patient has a known bulbar urethral stricture which was dilated in 12/2015 by Dr. Berneice HeinrichManny. He has urgency, a feeling of incompelete emptying and dribbling since this morning. Multiple attempts were made [facility] to [facility] a catheter which were unsuccessful. Pt denies any fevers or chills. Suprapubic pain is sharp, constant, severe and nonraditiaing  Past Medical History:  Diagnosis Date  . Benign prostatic hypertrophy   . BPH (benign prostatic hypertrophy)   . Coronary artery disease cardiologist-  dr Purvis Sheffieldkoneswaran   a. NSTEMI 07/2012 => critical LM disease on LHC => s/p CABG (L-LAD/D1, S-OM2, S-PDA/PL);  [facility]  . Exercise tolerance test normal    Dec 2014  per dr Purvis Sheffieldkoneswaran (cardiologist) note Duke treadmilll score 5.5 (low risk)  . GERD (gastroesophageal reflux disease)   . History of atrial fibrillation    post-op  CABG 04/ 2014  . History of non-ST elevation myocardial infarction (NSTEMI)    04/ 2014  . Hyperlipidemia   . Hypertension   . Hypothyroidism   . Ischemic heart disease    STABLE PER DR Purvis SheffieldKONESWARAN (CARDIOLOGIST) NOTE 06-18-2015  . Lower urinary tract symptoms (LUTS)   . RBBB (right bundle branch block)   . S/P CABG x 5    08-17-2012   LIMA sequentilly to LAD and Diagonal,  SVG to OM,  SVG seq to PDA and PLB with EVH from right thigh  . Urethral stricture    recurrent  . Wears dentures    UPPER  . Wears glasses    Past Surgical History:  Procedure Laterality Date  . CORONARY ARTERY BYPASS GRAFT N/A 08/17/2012   Procedure: CORONARY ARTERY BYPASS GRAFTING (CABG);  Surgeon: Loreli SlotSteven C Hendrickson, MD;  [facility];  Service: Open Heart Surgery;   Laterality: N/A;  L-LAD/D1, S-OM2,  S-PDA/PL  . CYSTOSCOPY N/A 08/17/2012   Procedure: CYSTOSCOPY FLEXIBLE for difficult  foley catheter insertion;  Surgeon: Sebastian Acheheodore Manny, MD;  [facility];  Service: Urology;  Laterality: N/A;  . CYSTOSCOPY N/A 01/16/2016   Procedure: CYSTOSCOPY FLEXIBLE;  Surgeon: Sebastian Acheheodore Manny, MD;  [facility] [place];  Service: Urology;  Laterality: N/A;  . CYSTOSCOPY WITH URETHRAL DILATATION N/A 01/16/2016   Procedure: CYSTOSCOPY WITH URETHRAL BALLOON DILATATION;  Surgeon: Sebastian Acheheodore Manny, MD;  [facility] [place];  Service: Urology;  Laterality: N/A;  . LEFT HEART CATHETERIZATION WITH CORONARY ANGIOGRAM N/A 08/16/2012   Procedure: LEFT HEART CATHETERIZATION WITH CORONARY ANGIOGRAM;  Surgeon: Tonny BollmanMichael Cooper, MD;  [facility];  Service: Cardiovascular;  Laterality: N/A;  NSTEMI;  critical LM stenosis extending into the ostial LAD & LCFX,  moderately tight RCA stenosis,  Normal LVF, ef 55-60%  . TRANSURETHRAL RESECTION OF PROSTATE  06/2012    Medications: I have reviewed the patient's current medications. Allergies: No Known Allergies  Family History  Problem Relation Age of Onset  . Coronary artery disease Father 5565   Social History:  reports that he quit smoking about 45 years ago. His smoking use included Cigarettes. He started smoking about 55 years ago. He has a 5.00 pack-year smoking history. He has never used smokeless tobacco. He reports that he does not drink alcohol  or use drugs.  Review of Systems  Gastrointestinal: Positive for abdominal pain.  Genitourinary: Positive for frequency and urgency.  All other systems reviewed and are negative.   Physical Exam:  Vital signs in last 24 hours: Temp:  [97.3 F (36.3 C)] 97.3 F (36.3 C) (01/14 1116) Pulse Rate:  [86] 86 (01/14 1116) Resp:  [18] 18 (01/14 1116) BP: (112)/(97) 112/97 (01/14 1116) SpO2:  [97 %] 97 % (01/14 1116) Physical Exam  Constitutional: He  is oriented to person, place, and time. He appears well-developed and well-nourished.  HENT:  Head: Normocephalic and atraumatic.  Eyes: EOM are normal. Pupils are equal, round, and reactive to light.  Neck: Normal range of motion. No thyromegaly present.  Cardiovascular: Normal rate and regular rhythm.   Respiratory: Effort normal. No respiratory distress.  GI: Soft. He exhibits no distension.  Genitourinary: Prostate normal and penis normal. No penile tenderness.  Musculoskeletal: Normal range of motion. He exhibits no edema.  Neurological: He is alert and oriented to person, place, and time.  Skin: Skin is warm and dry.  Psychiatric: He has a normal mood and affect. His behavior is normal. Judgment and thought content normal.    Laboratory Data:  Results for orders placed or performed during the hospital encounter of 05/04/16 (from the past 72 hour(s))  I-Stat Chem 8, ED     Status: Abnormal   Collection Time: 05/04/16 11:28 AM  Result Value Ref Range   Sodium 138 135 - 145 mmol/L   Potassium 4.0 3.5 - 5.1 mmol/L   Chloride 103 101 - 111 mmol/L   BUN 13 6 - 20 mg/dL   Creatinine, Ser 1.61 0.61 - 1.24 mg/dL   Glucose, Bld 096 (H) 65 - 99 mg/dL   Calcium, Ion 0.45 4.09 - 1.40 mmol/L   TCO2 25 0 - 100 mmol/L   Hemoglobin 15.3 13.0 - 17.0 g/dL   HCT 81.1 91.4 - 78.2 %   No results found for this or any previous visit (from the past 240 hour(s)). Creatinine:  Recent Labs  05/04/16 1128  CREATININE 1.10   Baseline Creatinine: 1  Foley Catheter Placemenet--Complex  Patient was prepped and draped in the usual sterile fashion using betadine solution. Using a cystoscope we perform urethroscopy and noted a dense bulbar urethral stricture and a flase passage at 6 o clock. We placedd a sensor wire throught the true lumen and into the bladder. We then used a balloon dilation catheter to dilate the stricture to 20 french.  A 16 Fr foley catheter was placed via the Blitz technique and  passed easily with immediate return of 800 cc of clear, amber colored urine without clot. 10 cc sterile water was placed in the balloon port.    Impression/Assessment:  75yo with bulbar urethral stricture and urinary retention  Plan:  1. 16 French foley placed after balloon urethral dilation at the bedside. The patient will continue foley for 1 week and followup with [facility] for a voiding trial 2. Bactrim DS BID for 7 days  [name] 05/04/2016, 12:12 PM

## 2016-05-04 NOTE — ED Notes (Signed)
Very little urine in leg bag thus far. I get him something else to drink.

## 2016-05-04 NOTE — ED Notes (Addendum)
I have obtained a few more drops of urine. I instruct pt. To drink more fluids; will check back shortly.  He remains comfortable and in no distress, and is watching a football game attentively.

## 2016-05-04 NOTE — ED Notes (Signed)
PT standing using urinal at this time. Will complete vitals after

## 2016-05-04 NOTE — ED Notes (Signed)
We are prepared for d/c. We are awaiting for sufficient urine to accumulate in leg-bag to send to lab for u/a. The specimen already sent was of a volume to perform c & S only.

## 2016-05-04 NOTE — Discharge Instructions (Signed)
Please take antibiotics as prescribed. Please call urology number above to set up appropriate follow-up within 1 week for a voiding trial  Return without fail for worsening symptoms, including fever, worsening pain, intractable vomiting, or any other symptoms concerning to you.

## 2016-05-04 NOTE — ED Triage Notes (Signed)
He states he was seen at [facility] today for urinary retention. He further states he is here to see a specialist; and has had urethral dilitation in the past. [facility] reports pt. Had a #8 french cath. In place upon their arrival, but that it "fell out" as he stood up. He arrives here in no distress.

## 2016-05-04 NOTE — ED Provider Notes (Signed)
[facility]-EMERGENCY DEPT Provider Note   CSN: [id number] Arrival date & time: 05/04/16  1043     History   Chief Complaint Chief Complaint  Patient presents with  . Urinary Retention    HPI Loann QuillJames W Edgin is a 76 y.o. male.  HPI  76 year old male who presents with urinary retention. History of BPH and recurrent urethral strictures. Followed by dr. Kathrynn RunningManning from urology. Started having difficulty urinating last night. [facility], and only 20F pediatric foley catheter placed, with minimal bloody urine output. [facility] with Dr. Ronne Binningmckenzie [facility] who [facility] for potential urethral stricture dilation. He complains of low abdominal discomfort. No N/V, fevers.   Past Medical History:  Diagnosis Date  . Benign prostatic hypertrophy   . BPH (benign prostatic hypertrophy)   . Coronary artery disease cardiologist-  dr Purvis Sheffieldkoneswaran   a. NSTEMI 07/2012 => critical LM disease on LHC => s/p CABG (L-LAD/D1, S-OM2, S-PDA/PL);  [facility]% [facility] 07/2012  . Exercise tolerance test normal    Dec 2014  per dr Purvis Sheffieldkoneswaran (cardiologist) note Duke treadmilll score 5.5 (low risk)  . GERD (gastroesophageal reflux disease)   . History of atrial fibrillation    post-op  CABG 04/ 2014  . History of non-ST elevation myocardial infarction (NSTEMI)    04/ 2014  . Hyperlipidemia   . Hypertension   . Hypothyroidism   . Ischemic heart disease    STABLE PER DR Purvis SheffieldKONESWARAN (CARDIOLOGIST) NOTE 06-18-2015  . Lower urinary tract symptoms (LUTS)   . RBBB (right bundle branch block)   . S/P CABG x 5    08-17-2012   LIMA sequentilly to LAD and Diagonal,  SVG to OM,  SVG seq to PDA and PLB with EVH from right thigh  . Urethral stricture    recurrent  . Wears dentures    UPPER  . Wears glasses     Patient Active Problem List   Diagnosis Date Noted  . Anemia 09/17/2012  . Renal insufficiency 09/17/2012  . Unspecified hypothyroidism  09/14/2012  . Leukocytosis, unspecified 08/29/2012  . Edema 08/29/2012  . Candidiasis of mouth 08/29/2012  . Hiccup 08/29/2012  . BPH (benign prostatic hyperplasia) 08/26/2012  . Other and unspecified hyperlipidemia 08/26/2012  . HTN (hypertension) 08/26/2012  . CAD (coronary artery disease) of artery bypass graft 08/24/2012  . Atrial fibrillation (HCC) 08/23/2012    Past Surgical History:  Procedure Laterality Date  . CORONARY ARTERY BYPASS GRAFT N/A 08/17/2012   Procedure: CORONARY ARTERY BYPASS GRAFTING (CABG);  Surgeon: Loreli SlotSteven C Hendrickson, MD;  [facility];  Service: Open Heart Surgery;  Laterality: N/A;  L-LAD/D1, S-OM2,  S-PDA/PL  . CYSTOSCOPY N/A 08/17/2012   Procedure: CYSTOSCOPY FLEXIBLE for difficult  foley catheter insertion;  Surgeon: Sebastian Acheheodore Manny, MD;  [facility];  Service: Urology;  Laterality: N/A;  . CYSTOSCOPY N/A 01/16/2016   Procedure: CYSTOSCOPY FLEXIBLE;  Surgeon: Sebastian Acheheodore Manny, MD;  [facility] [place];  Service: Urology;  Laterality: N/A;  . CYSTOSCOPY WITH URETHRAL DILATATION N/A 01/16/2016   Procedure: CYSTOSCOPY WITH URETHRAL BALLOON DILATATION;  Surgeon: Sebastian Acheheodore Manny, MD;  [facility] [place];  Service: Urology;  Laterality: N/A;  . LEFT HEART CATHETERIZATION WITH CORONARY ANGIOGRAM N/A 08/16/2012   Procedure: LEFT HEART CATHETERIZATION WITH CORONARY ANGIOGRAM;  Surgeon: Tonny BollmanMichael Cooper, MD;  [facility];  Service: Cardiovascular;  Laterality: N/A;  NSTEMI;  critical LM stenosis extending into the  ostial LAD & LCFX,  moderately tight RCA stenosis,  Normal LVF, ef 55-60%  . TRANSURETHRAL RESECTION OF PROSTATE  06/2012       Home Medications    Prior to Admission medications   Medication Sig Start Date End Date Taking? Authorizing Provider  amLODipine (NORVASC) 10 MG tablet Take 10 mg by mouth every morning.     Historical Provider, MD  aspirin EC 81 MG tablet Take 1 tablet (81 mg total) by mouth daily.  06/06/14   Laqueta Linden, MD  atorvastatin (LIPITOR) 20 MG tablet Take 20 mg by mouth every morning.     Historical Provider, MD  levothyroxine (SYNTHROID, LEVOTHROID) 25 MCG tablet Take 1 tablet (25 mcg total) by mouth daily before breakfast. 02/03/13   Avon Gully, MD  metoprolol tartrate (LOPRESSOR) 25 MG tablet Take 25 mg by mouth every morning.  04/22/14   Historical Provider, MD  omeprazole (PRILOSEC) 20 MG capsule Take 20 mg by mouth every morning.     Historical Provider, MD  sulfamethoxazole-trimethoprim (BACTRIM DS,SEPTRA DS) 800-160 MG tablet Take 1 tablet by mouth 2 (two) times daily. 05/04/16 05/11/16  Lavera Guise, MD  tamsulosin (FLOMAX) 0.4 MG CAPS Take 0.4 mg by mouth daily after breakfast.    Historical Provider, MD  traMADol (ULTRAM) 50 MG tablet Take 1-2 tablets (50-100 mg total) by mouth every 6 (six) hours as needed for moderate pain or severe pain. Post-operatively 01/16/16   Sebastian Ache, MD    Family History Family History  Problem Relation Age of Onset  . Coronary artery disease Father 46    Social History Social History  Substance Use Topics  . Smoking status: Former Smoker    Packs/day: 0.50    Years: 10.00    Types: Cigarettes    Start date: 08/05/1960    Quit date: 08/06/1970  . Smokeless tobacco: Never Used  . Alcohol use No     Allergies   Patient has no known allergies.   Review of Systems Review of Systems 10/14 systems reviewed and are negative other than those stated in the HPI    Physical Exam Updated Vital Signs BP 112/97   Pulse 86   Temp 97.3 F (36.3 C)   Resp 18   SpO2 97%   Physical Exam Physical Exam  Nursing note and vitals reviewed. Constitutional: Well developed, well nourished, non-toxic, and in no acute distress Head: Normocephalic and atraumatic.  Mouth/Throat: Oropharynx is clear and moist.  Neck: Normal range of motion. Neck supple.  Cardiovascular: Normal rate and regular rhythm.   Pulmonary/Chest: Effort  normal and breath sounds normal.  Abdominal: Soft. There is low abdominal tenderness. There is no rebound and no guarding.  Musculoskeletal: Normal range of motion.  Neurological: Alert, no facial droop, fluent speech, moves all extremities symmetrically Skin: Skin is warm and dry.  Psychiatric: Cooperative   ED Treatments / Results  Labs (all labs ordered are listed, but only abnormal results are displayed) Labs Reviewed  I-STAT CHEM 8, ED - Abnormal; Notable for the following:       Result Value   Glucose, Bld 123 (*)    All other components within normal limits  URINE CULTURE  URINALYSIS, ROUTINE W REFLEX MICROSCOPIC    EKG  EKG Interpretation None       Radiology No results found.  Procedures Procedures (including critical care time)  Medications Ordered in ED Medications  HYDROmorphone (DILAUDID) injection 1 mg (1 mg Intravenous Given 05/04/16 1221)  Initial Impression / Assessment and Plan / ED Course  I have reviewed the triage vital signs and the nursing notes.  Pertinent labs & imaging results that were available during my care of the patient were reviewed by me and considered in my medical decision making (see chart for details).  Clinical Course    Patient presented to ED with hematuria urinary retention with concern for recurrent ureteral stricture. Dr. Thea Silversmith did see in the emergency department, performed urethral dilation, and Foley catheter placed. Recommending Bactrim for one week and follow-up in the urology office in one week for voiding trial. With stable renal function, hemodynamically stable, and feeling back at baseline once a Foley has been placed.  Strict return and follow-up instructions reviewed. He expressed understanding of all discharge instructions and felt comfortable with the plan of care.   Final Clinical Impressions(s) / ED Diagnoses   Final diagnoses:  Urinary retention  Gross hematuria    New Prescriptions New  Prescriptions   SULFAMETHOXAZOLE-TRIMETHOPRIM (BACTRIM DS,SEPTRA DS) 800-160 MG TABLET    Take 1 tablet by mouth 2 (two) times daily.     Lavera Guise, MD 05/04/16 1308

## 2016-05-04 NOTE — ED Notes (Signed)
Unable to complete PT vitals at this time. PT using urinal

## 2016-05-04 NOTE — ED Notes (Signed)
Dr. Ronne BinningMcKenzie has been with the pt. For some time now; and he is continuing to perform catheter insertion.

## 2016-05-04 NOTE — ED Notes (Signed)
Bed: [id number] Expected date:  Expected time:  Means of arrival:  Comments: PT in rm

## 2016-05-04 NOTE — ED Notes (Signed)
His foley cath. Is draining amber urine. Pt. Is in no distress and is drinking Sprite per his request.

## 2016-05-05 LAB — URINE CULTURE

## 2016-05-12 DIAGNOSIS — R338 Other retention of urine: Secondary | ICD-10-CM | POA: Diagnosis not present

## 2016-05-16 ENCOUNTER — Ambulatory Visit: Payer: Commercial Managed Care - HMO | Admitting: Cardiovascular Disease

## 2016-05-16 DIAGNOSIS — I2510 Atherosclerotic heart disease of native coronary artery without angina pectoris: Secondary | ICD-10-CM | POA: Diagnosis not present

## 2016-05-16 DIAGNOSIS — I10 Essential (primary) hypertension: Secondary | ICD-10-CM | POA: Diagnosis not present

## 2016-05-27 DIAGNOSIS — R338 Other retention of urine: Secondary | ICD-10-CM | POA: Diagnosis not present

## 2016-05-27 DIAGNOSIS — N5201 Erectile dysfunction due to arterial insufficiency: Secondary | ICD-10-CM | POA: Diagnosis not present

## 2016-05-27 DIAGNOSIS — N35011 Post-traumatic bulbous urethral stricture: Secondary | ICD-10-CM | POA: Diagnosis not present

## 2016-06-17 ENCOUNTER — Ambulatory Visit: Payer: Commercial Managed Care - HMO | Admitting: Cardiovascular Disease

## 2016-06-23 DIAGNOSIS — I10 Essential (primary) hypertension: Secondary | ICD-10-CM | POA: Diagnosis not present

## 2016-06-23 DIAGNOSIS — I2510 Atherosclerotic heart disease of native coronary artery without angina pectoris: Secondary | ICD-10-CM | POA: Diagnosis not present

## 2016-06-24 ENCOUNTER — Ambulatory Visit: Payer: Commercial Managed Care - HMO | Admitting: Cardiovascular Disease

## 2016-07-14 ENCOUNTER — Encounter: Payer: Self-pay | Admitting: Cardiovascular Disease

## 2016-07-14 ENCOUNTER — Ambulatory Visit (INDEPENDENT_AMBULATORY_CARE_PROVIDER_SITE_OTHER): Payer: Medicare HMO | Admitting: Cardiovascular Disease

## 2016-07-14 VITALS — BP 122/60 | HR 87 | Ht 71.0 in | Wt 196.0 lb

## 2016-07-14 DIAGNOSIS — I10 Essential (primary) hypertension: Secondary | ICD-10-CM | POA: Diagnosis not present

## 2016-07-14 DIAGNOSIS — E7800 Pure hypercholesterolemia, unspecified: Secondary | ICD-10-CM

## 2016-07-14 DIAGNOSIS — I252 Old myocardial infarction: Secondary | ICD-10-CM

## 2016-07-14 DIAGNOSIS — I25812 Atherosclerosis of bypass graft of coronary artery of transplanted heart without angina pectoris: Secondary | ICD-10-CM

## 2016-07-14 NOTE — Patient Instructions (Signed)
Your physician wants you to follow-up in: 1 YEAR WITH DR. KONESWAREN You will receive a reminder letter in the mail two months in advance. If you don't receive a letter, please call our office to schedule the follow-up appointment.  Your physician recommends that you continue on your current medications as directed. Please refer to the Current Medication list given to you today.  

## 2016-07-14 NOTE — Progress Notes (Signed)
SUBJECTIVE: The patient presents for routine follow up. He went underwent a normal exercise treadmill stress test which was deemed low risk in December 2014 with a Duke treadmill score of 5.5. He has a history of coronary artery disease and CABG in 2014 after sustaining a non-STEMI. He also has a history of hypertension and hyperlipidemia. He has normal left ventricular systolic function.   He is doing well and denies exertional chest pain and dyspnea. 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  ECG performed the office today shows sinus rhythm with an old right bundle branch block.   Review of Systems: As per "subjective", otherwise negative.  No Known Allergies  Current Outpatient Prescriptions  Medication Sig Dispense Refill  . amLODipine (NORVASC) 10 MG tablet Take 10 mg by mouth every morning.     Marland Kitchen. aspirin EC 81 MG tablet Take 1 tablet (81 mg total) by mouth daily.    Marland Kitchen. atorvastatin (LIPITOR) 20 MG tablet Take 20 mg by mouth every morning.     Marland Kitchen. levothyroxine (SYNTHROID, LEVOTHROID) 25 MCG tablet Take 1 tablet (25 mcg total) by mouth daily before breakfast.    . metoprolol tartrate (LOPRESSOR) 25 MG tablet Take 25 mg by mouth every morning.   3  . omeprazole (PRILOSEC) 20 MG capsule Take 20 mg by mouth every morning.     . tamsulosin (FLOMAX) 0.4 MG CAPS Take 0.4 mg by mouth daily after breakfast.    . traMADol (ULTRAM) 50 MG tablet Take 1-2 tablets (50-100 mg total) by mouth every 6 (six) hours as needed for moderate pain or severe pain. Post-operatively 30 tablet 0   No current facility-administered medications for this visit.     Past Medical History:  Diagnosis Date  . Benign prostatic hypertrophy   . BPH (benign prostatic hypertrophy)   . Coronary artery disease cardiologist-  dr Purvis Sheffieldkoneswaran   a. NSTEMI 07/2012 => critical LM disease on LHC => s/p CABG (L-LAD/D1, S-OM2, S-PDA/PL);  [facility]2012  .  Exercise tolerance test normal    Dec 2014  per dr Purvis Sheffieldkoneswaran (cardiologist) note Duke treadmilll score 5.5 (low risk)  . GERD (gastroesophageal reflux disease)   . History of atrial fibrillation    post-op  CABG 04/ 2014  . History of non-ST elevation myocardial infarction (NSTEMI)    04/ 2014  . Hyperlipidemia   . Hypertension   . Hypothyroidism   . Ischemic heart disease    STABLE PER DR Purvis SheffieldKONESWARAN (CARDIOLOGIST) NOTE 06-18-2015  . Lower urinary tract symptoms (LUTS)   . RBBB (right bundle branch block)   . S/P CABG x 5    08-17-2012   LIMA sequentilly to LAD and Diagonal,  SVG to OM,  SVG seq to PDA and PLB with EVH from right thigh  . Urethral stricture    recurrent  . Wears dentures    UPPER  . Wears glasses     Past Surgical History:  Procedure Laterality Date  . CORONARY ARTERY BYPASS GRAFT N/A 08/17/2012   Procedure: CORONARY ARTERY BYPASS GRAFTING (CABG);  Surgeon: Loreli SlotSteven C Hendrickson, MD;  [facility];  Service: Open Heart Surgery;  Laterality: N/A;  L-LAD/D1, S-OM2,  S-PDA/PL  . CYSTOSCOPY N/A 08/17/2012   Procedure: CYSTOSCOPY FLEXIBLE for difficult  foley catheter insertion;  Surgeon: Sebastian Acheheodore Manny, MD;  [facility];  Service: Urology;  Laterality: N/A;  . CYSTOSCOPY N/A 01/16/2016  Procedure: CYSTOSCOPY FLEXIBLE;  Surgeon: Sebastian Ache, MD;  [facility];  Service: Urology;  Laterality: N/A;  . CYSTOSCOPY WITH URETHRAL DILATATION N/A 01/16/2016   Procedure: CYSTOSCOPY WITH URETHRAL BALLOON DILATATION;  Surgeon: Sebastian Ache, MD;  Location: [facility];  Service: Urology;  Laterality: N/A;  . LEFT HEART CATHETERIZATION WITH CORONARY ANGIOGRAM N/A 08/16/2012   Procedure: LEFT HEART CATHETERIZATION WITH CORONARY ANGIOGRAM;  Surgeon: Tonny Bollman, MD;  [facility];  Service: Cardiovascular;  Laterality: N/A;  NSTEMI;  critical LM stenosis extending into the ostial LAD & LCFX,  moderately tight RCA stenosis,   Normal LVF, ef 55-60%  . TRANSURETHRAL RESECTION OF PROSTATE  06/2012    Social History   Social History  . Marital status: Married    Spouse name: N/A  . Number of children: N/A  . Years of education: N/A   Occupational History  . Not on file.   Social History Main Topics  . Smoking status: Former Smoker    Packs/day: 0.50    Years: 10.00    Types: Cigarettes    Start date: 08/05/1960    Quit date: 08/06/1970  . Smokeless tobacco: Never Used  . Alcohol use No  . Drug use: No  . Sexual activity: Not on file   Other Topics Concern  . Not on file   Social History Narrative  . No narrative on file     Vitals:   07/14/16 1418  BP: 122/60  Pulse: 87  SpO2: 98%  Weight: 196 lb (88.9 kg)  Height: 5\' 11"  (1.803 m)    PHYSICAL EXAM General: NAD HEENT: Normal. Neck: No JVD, no thyromegaly. Lungs: Clear to auscultation bilaterally with normal respiratory effort. CV: Nondisplaced PMI.  Regular rate and rhythm, normal S1/split S2, no S3/S4, no murmur. No pretibial or periankle edema.  No carotid bruit.   Abdomen: Soft, nontender, no distention.  Neurologic: Alert and oriented.  Psych: Normal affect. Skin: Normal. Musculoskeletal: No gross deformities.    ECG: Most recent ECG reviewed.      ASSESSMENT AND PLAN: 1. CAD with history of CABG and NSTEMI: Stable ischemic heart disease. Continue metoprolol and Lipitor along with ASA 81 mg. Does not require any noninvasive testing at this time.  2. Hyperlipidemia: Continue Lipitor 20 mg daily. Will obtain copy of lipids from PCP.  3. Essential hypertension: Controlled. No changes.   Dispo: f/u 1 year.   Prentice Docker, M.D., F.A.C.C.

## 2016-07-15 ENCOUNTER — Ambulatory Visit: Payer: Commercial Managed Care - HMO | Admitting: Cardiovascular Disease

## 2016-07-24 DIAGNOSIS — I10 Essential (primary) hypertension: Secondary | ICD-10-CM | POA: Diagnosis not present

## 2016-07-24 DIAGNOSIS — I2510 Atherosclerotic heart disease of native coronary artery without angina pectoris: Secondary | ICD-10-CM | POA: Diagnosis not present

## 2016-08-13 ENCOUNTER — Ambulatory Visit: Payer: Commercial Managed Care - HMO | Admitting: Cardiovascular Disease

## 2016-08-23 DIAGNOSIS — I10 Essential (primary) hypertension: Secondary | ICD-10-CM | POA: Diagnosis not present

## 2016-08-23 DIAGNOSIS — I2510 Atherosclerotic heart disease of native coronary artery without angina pectoris: Secondary | ICD-10-CM | POA: Diagnosis not present

## 2016-09-23 DIAGNOSIS — I2510 Atherosclerotic heart disease of native coronary artery without angina pectoris: Secondary | ICD-10-CM | POA: Diagnosis not present

## 2016-09-23 DIAGNOSIS — I10 Essential (primary) hypertension: Secondary | ICD-10-CM | POA: Diagnosis not present

## 2016-11-06 DIAGNOSIS — I2510 Atherosclerotic heart disease of native coronary artery without angina pectoris: Secondary | ICD-10-CM | POA: Diagnosis not present

## 2016-11-06 DIAGNOSIS — M1990 Unspecified osteoarthritis, unspecified site: Secondary | ICD-10-CM | POA: Diagnosis not present

## 2016-11-06 DIAGNOSIS — I10 Essential (primary) hypertension: Secondary | ICD-10-CM | POA: Diagnosis not present

## 2016-12-02 DIAGNOSIS — N35011 Post-traumatic bulbous urethral stricture: Secondary | ICD-10-CM | POA: Diagnosis not present

## 2016-12-02 DIAGNOSIS — R300 Dysuria: Secondary | ICD-10-CM | POA: Diagnosis not present

## 2017-01-05 DIAGNOSIS — I2510 Atherosclerotic heart disease of native coronary artery without angina pectoris: Secondary | ICD-10-CM | POA: Diagnosis not present

## 2017-01-05 DIAGNOSIS — I10 Essential (primary) hypertension: Secondary | ICD-10-CM | POA: Diagnosis not present

## 2017-02-04 DIAGNOSIS — I2510 Atherosclerotic heart disease of native coronary artery without angina pectoris: Secondary | ICD-10-CM | POA: Diagnosis not present

## 2017-02-04 DIAGNOSIS — I10 Essential (primary) hypertension: Secondary | ICD-10-CM | POA: Diagnosis not present

## 2017-02-09 DIAGNOSIS — R3914 Feeling of incomplete bladder emptying: Secondary | ICD-10-CM | POA: Diagnosis not present

## 2017-02-09 DIAGNOSIS — N35011 Post-traumatic bulbous urethral stricture: Secondary | ICD-10-CM | POA: Diagnosis not present

## 2017-02-09 DIAGNOSIS — R300 Dysuria: Secondary | ICD-10-CM | POA: Diagnosis not present

## 2017-02-13 DIAGNOSIS — N35011 Post-traumatic bulbous urethral stricture: Secondary | ICD-10-CM | POA: Diagnosis not present

## 2017-02-13 DIAGNOSIS — Z23 Encounter for immunization: Secondary | ICD-10-CM | POA: Diagnosis not present

## 2017-02-13 DIAGNOSIS — I10 Essential (primary) hypertension: Secondary | ICD-10-CM | POA: Diagnosis not present

## 2017-02-13 DIAGNOSIS — R339 Retention of urine, unspecified: Secondary | ICD-10-CM | POA: Diagnosis not present

## 2017-02-13 DIAGNOSIS — R3914 Feeling of incomplete bladder emptying: Secondary | ICD-10-CM | POA: Diagnosis not present

## 2017-02-13 DIAGNOSIS — M79671 Pain in right foot: Secondary | ICD-10-CM | POA: Diagnosis not present

## 2017-02-13 DIAGNOSIS — Z01818 Encounter for other preprocedural examination: Secondary | ICD-10-CM | POA: Diagnosis not present

## 2017-02-13 DIAGNOSIS — K219 Gastro-esophageal reflux disease without esophagitis: Secondary | ICD-10-CM | POA: Diagnosis not present

## 2017-02-13 DIAGNOSIS — N419 Inflammatory disease of prostate, unspecified: Secondary | ICD-10-CM | POA: Diagnosis not present

## 2017-02-13 DIAGNOSIS — R338 Other retention of urine: Secondary | ICD-10-CM | POA: Diagnosis not present

## 2017-02-13 DIAGNOSIS — E7800 Pure hypercholesterolemia, unspecified: Secondary | ICD-10-CM | POA: Diagnosis not present

## 2017-02-13 DIAGNOSIS — N401 Enlarged prostate with lower urinary tract symptoms: Secondary | ICD-10-CM | POA: Diagnosis not present

## 2017-02-14 DIAGNOSIS — R338 Other retention of urine: Secondary | ICD-10-CM | POA: Diagnosis not present

## 2017-02-14 DIAGNOSIS — N35011 Post-traumatic bulbous urethral stricture: Secondary | ICD-10-CM | POA: Diagnosis not present

## 2017-02-23 DIAGNOSIS — E7800 Pure hypercholesterolemia, unspecified: Secondary | ICD-10-CM | POA: Diagnosis not present

## 2017-02-23 DIAGNOSIS — I10 Essential (primary) hypertension: Secondary | ICD-10-CM | POA: Diagnosis not present

## 2017-02-23 DIAGNOSIS — Z79899 Other long term (current) drug therapy: Secondary | ICD-10-CM | POA: Diagnosis not present

## 2017-02-23 DIAGNOSIS — K219 Gastro-esophageal reflux disease without esophagitis: Secondary | ICD-10-CM | POA: Diagnosis not present

## 2017-02-23 DIAGNOSIS — T83011A Breakdown (mechanical) of indwelling urethral catheter, initial encounter: Secondary | ICD-10-CM | POA: Diagnosis not present

## 2017-03-10 DIAGNOSIS — N35011 Post-traumatic bulbous urethral stricture: Secondary | ICD-10-CM | POA: Diagnosis not present

## 2017-03-17 DIAGNOSIS — R05 Cough: Secondary | ICD-10-CM | POA: Diagnosis not present

## 2017-03-17 DIAGNOSIS — I10 Essential (primary) hypertension: Secondary | ICD-10-CM | POA: Diagnosis not present

## 2017-03-17 DIAGNOSIS — Z79899 Other long term (current) drug therapy: Secondary | ICD-10-CM | POA: Diagnosis not present

## 2017-03-17 DIAGNOSIS — J209 Acute bronchitis, unspecified: Secondary | ICD-10-CM | POA: Diagnosis not present

## 2017-03-17 DIAGNOSIS — Z7982 Long term (current) use of aspirin: Secondary | ICD-10-CM | POA: Diagnosis not present

## 2017-03-17 DIAGNOSIS — E039 Hypothyroidism, unspecified: Secondary | ICD-10-CM | POA: Diagnosis not present

## 2017-03-17 DIAGNOSIS — E7800 Pure hypercholesterolemia, unspecified: Secondary | ICD-10-CM | POA: Diagnosis not present

## 2017-04-02 DIAGNOSIS — N400 Enlarged prostate without lower urinary tract symptoms: Secondary | ICD-10-CM | POA: Diagnosis not present

## 2017-04-02 DIAGNOSIS — Z0000 Encounter for general adult medical examination without abnormal findings: Secondary | ICD-10-CM | POA: Diagnosis not present

## 2017-04-02 DIAGNOSIS — T82898A Other specified complication of vascular prosthetic devices, implants and grafts, initial encounter: Secondary | ICD-10-CM | POA: Diagnosis not present

## 2017-04-02 DIAGNOSIS — I2510 Atherosclerotic heart disease of native coronary artery without angina pectoris: Secondary | ICD-10-CM | POA: Diagnosis not present

## 2017-04-02 DIAGNOSIS — R739 Hyperglycemia, unspecified: Secondary | ICD-10-CM | POA: Diagnosis not present

## 2017-04-02 DIAGNOSIS — E7849 Other hyperlipidemia: Secondary | ICD-10-CM | POA: Diagnosis not present

## 2017-04-02 DIAGNOSIS — E039 Hypothyroidism, unspecified: Secondary | ICD-10-CM | POA: Diagnosis not present

## 2017-04-02 DIAGNOSIS — I10 Essential (primary) hypertension: Secondary | ICD-10-CM | POA: Diagnosis not present

## 2017-04-02 DIAGNOSIS — Z1389 Encounter for screening for other disorder: Secondary | ICD-10-CM | POA: Diagnosis not present

## 2017-04-02 DIAGNOSIS — Z0001 Encounter for general adult medical examination with abnormal findings: Secondary | ICD-10-CM | POA: Diagnosis not present

## 2017-04-08 ENCOUNTER — Encounter: Payer: Self-pay | Admitting: Cardiovascular Disease

## 2017-04-08 ENCOUNTER — Ambulatory Visit (INDEPENDENT_AMBULATORY_CARE_PROVIDER_SITE_OTHER): Payer: Medicare HMO | Admitting: Cardiovascular Disease

## 2017-04-08 VITALS — BP 160/70 | HR 92 | Ht 69.5 in | Wt 194.0 lb

## 2017-04-08 DIAGNOSIS — E7849 Other hyperlipidemia: Secondary | ICD-10-CM | POA: Diagnosis not present

## 2017-04-08 DIAGNOSIS — I252 Old myocardial infarction: Secondary | ICD-10-CM | POA: Diagnosis not present

## 2017-04-08 DIAGNOSIS — I10 Essential (primary) hypertension: Secondary | ICD-10-CM

## 2017-04-08 DIAGNOSIS — I25708 Atherosclerosis of coronary artery bypass graft(s), unspecified, with other forms of angina pectoris: Secondary | ICD-10-CM | POA: Diagnosis not present

## 2017-04-08 DIAGNOSIS — R0989 Other specified symptoms and signs involving the circulatory and respiratory systems: Secondary | ICD-10-CM

## 2017-04-08 MED ORDER — METOPROLOL TARTRATE 25 MG PO TABS: 25.0000 mg | ORAL_TABLET | Freq: Two times a day (BID) | ORAL | 11 refills | Status: DC

## 2017-04-08 MED ORDER — LOSARTAN POTASSIUM 25 MG PO TABS: 25.0000 mg | ORAL_TABLET | Freq: Every day | ORAL | 11 refills | Status: DC

## 2017-04-08 NOTE — Patient Instructions (Addendum)
Medication Instructions:   Increase Lopressor to twice a day.  Begin Losartan 25mg  daily.  Continue all other medications.    Labwork: none  Testing/Procedures:  Your physician has requested that you have a carotid duplex. This test is an ultrasound of the carotid arteries in your neck. It looks at blood flow through these arteries that supply the brain with blood. Allow one hour for this exam. There are no restrictions or special instructions.  Office will contact with results via phone or letter.    Follow-Up: Your physician wants you to follow up in:  1 year.  You will receive a reminder letter in the mail one-two months in advance.  If you don't receive a letter, please call our office to schedule the follow up appointment   Any Other Special Instructions Will Be Listed Below (If Applicable).  If you need a refill on your cardiac medications before your next appointment, please call your pharmacy.

## 2017-04-08 NOTE — Progress Notes (Signed)
SUBJECTIVE: The patient presents for follow-up of coronary artery disease with a history of CABG in 2014 after sustaining a non-STEMI.  He also has hypertension and hyperlipidemia.  Labs 05/04/16: BUN 13, creatinine 1.1.  He said he had some issues with his prostate this past October and had some dysuria.  He is undergoing prostate surgery at [facility] in January 2019.  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  Blood pressure is elevated in our office today, 160/70.  He said systolic blood pressure was in the 150 range at his PCPs office about a week ago.  He denies chest pain and shortness of breath.  He said he is getting over a cold.   Review of Systems: As per "subjective", otherwise negative.  No Known Allergies  Current Outpatient Medications  Medication Sig Dispense Refill  . amLODipine (NORVASC) 10 MG tablet Take 10 mg by mouth every morning.     Marland Kitchen. aspirin EC 81 MG tablet Take 1 tablet (81 mg total) by mouth daily.    Marland Kitchen. atorvastatin (LIPITOR) 20 MG tablet Take 20 mg by mouth every morning.     Marland Kitchen. levothyroxine (SYNTHROID, LEVOTHROID) 25 MCG tablet Take 1 tablet (25 mcg total) by mouth daily before breakfast.    . metoprolol tartrate (LOPRESSOR) 25 MG tablet Take 25 mg by mouth every morning.   3  . omeprazole (PRILOSEC) 20 MG capsule Take 20 mg by mouth every morning.     . tamsulosin (FLOMAX) 0.4 MG CAPS Take 0.4 mg by mouth daily after breakfast.    . traMADol (ULTRAM) 50 MG tablet Take 1-2 tablets (50-100 mg total) by mouth every 6 (six) hours as needed for moderate pain or severe pain. Post-operatively 30 tablet 0   No current facility-administered medications for this visit.     Past Medical History:  Diagnosis Date  . Benign prostatic hypertrophy   . BPH (benign prostatic hypertrophy)   . Coronary artery disease cardiologist-  dr Purvis Sheffieldkoneswaran   a. NSTEMI 07/2012 => critical LM disease on LHC => s/p CABG (L-LAD/D1, S-OM2, S-PDA/PL);  [facility] 07/[facility]  . Exercise tolerance test normal    Dec 2014  per dr Purvis Sheffieldkoneswaran (cardiologist) note [facility] treadmilll score 5.5 (low risk)  . GERD (gastroesophageal reflux disease)   . History of atrial fibrillation    post-op  CABG 04/ 2014  . History of non-ST elevation myocardial infarction (NSTEMI)    04/ 2014  . Hyperlipidemia   . Hypertension   . Hypothyroidism   . Ischemic heart disease    STABLE PER DR Purvis SheffieldKONESWARAN (CARDIOLOGIST) NOTE 06-18-2015  . Lower urinary tract symptoms (LUTS)   . RBBB (right bundle branch block)   . S/P CABG x 5    08-17-2012   LIMA sequentilly to LAD and Diagonal,  SVG to OM,  SVG seq to PDA and PLB with EVH from right thigh  . Urethral stricture    recurrent  . Wears dentures    UPPER  . Wears glasses     Past Surgical History:  Procedure Laterality Date  . CORONARY ARTERY BYPASS GRAFT N/A 08/17/2012   Procedure: CORONARY ARTERY BYPASS GRAFTING (CABG);  Surgeon: Loreli SlotSteven C Hendrickson, MD;  [facility];  Service: Open Heart Surgery;  Laterality: N/A;  L-LAD/D1, S-OM2,  S-PDA/PL  . CYSTOSCOPY N/A 08/17/2012   Procedure: CYSTOSCOPY FLEXIBLE for difficult  foley catheter insertion;  Surgeon: Sebastian Acheheodore Manny, MD;  Location: [facility] OR;  Service: Urology;  Laterality: N/A;  . CYSTOSCOPY N/A 01/16/2016   Procedure: CYSTOSCOPY FLEXIBLE;  Surgeon: Sebastian Ache, MD;  Location: [facility];  Service: Urology;  Laterality: N/A;  . CYSTOSCOPY WITH URETHRAL DILATATION N/A 01/16/2016   Procedure: CYSTOSCOPY WITH URETHRAL BALLOON DILATATION;  Surgeon: Sebastian Ache, MD;  Location: [facility];  Service: Urology;  Laterality: N/A;  . LEFT HEART CATHETERIZATION WITH CORONARY ANGIOGRAM N/A 08/16/2012   Procedure: LEFT HEART CATHETERIZATION WITH CORONARY ANGIOGRAM;  Surgeon: Tonny Bollman, MD;  Location: [facility] CATH LAB;  Service: Cardiovascular;  Laterality: N/A;  NSTEMI;  critical LM stenosis extending into the ostial LAD & LCFX,  moderately  tight RCA stenosis,  Normal LVF, ef 55-60%  . TRANSURETHRAL RESECTION OF PROSTATE  06/2012    Social History   Socioeconomic History  . Marital status: Married    Spouse name: Not on file  . Number of children: Not on file  . Years of education: Not on file  . Highest education level: Not on file  Social Needs  . Financial resource strain: Not on file  . Food insecurity - worry: Not on file  . Food insecurity - inability: Not on file  . Transportation needs - medical: Not on file  . Transportation needs - non-medical: Not on file  Occupational History  . Not on file  Tobacco Use  . Smoking status: Former Smoker    Packs/day: 0.50    Years: 10.00    Pack years: 5.00    Types: Cigarettes    Start date: 08/05/1960    Last attempt to quit: 08/06/1970    Years since quitting: 46.7  . Smokeless tobacco: Never Used  Substance and Sexual Activity  . Alcohol use: No    Alcohol/week: 0.0 oz  . Drug use: No  . Sexual activity: Not on file  Other Topics Concern  . Not on file  Social History Narrative  . Not on file     Vitals:   04/08/17 1145  BP: (!) 160/70  Pulse: 92  SpO2: 95%  Weight: 194 lb (88 kg)  Height: 5' 9.5" (1.765 m)    Wt Readings from Last 3 Encounters:  04/08/17 194 lb (88 kg)  07/14/16 196 lb (88.9 kg)  01/16/16 190 lb 8 oz (86.4 kg)     PHYSICAL EXAM General: NAD HEENT: Normal. Neck: No JVD, no thyromegaly. Lungs: Clear to auscultation bilaterally with normal respiratory effort. CV: Regular rate and rhythm, normal S1/S2, no S3/S4, no murmur. No pretibial or periankle edema.  Right carotid bruit.   Abdomen: Soft, nontender, no distention.  Neurologic: Alert and oriented.  Psych: Normal affect. Skin: Normal. Musculoskeletal: No gross deformities.    ECG: Most recent ECG reviewed.   Labs: Lab Results  Component Value Date/Time   K 4.0 05/04/2016 11:28 AM   BUN 13 05/04/2016 11:28 AM   CREATININE 1.10 05/04/2016 11:28 AM   ALT 76 (H)  02/01/2013 12:29 PM   TSH 26.32 (H) 02/01/2013 12:29 PM   HGB 15.3 05/04/2016 11:28 AM     Lipids: No results found for: LDLCALC, LDLDIRECT, CHOL, TRIG, HDL     ASSESSMENT AND PLAN:  1. CAD with history of CABG and NSTEMI: Stable ischemic heart disease. Continue metoprolol (reminded him to take it bid) and Lipitor along with ASA 81 mg. Does not require any noninvasive testing at this time.  2. Hyperlipidemia: Continue Lipitor 20 mg daily. Will obtain copy of lipids from PCP.  3. Chronic  hypertension: Elevated.  I will start losartan 25 mg daily.  I also asked him to take his metoprolol tartrate twice daily.  4.  Right carotid bruit: I will obtain carotid Dopplers.    Disposition: Follow up 1 year   Prentice DockerSuresh Kalen Ratajczak, M.D., F.A.C.C.

## 2017-04-15 ENCOUNTER — Ambulatory Visit (INDEPENDENT_AMBULATORY_CARE_PROVIDER_SITE_OTHER): Payer: Medicare HMO

## 2017-04-15 DIAGNOSIS — R0989 Other specified symptoms and signs involving the circulatory and respiratory systems: Secondary | ICD-10-CM

## 2017-04-16 ENCOUNTER — Encounter: Payer: Self-pay | Admitting: *Deleted

## 2017-04-16 ENCOUNTER — Telehealth: Payer: Self-pay | Admitting: *Deleted

## 2017-04-16 DIAGNOSIS — I25700 Atherosclerosis of coronary artery bypass graft(s), unspecified, with unstable angina pectoris: Secondary | ICD-10-CM

## 2017-04-16 NOTE — Telephone Encounter (Signed)
Information faxed to Dr. Pernell DupreAdams office per patient request.

## 2017-04-16 NOTE — Telephone Encounter (Signed)
Spoke with patient to advise him of the requested stress test from [facility], and he stated that he did not need a stress test and did not want to do this. Patient advised that Dr. Caryl Aspouglas Adams (Occupational Health) would be notified.

## 2017-04-16 NOTE — Telephone Encounter (Signed)
Per Occupational Health request, Dr. Purvis SheffieldKoneswaran ordered a GXT holding metoprolol. DX: CAD

## 2017-04-16 NOTE — Progress Notes (Signed)
W

## 2017-04-17 LAB — VAS US CAROTID
LCCADSYS: -99 cm/s
LEFT ECA DIAS: 0 cm/s
LEFT VERTEBRAL DIAS: -13 cm/s
LICADDIAS: -24 cm/s
LICAPDIAS: -18 cm/s
LICAPSYS: -76 cm/s
Left CCA dist dias: -18 cm/s
Left CCA prox dias: 20 cm/s
Left CCA prox sys: 158 cm/s
Left ICA dist sys: -82 cm/s
RCCAPDIAS: 0 cm/s
RIGHT ECA DIAS: 0 cm/s
RIGHT VERTEBRAL DIAS: -13 cm/s
Right CCA prox sys: 125 cm/s
Right cca dist sys: -92 cm/s

## 2017-04-28 ENCOUNTER — Telehealth: Payer: Self-pay | Admitting: *Deleted

## 2017-04-28 NOTE — Telephone Encounter (Signed)
Notes recorded by Lesle ChrisHill, Alaia Lordi G, LPN on 1/6/10961/11/2017 at 10:12 AM EST Patient notified. Copy to pmd. ------  Notes recorded by Lesle ChrisHill, Romulus Hanrahan G, LPN on 0/4/54091/11/2017 at 9:53 AM EST Left message to return call.  ------  Notes recorded by Laqueta LindenKoneswaran, Suresh A, MD on 04/27/2017 at 11:10 AM EST Very mild blockages in both carotid arteries. Can be repeated in 3 years.

## 2017-05-05 ENCOUNTER — Telehealth: Payer: Self-pay | Admitting: Cardiovascular Disease

## 2017-05-05 NOTE — Telephone Encounter (Signed)
Will send to Dr. Koneswarans nurse  

## 2017-05-05 NOTE — Telephone Encounter (Signed)
Would like to know if paperwork was sent back to his doctor so he can return to work

## 2017-05-06 NOTE — Telephone Encounter (Signed)
Returned call

## 2017-05-06 NOTE — Telephone Encounter (Signed)
Left message to return call 

## 2017-05-06 NOTE — Telephone Encounter (Signed)
Informed patient that the occupational health form has already been faxed back.  He verbalized understanding.

## 2017-05-07 DIAGNOSIS — N521 Erectile dysfunction due to diseases classified elsewhere: Secondary | ICD-10-CM | POA: Insufficient documentation

## 2017-05-07 DIAGNOSIS — R3912 Poor urinary stream: Secondary | ICD-10-CM | POA: Diagnosis not present

## 2017-05-07 DIAGNOSIS — N401 Enlarged prostate with lower urinary tract symptoms: Secondary | ICD-10-CM | POA: Diagnosis not present

## 2017-05-07 DIAGNOSIS — N3289 Other specified disorders of bladder: Secondary | ICD-10-CM | POA: Insufficient documentation

## 2017-05-07 DIAGNOSIS — I252 Old myocardial infarction: Secondary | ICD-10-CM | POA: Diagnosis not present

## 2017-05-07 DIAGNOSIS — N99114 Postprocedural urethral stricture, male, unspecified: Secondary | ICD-10-CM | POA: Diagnosis not present

## 2017-05-07 DIAGNOSIS — R351 Nocturia: Secondary | ICD-10-CM | POA: Diagnosis not present

## 2017-05-18 ENCOUNTER — Ambulatory Visit: Payer: Medicare HMO | Admitting: Cardiovascular Disease

## 2017-05-18 DIAGNOSIS — I2510 Atherosclerotic heart disease of native coronary artery without angina pectoris: Secondary | ICD-10-CM | POA: Diagnosis not present

## 2017-05-18 DIAGNOSIS — I10 Essential (primary) hypertension: Secondary | ICD-10-CM | POA: Diagnosis not present

## 2017-05-25 DIAGNOSIS — N35011 Post-traumatic bulbous urethral stricture: Secondary | ICD-10-CM | POA: Diagnosis not present

## 2017-05-25 DIAGNOSIS — R300 Dysuria: Secondary | ICD-10-CM | POA: Diagnosis not present

## 2017-05-25 DIAGNOSIS — N401 Enlarged prostate with lower urinary tract symptoms: Secondary | ICD-10-CM | POA: Diagnosis not present

## 2017-05-25 DIAGNOSIS — R351 Nocturia: Secondary | ICD-10-CM | POA: Diagnosis not present

## 2017-05-25 DIAGNOSIS — N5201 Erectile dysfunction due to arterial insufficiency: Secondary | ICD-10-CM | POA: Diagnosis not present

## 2017-06-03 DIAGNOSIS — I2510 Atherosclerotic heart disease of native coronary artery without angina pectoris: Secondary | ICD-10-CM | POA: Diagnosis not present

## 2017-06-03 DIAGNOSIS — I10 Essential (primary) hypertension: Secondary | ICD-10-CM | POA: Diagnosis not present

## 2017-07-01 DIAGNOSIS — I2510 Atherosclerotic heart disease of native coronary artery without angina pectoris: Secondary | ICD-10-CM | POA: Diagnosis not present

## 2017-07-01 DIAGNOSIS — I10 Essential (primary) hypertension: Secondary | ICD-10-CM | POA: Diagnosis not present

## 2017-07-02 ENCOUNTER — Telehealth: Payer: Self-pay | Admitting: Cardiovascular Disease

## 2017-07-02 ENCOUNTER — Telehealth: Payer: Self-pay | Admitting: *Deleted

## 2017-07-02 NOTE — Telephone Encounter (Signed)
Dr. Brion Alimenthouhan called r/e request about holding aspirin. Patient urology/surgical procedure is scheduled for 07/13/17 and will have to be canceled if patient is not allowed to hold aspirin 10 days prior.   Fax # [phone]

## 2017-07-02 NOTE — Telephone Encounter (Signed)
Faxed to Dr. Lia Hoppinghouhan's office.

## 2017-07-02 NOTE — Telephone Encounter (Signed)
Pre-cert Verification for the following procedure   [facility]

## 2017-07-02 NOTE — Telephone Encounter (Signed)
That is ok, but 5-7 days should be sufficient.

## 2017-07-07 ENCOUNTER — Ambulatory Visit (HOSPITAL_COMMUNITY)
Admission: RE | Admit: 2017-07-07 | Discharge: 2017-07-07 | Disposition: A | Discharge status: Home / Self Care | Payer: Medicare HMO | Source: Ambulatory Visit | Attending: Cardiovascular Disease | Admitting: Cardiovascular Disease

## 2017-07-07 DIAGNOSIS — I25700 Atherosclerosis of coronary artery bypass graft(s), unspecified, with unstable angina pectoris: Secondary | ICD-10-CM | POA: Diagnosis not present

## 2017-07-07 LAB — EXERCISE TOLERANCE TEST
CHL CUP RESTING HR STRESS: 84 {beats}/min
CHL RATE OF PERCEIVED EXERTION: 13
CSEPEW: 4.6 METS
CSEPPHR: 139 {beats}/min
Exercise duration (min): 3 min
Exercise duration (sec): 1 s
MPHR: 144 {beats}/min
Percent HR: 96 %

## 2017-07-08 ENCOUNTER — Telehealth: Payer: Self-pay | Admitting: *Deleted

## 2017-07-08 NOTE — Telephone Encounter (Signed)
-----   Message from Laqueta LindenSuresh A Koneswaran, MD sent at 07/07/2017  3:07 PM EDT ----- ok

## 2017-07-08 NOTE — Telephone Encounter (Signed)
Called patient with test results. No answer. Left message to call back.  

## 2017-07-09 ENCOUNTER — Telehealth: Payer: Self-pay | Admitting: *Deleted

## 2017-07-09 NOTE — Telephone Encounter (Signed)
Called patient with test results. No answer. Left message to call back.  

## 2017-07-09 NOTE — Telephone Encounter (Signed)
-----   Message from Suresh A Koneswaran, MD sent at 07/07/2017  3:07 PM EDT ----- ok 

## 2017-07-10 DIAGNOSIS — N521 Erectile dysfunction due to diseases classified elsewhere: Secondary | ICD-10-CM | POA: Diagnosis not present

## 2017-07-10 DIAGNOSIS — Z01818 Encounter for other preprocedural examination: Secondary | ICD-10-CM | POA: Diagnosis not present

## 2017-07-10 DIAGNOSIS — R3912 Poor urinary stream: Secondary | ICD-10-CM | POA: Diagnosis not present

## 2017-07-10 DIAGNOSIS — R351 Nocturia: Secondary | ICD-10-CM | POA: Diagnosis not present

## 2017-07-10 DIAGNOSIS — I2510 Atherosclerotic heart disease of native coronary artery without angina pectoris: Secondary | ICD-10-CM | POA: Diagnosis not present

## 2017-07-10 DIAGNOSIS — N99116 Postprocedural urethral stricture, male, overlapping sites: Secondary | ICD-10-CM | POA: Diagnosis not present

## 2017-07-10 DIAGNOSIS — N99114 Postprocedural urethral stricture, male, unspecified: Secondary | ICD-10-CM | POA: Diagnosis not present

## 2017-07-10 DIAGNOSIS — I6521 Occlusion and stenosis of right carotid artery: Secondary | ICD-10-CM | POA: Diagnosis not present

## 2017-07-10 DIAGNOSIS — Z951 Presence of aortocoronary bypass graft: Secondary | ICD-10-CM | POA: Diagnosis not present

## 2017-07-10 DIAGNOSIS — I10 Essential (primary) hypertension: Secondary | ICD-10-CM | POA: Diagnosis not present

## 2017-07-13 ENCOUNTER — Telehealth: Payer: Self-pay | Admitting: Cardiovascular Disease

## 2017-07-13 ENCOUNTER — Telehealth: Payer: Self-pay | Admitting: *Deleted

## 2017-07-13 NOTE — Telephone Encounter (Signed)
Patient informed that stress test result has been sent to Dr. Pernell DupreAdams' office.

## 2017-07-13 NOTE — Telephone Encounter (Signed)
[name] called requesting his test results.

## 2017-07-13 NOTE — Telephone Encounter (Signed)
Requesting that stress test result be sen to Dr. Pernell DupreAdams. Results sent.

## 2017-07-14 ENCOUNTER — Telehealth: Payer: Self-pay | Admitting: Cardiovascular Disease

## 2017-07-14 NOTE — Telephone Encounter (Signed)
Called wanting results of stress test

## 2017-07-14 NOTE — Telephone Encounter (Signed)
Pt informed test was "ok" per Dr Purvis SheffieldKoneswaran, he had also been given results on 07/09/17, [facility]

## 2017-08-01 DIAGNOSIS — I10 Essential (primary) hypertension: Secondary | ICD-10-CM | POA: Diagnosis not present

## 2017-08-01 DIAGNOSIS — I2510 Atherosclerotic heart disease of native coronary artery without angina pectoris: Secondary | ICD-10-CM | POA: Diagnosis not present

## 2017-08-10 DIAGNOSIS — N401 Enlarged prostate with lower urinary tract symptoms: Secondary | ICD-10-CM | POA: Diagnosis not present

## 2017-08-10 DIAGNOSIS — Z9889 Other specified postprocedural states: Secondary | ICD-10-CM | POA: Diagnosis not present

## 2017-08-10 DIAGNOSIS — N35912 Unspecified bulbous urethral stricture, male: Secondary | ICD-10-CM | POA: Diagnosis not present

## 2017-08-10 DIAGNOSIS — Z7982 Long term (current) use of aspirin: Secondary | ICD-10-CM | POA: Diagnosis not present

## 2017-08-10 DIAGNOSIS — Z951 Presence of aortocoronary bypass graft: Secondary | ICD-10-CM | POA: Diagnosis not present

## 2017-08-10 DIAGNOSIS — E039 Hypothyroidism, unspecified: Secondary | ICD-10-CM | POA: Diagnosis not present

## 2017-08-10 DIAGNOSIS — I252 Old myocardial infarction: Secondary | ICD-10-CM | POA: Diagnosis not present

## 2017-08-10 DIAGNOSIS — R3912 Poor urinary stream: Secondary | ICD-10-CM | POA: Diagnosis not present

## 2017-08-10 DIAGNOSIS — N99114 Postprocedural urethral stricture, male, unspecified: Secondary | ICD-10-CM | POA: Diagnosis not present

## 2017-08-10 DIAGNOSIS — N3289 Other specified disorders of bladder: Secondary | ICD-10-CM | POA: Diagnosis not present

## 2017-08-10 DIAGNOSIS — I2510 Atherosclerotic heart disease of native coronary artery without angina pectoris: Secondary | ICD-10-CM | POA: Diagnosis not present

## 2017-08-10 DIAGNOSIS — R351 Nocturia: Secondary | ICD-10-CM | POA: Diagnosis not present

## 2017-08-10 DIAGNOSIS — N368 Other specified disorders of urethra: Secondary | ICD-10-CM | POA: Diagnosis not present

## 2017-08-10 DIAGNOSIS — I10 Essential (primary) hypertension: Secondary | ICD-10-CM | POA: Diagnosis not present

## 2017-08-11 DIAGNOSIS — I2510 Atherosclerotic heart disease of native coronary artery without angina pectoris: Secondary | ICD-10-CM | POA: Diagnosis not present

## 2017-08-11 DIAGNOSIS — N3289 Other specified disorders of bladder: Secondary | ICD-10-CM | POA: Diagnosis not present

## 2017-08-11 DIAGNOSIS — I10 Essential (primary) hypertension: Secondary | ICD-10-CM | POA: Diagnosis not present

## 2017-08-11 DIAGNOSIS — N35912 Unspecified bulbous urethral stricture, male: Secondary | ICD-10-CM | POA: Diagnosis not present

## 2017-08-11 DIAGNOSIS — I252 Old myocardial infarction: Secondary | ICD-10-CM | POA: Diagnosis not present

## 2017-08-11 DIAGNOSIS — Z7982 Long term (current) use of aspirin: Secondary | ICD-10-CM | POA: Diagnosis not present

## 2017-08-11 DIAGNOSIS — Z951 Presence of aortocoronary bypass graft: Secondary | ICD-10-CM | POA: Diagnosis not present

## 2017-08-11 DIAGNOSIS — E039 Hypothyroidism, unspecified: Secondary | ICD-10-CM | POA: Diagnosis not present

## 2017-08-31 DIAGNOSIS — I2510 Atherosclerotic heart disease of native coronary artery without angina pectoris: Secondary | ICD-10-CM | POA: Diagnosis not present

## 2017-08-31 DIAGNOSIS — I10 Essential (primary) hypertension: Secondary | ICD-10-CM | POA: Diagnosis not present

## 2017-09-04 DIAGNOSIS — R3912 Poor urinary stream: Secondary | ICD-10-CM | POA: Diagnosis not present

## 2017-09-04 DIAGNOSIS — N401 Enlarged prostate with lower urinary tract symptoms: Secondary | ICD-10-CM | POA: Diagnosis not present

## 2017-09-04 DIAGNOSIS — N521 Erectile dysfunction due to diseases classified elsewhere: Secondary | ICD-10-CM | POA: Diagnosis not present

## 2017-09-04 DIAGNOSIS — N400 Enlarged prostate without lower urinary tract symptoms: Secondary | ICD-10-CM | POA: Diagnosis not present

## 2017-09-04 DIAGNOSIS — I252 Old myocardial infarction: Secondary | ICD-10-CM | POA: Diagnosis not present

## 2017-09-04 DIAGNOSIS — N99114 Postprocedural urethral stricture, male, unspecified: Secondary | ICD-10-CM | POA: Diagnosis not present

## 2017-09-04 DIAGNOSIS — N3289 Other specified disorders of bladder: Secondary | ICD-10-CM | POA: Diagnosis not present

## 2017-09-22 DIAGNOSIS — Z87448 Personal history of other diseases of urinary system: Secondary | ICD-10-CM | POA: Diagnosis not present

## 2017-09-22 DIAGNOSIS — N99114 Postprocedural urethral stricture, male, unspecified: Secondary | ICD-10-CM | POA: Diagnosis not present

## 2017-09-22 DIAGNOSIS — Z09 Encounter for follow-up examination after completed treatment for conditions other than malignant neoplasm: Secondary | ICD-10-CM | POA: Diagnosis not present

## 2017-09-24 DIAGNOSIS — E7849 Other hyperlipidemia: Secondary | ICD-10-CM | POA: Diagnosis not present

## 2017-09-24 DIAGNOSIS — I10 Essential (primary) hypertension: Secondary | ICD-10-CM | POA: Diagnosis not present

## 2017-09-24 DIAGNOSIS — I2510 Atherosclerotic heart disease of native coronary artery without angina pectoris: Secondary | ICD-10-CM | POA: Diagnosis not present

## 2017-09-24 DIAGNOSIS — E039 Hypothyroidism, unspecified: Secondary | ICD-10-CM | POA: Diagnosis not present

## 2017-10-24 DIAGNOSIS — I2510 Atherosclerotic heart disease of native coronary artery without angina pectoris: Secondary | ICD-10-CM | POA: Diagnosis not present

## 2017-10-24 DIAGNOSIS — I10 Essential (primary) hypertension: Secondary | ICD-10-CM | POA: Diagnosis not present

## 2017-11-06 DIAGNOSIS — N3289 Other specified disorders of bladder: Secondary | ICD-10-CM | POA: Diagnosis not present

## 2017-11-06 DIAGNOSIS — Z87448 Personal history of other diseases of urinary system: Secondary | ICD-10-CM | POA: Diagnosis not present

## 2017-11-24 DIAGNOSIS — I10 Essential (primary) hypertension: Secondary | ICD-10-CM | POA: Diagnosis not present

## 2017-11-24 DIAGNOSIS — I2510 Atherosclerotic heart disease of native coronary artery without angina pectoris: Secondary | ICD-10-CM | POA: Diagnosis not present

## 2017-12-24 DIAGNOSIS — R195 Other fecal abnormalities: Secondary | ICD-10-CM | POA: Diagnosis not present

## 2017-12-24 DIAGNOSIS — K5900 Constipation, unspecified: Secondary | ICD-10-CM | POA: Diagnosis not present

## 2017-12-24 DIAGNOSIS — R21 Rash and other nonspecific skin eruption: Secondary | ICD-10-CM | POA: Diagnosis not present

## 2017-12-24 DIAGNOSIS — Z7982 Long term (current) use of aspirin: Secondary | ICD-10-CM | POA: Diagnosis not present

## 2017-12-24 DIAGNOSIS — Z79890 Hormone replacement therapy (postmenopausal): Secondary | ICD-10-CM | POA: Diagnosis not present

## 2018-01-24 DIAGNOSIS — I10 Essential (primary) hypertension: Secondary | ICD-10-CM | POA: Diagnosis not present

## 2018-01-24 DIAGNOSIS — I2510 Atherosclerotic heart disease of native coronary artery without angina pectoris: Secondary | ICD-10-CM | POA: Diagnosis not present

## 2018-02-12 DIAGNOSIS — N401 Enlarged prostate with lower urinary tract symptoms: Secondary | ICD-10-CM | POA: Diagnosis not present

## 2018-02-12 DIAGNOSIS — N529 Male erectile dysfunction, unspecified: Secondary | ICD-10-CM | POA: Diagnosis not present

## 2018-02-12 DIAGNOSIS — R32 Unspecified urinary incontinence: Secondary | ICD-10-CM | POA: Diagnosis not present

## 2018-02-12 DIAGNOSIS — N99114 Postprocedural urethral stricture, male, unspecified: Secondary | ICD-10-CM | POA: Diagnosis not present

## 2018-02-12 DIAGNOSIS — R351 Nocturia: Secondary | ICD-10-CM | POA: Diagnosis not present

## 2018-02-24 DIAGNOSIS — I2510 Atherosclerotic heart disease of native coronary artery without angina pectoris: Secondary | ICD-10-CM | POA: Diagnosis not present

## 2018-02-24 DIAGNOSIS — I10 Essential (primary) hypertension: Secondary | ICD-10-CM | POA: Diagnosis not present

## 2018-03-23 DIAGNOSIS — R739 Hyperglycemia, unspecified: Secondary | ICD-10-CM | POA: Diagnosis not present

## 2018-03-23 DIAGNOSIS — I10 Essential (primary) hypertension: Secondary | ICD-10-CM | POA: Diagnosis not present

## 2018-03-23 DIAGNOSIS — Z0000 Encounter for general adult medical examination without abnormal findings: Secondary | ICD-10-CM | POA: Diagnosis not present

## 2018-03-23 DIAGNOSIS — Z1331 Encounter for screening for depression: Secondary | ICD-10-CM | POA: Diagnosis not present

## 2018-03-23 DIAGNOSIS — E7849 Other hyperlipidemia: Secondary | ICD-10-CM | POA: Diagnosis not present

## 2018-03-23 DIAGNOSIS — Z0001 Encounter for general adult medical examination with abnormal findings: Secondary | ICD-10-CM | POA: Diagnosis not present

## 2018-03-23 DIAGNOSIS — Z23 Encounter for immunization: Secondary | ICD-10-CM | POA: Diagnosis not present

## 2018-03-23 DIAGNOSIS — T82898A Other specified complication of vascular prosthetic devices, implants and grafts, initial encounter: Secondary | ICD-10-CM | POA: Diagnosis not present

## 2018-03-23 DIAGNOSIS — N400 Enlarged prostate without lower urinary tract symptoms: Secondary | ICD-10-CM | POA: Diagnosis not present

## 2018-03-23 DIAGNOSIS — E039 Hypothyroidism, unspecified: Secondary | ICD-10-CM | POA: Diagnosis not present

## 2018-03-23 DIAGNOSIS — I2510 Atherosclerotic heart disease of native coronary artery without angina pectoris: Secondary | ICD-10-CM | POA: Diagnosis not present

## 2018-03-23 DIAGNOSIS — Z1389 Encounter for screening for other disorder: Secondary | ICD-10-CM | POA: Diagnosis not present

## 2018-05-24 DIAGNOSIS — I10 Essential (primary) hypertension: Secondary | ICD-10-CM | POA: Diagnosis not present

## 2018-05-24 DIAGNOSIS — I2510 Atherosclerotic heart disease of native coronary artery without angina pectoris: Secondary | ICD-10-CM | POA: Diagnosis not present

## 2018-06-22 DIAGNOSIS — I2510 Atherosclerotic heart disease of native coronary artery without angina pectoris: Secondary | ICD-10-CM | POA: Diagnosis not present

## 2018-06-22 DIAGNOSIS — I10 Essential (primary) hypertension: Secondary | ICD-10-CM | POA: Diagnosis not present

## 2018-07-06 ENCOUNTER — Telehealth: Payer: Self-pay | Admitting: Cardiology

## 2018-07-06 NOTE — Telephone Encounter (Signed)
Did not need this encounter °

## 2018-07-23 DIAGNOSIS — I10 Essential (primary) hypertension: Secondary | ICD-10-CM | POA: Diagnosis not present

## 2018-07-23 DIAGNOSIS — I2510 Atherosclerotic heart disease of native coronary artery without angina pectoris: Secondary | ICD-10-CM | POA: Diagnosis not present

## 2018-08-22 DIAGNOSIS — I2510 Atherosclerotic heart disease of native coronary artery without angina pectoris: Secondary | ICD-10-CM | POA: Diagnosis not present

## 2018-08-22 DIAGNOSIS — I10 Essential (primary) hypertension: Secondary | ICD-10-CM | POA: Diagnosis not present

## 2018-10-12 DIAGNOSIS — N400 Enlarged prostate without lower urinary tract symptoms: Secondary | ICD-10-CM | POA: Diagnosis not present

## 2018-10-12 DIAGNOSIS — E785 Hyperlipidemia, unspecified: Secondary | ICD-10-CM | POA: Diagnosis not present

## 2018-10-12 DIAGNOSIS — I10 Essential (primary) hypertension: Secondary | ICD-10-CM | POA: Diagnosis not present

## 2018-10-12 DIAGNOSIS — I2510 Atherosclerotic heart disease of native coronary artery without angina pectoris: Secondary | ICD-10-CM | POA: Diagnosis not present

## 2018-11-04 DIAGNOSIS — R32 Unspecified urinary incontinence: Secondary | ICD-10-CM | POA: Diagnosis not present

## 2018-11-04 DIAGNOSIS — N521 Erectile dysfunction due to diseases classified elsewhere: Secondary | ICD-10-CM | POA: Diagnosis not present

## 2018-11-04 DIAGNOSIS — N401 Enlarged prostate with lower urinary tract symptoms: Secondary | ICD-10-CM | POA: Diagnosis not present

## 2018-11-04 DIAGNOSIS — N99114 Postprocedural urethral stricture, male, unspecified: Secondary | ICD-10-CM | POA: Diagnosis not present

## 2018-11-11 DIAGNOSIS — I2510 Atherosclerotic heart disease of native coronary artery without angina pectoris: Secondary | ICD-10-CM | POA: Diagnosis not present

## 2018-11-11 DIAGNOSIS — I10 Essential (primary) hypertension: Secondary | ICD-10-CM | POA: Diagnosis not present

## 2018-12-02 DIAGNOSIS — R3912 Poor urinary stream: Secondary | ICD-10-CM | POA: Diagnosis not present

## 2018-12-02 DIAGNOSIS — N99114 Postprocedural urethral stricture, male, unspecified: Secondary | ICD-10-CM | POA: Diagnosis not present

## 2018-12-09 DIAGNOSIS — N99114 Postprocedural urethral stricture, male, unspecified: Secondary | ICD-10-CM | POA: Diagnosis not present

## 2018-12-09 DIAGNOSIS — N528 Other male erectile dysfunction: Secondary | ICD-10-CM | POA: Diagnosis not present

## 2018-12-09 DIAGNOSIS — N3289 Other specified disorders of bladder: Secondary | ICD-10-CM | POA: Diagnosis not present

## 2018-12-09 DIAGNOSIS — R32 Unspecified urinary incontinence: Secondary | ICD-10-CM | POA: Diagnosis not present

## 2018-12-12 DIAGNOSIS — I2510 Atherosclerotic heart disease of native coronary artery without angina pectoris: Secondary | ICD-10-CM | POA: Diagnosis not present

## 2018-12-12 DIAGNOSIS — I10 Essential (primary) hypertension: Secondary | ICD-10-CM | POA: Diagnosis not present

## 2019-01-12 DIAGNOSIS — I10 Essential (primary) hypertension: Secondary | ICD-10-CM | POA: Diagnosis not present

## 2019-01-12 DIAGNOSIS — I2510 Atherosclerotic heart disease of native coronary artery without angina pectoris: Secondary | ICD-10-CM | POA: Diagnosis not present

## 2019-01-18 ENCOUNTER — Ambulatory Visit: Payer: Medicare HMO | Admitting: Cardiovascular Disease

## 2019-01-19 ENCOUNTER — Encounter: Payer: Self-pay | Admitting: Cardiovascular Disease

## 2019-02-11 DIAGNOSIS — I10 Essential (primary) hypertension: Secondary | ICD-10-CM | POA: Diagnosis not present

## 2019-02-11 DIAGNOSIS — I2510 Atherosclerotic heart disease of native coronary artery without angina pectoris: Secondary | ICD-10-CM | POA: Diagnosis not present

## 2019-03-14 DIAGNOSIS — I2510 Atherosclerotic heart disease of native coronary artery without angina pectoris: Secondary | ICD-10-CM | POA: Diagnosis not present

## 2019-03-14 DIAGNOSIS — I10 Essential (primary) hypertension: Secondary | ICD-10-CM | POA: Diagnosis not present

## 2019-04-13 DIAGNOSIS — E785 Hyperlipidemia, unspecified: Secondary | ICD-10-CM | POA: Diagnosis not present

## 2019-04-13 DIAGNOSIS — I2510 Atherosclerotic heart disease of native coronary artery without angina pectoris: Secondary | ICD-10-CM | POA: Diagnosis not present

## 2019-05-14 DIAGNOSIS — I2510 Atherosclerotic heart disease of native coronary artery without angina pectoris: Secondary | ICD-10-CM | POA: Diagnosis not present

## 2019-05-14 DIAGNOSIS — E785 Hyperlipidemia, unspecified: Secondary | ICD-10-CM | POA: Diagnosis not present

## 2019-05-31 DIAGNOSIS — Z1331 Encounter for screening for depression: Secondary | ICD-10-CM | POA: Diagnosis not present

## 2019-05-31 DIAGNOSIS — I10 Essential (primary) hypertension: Secondary | ICD-10-CM | POA: Diagnosis not present

## 2019-05-31 DIAGNOSIS — Z1389 Encounter for screening for other disorder: Secondary | ICD-10-CM | POA: Diagnosis not present

## 2019-05-31 DIAGNOSIS — Z0001 Encounter for general adult medical examination with abnormal findings: Secondary | ICD-10-CM | POA: Diagnosis not present

## 2019-05-31 DIAGNOSIS — N400 Enlarged prostate without lower urinary tract symptoms: Secondary | ICD-10-CM | POA: Diagnosis not present

## 2019-05-31 DIAGNOSIS — E785 Hyperlipidemia, unspecified: Secondary | ICD-10-CM | POA: Diagnosis not present

## 2019-06-27 DIAGNOSIS — I10 Essential (primary) hypertension: Secondary | ICD-10-CM | POA: Diagnosis not present

## 2019-06-27 DIAGNOSIS — E785 Hyperlipidemia, unspecified: Secondary | ICD-10-CM | POA: Diagnosis not present

## 2019-06-27 DIAGNOSIS — I2510 Atherosclerotic heart disease of native coronary artery without angina pectoris: Secondary | ICD-10-CM | POA: Diagnosis not present

## 2019-06-27 DIAGNOSIS — N400 Enlarged prostate without lower urinary tract symptoms: Secondary | ICD-10-CM | POA: Diagnosis not present

## 2019-06-28 DIAGNOSIS — I2510 Atherosclerotic heart disease of native coronary artery without angina pectoris: Secondary | ICD-10-CM | POA: Diagnosis not present

## 2019-06-28 DIAGNOSIS — N390 Urinary tract infection, site not specified: Secondary | ICD-10-CM | POA: Diagnosis not present

## 2019-06-28 DIAGNOSIS — E785 Hyperlipidemia, unspecified: Secondary | ICD-10-CM | POA: Diagnosis not present

## 2019-06-28 DIAGNOSIS — R339 Retention of urine, unspecified: Secondary | ICD-10-CM | POA: Diagnosis not present

## 2019-06-28 DIAGNOSIS — I10 Essential (primary) hypertension: Secondary | ICD-10-CM | POA: Diagnosis not present

## 2019-06-28 DIAGNOSIS — N400 Enlarged prostate without lower urinary tract symptoms: Secondary | ICD-10-CM | POA: Diagnosis not present

## 2019-06-30 DIAGNOSIS — R351 Nocturia: Secondary | ICD-10-CM | POA: Diagnosis not present

## 2019-06-30 DIAGNOSIS — K4090 Unilateral inguinal hernia, without obstruction or gangrene, not specified as recurrent: Secondary | ICD-10-CM | POA: Diagnosis not present

## 2019-06-30 DIAGNOSIS — N401 Enlarged prostate with lower urinary tract symptoms: Secondary | ICD-10-CM | POA: Diagnosis not present

## 2019-07-15 DIAGNOSIS — R351 Nocturia: Secondary | ICD-10-CM | POA: Diagnosis not present

## 2019-07-15 DIAGNOSIS — R311 Benign essential microscopic hematuria: Secondary | ICD-10-CM | POA: Diagnosis not present

## 2019-07-15 DIAGNOSIS — N401 Enlarged prostate with lower urinary tract symptoms: Secondary | ICD-10-CM | POA: Diagnosis not present

## 2019-07-29 DIAGNOSIS — Z125 Encounter for screening for malignant neoplasm of prostate: Secondary | ICD-10-CM | POA: Diagnosis not present

## 2019-07-29 DIAGNOSIS — R351 Nocturia: Secondary | ICD-10-CM | POA: Diagnosis not present

## 2019-07-29 DIAGNOSIS — N401 Enlarged prostate with lower urinary tract symptoms: Secondary | ICD-10-CM | POA: Diagnosis not present

## 2019-07-29 DIAGNOSIS — N3289 Other specified disorders of bladder: Secondary | ICD-10-CM | POA: Diagnosis not present

## 2019-07-29 DIAGNOSIS — E785 Hyperlipidemia, unspecified: Secondary | ICD-10-CM | POA: Diagnosis not present

## 2019-07-29 DIAGNOSIS — I2510 Atherosclerotic heart disease of native coronary artery without angina pectoris: Secondary | ICD-10-CM | POA: Diagnosis not present

## 2019-07-29 DIAGNOSIS — R3912 Poor urinary stream: Secondary | ICD-10-CM | POA: Diagnosis not present

## 2019-08-02 DIAGNOSIS — N35011 Post-traumatic bulbous urethral stricture: Secondary | ICD-10-CM | POA: Diagnosis not present

## 2019-08-02 DIAGNOSIS — R351 Nocturia: Secondary | ICD-10-CM | POA: Diagnosis not present

## 2019-08-18 DIAGNOSIS — K4090 Unilateral inguinal hernia, without obstruction or gangrene, not specified as recurrent: Secondary | ICD-10-CM | POA: Diagnosis not present

## 2019-08-18 DIAGNOSIS — Z87891 Personal history of nicotine dependence: Secondary | ICD-10-CM | POA: Diagnosis not present

## 2019-08-18 DIAGNOSIS — N401 Enlarged prostate with lower urinary tract symptoms: Secondary | ICD-10-CM | POA: Diagnosis not present

## 2019-08-18 DIAGNOSIS — I252 Old myocardial infarction: Secondary | ICD-10-CM | POA: Diagnosis not present

## 2019-08-18 DIAGNOSIS — Z79899 Other long term (current) drug therapy: Secondary | ICD-10-CM | POA: Diagnosis not present

## 2019-08-18 DIAGNOSIS — N4889 Other specified disorders of penis: Secondary | ICD-10-CM | POA: Diagnosis not present

## 2019-08-28 DIAGNOSIS — I10 Essential (primary) hypertension: Secondary | ICD-10-CM | POA: Diagnosis not present

## 2019-08-28 DIAGNOSIS — I2510 Atherosclerotic heart disease of native coronary artery without angina pectoris: Secondary | ICD-10-CM | POA: Diagnosis not present

## 2019-08-30 DIAGNOSIS — R32 Unspecified urinary incontinence: Secondary | ICD-10-CM | POA: Diagnosis not present

## 2019-08-30 DIAGNOSIS — R351 Nocturia: Secondary | ICD-10-CM | POA: Diagnosis not present

## 2019-08-30 DIAGNOSIS — N99114 Postprocedural urethral stricture, male, unspecified: Secondary | ICD-10-CM | POA: Diagnosis not present

## 2019-08-30 DIAGNOSIS — N401 Enlarged prostate with lower urinary tract symptoms: Secondary | ICD-10-CM | POA: Diagnosis not present

## 2019-08-30 DIAGNOSIS — R3912 Poor urinary stream: Secondary | ICD-10-CM | POA: Diagnosis not present

## 2019-08-30 DIAGNOSIS — N3289 Other specified disorders of bladder: Secondary | ICD-10-CM | POA: Diagnosis not present

## 2019-09-28 DIAGNOSIS — I2510 Atherosclerotic heart disease of native coronary artery without angina pectoris: Secondary | ICD-10-CM | POA: Diagnosis not present

## 2019-09-28 DIAGNOSIS — I10 Essential (primary) hypertension: Secondary | ICD-10-CM | POA: Diagnosis not present

## 2019-10-28 DIAGNOSIS — I10 Essential (primary) hypertension: Secondary | ICD-10-CM | POA: Diagnosis not present

## 2019-10-28 DIAGNOSIS — E785 Hyperlipidemia, unspecified: Secondary | ICD-10-CM | POA: Diagnosis not present

## 2019-12-08 DIAGNOSIS — I10 Essential (primary) hypertension: Secondary | ICD-10-CM | POA: Diagnosis not present

## 2019-12-08 DIAGNOSIS — I2510 Atherosclerotic heart disease of native coronary artery without angina pectoris: Secondary | ICD-10-CM | POA: Diagnosis not present

## 2019-12-08 DIAGNOSIS — N400 Enlarged prostate without lower urinary tract symptoms: Secondary | ICD-10-CM | POA: Diagnosis not present

## 2019-12-08 DIAGNOSIS — E785 Hyperlipidemia, unspecified: Secondary | ICD-10-CM | POA: Diagnosis not present

## 2020-01-08 DIAGNOSIS — I10 Essential (primary) hypertension: Secondary | ICD-10-CM | POA: Diagnosis not present

## 2020-01-08 DIAGNOSIS — I2510 Atherosclerotic heart disease of native coronary artery without angina pectoris: Secondary | ICD-10-CM | POA: Diagnosis not present

## 2020-02-07 DIAGNOSIS — I10 Essential (primary) hypertension: Secondary | ICD-10-CM | POA: Diagnosis not present

## 2020-02-07 DIAGNOSIS — I2510 Atherosclerotic heart disease of native coronary artery without angina pectoris: Secondary | ICD-10-CM | POA: Diagnosis not present

## 2020-03-15 DIAGNOSIS — I10 Essential (primary) hypertension: Secondary | ICD-10-CM | POA: Diagnosis not present

## 2020-03-15 DIAGNOSIS — E785 Hyperlipidemia, unspecified: Secondary | ICD-10-CM | POA: Diagnosis not present

## 2020-04-14 DIAGNOSIS — I2510 Atherosclerotic heart disease of native coronary artery without angina pectoris: Secondary | ICD-10-CM | POA: Diagnosis not present

## 2020-04-14 DIAGNOSIS — I10 Essential (primary) hypertension: Secondary | ICD-10-CM | POA: Diagnosis not present

## 2020-05-15 DIAGNOSIS — I2510 Atherosclerotic heart disease of native coronary artery without angina pectoris: Secondary | ICD-10-CM | POA: Diagnosis not present

## 2020-05-15 DIAGNOSIS — I10 Essential (primary) hypertension: Secondary | ICD-10-CM | POA: Diagnosis not present

## 2020-06-07 DIAGNOSIS — E039 Hypothyroidism, unspecified: Secondary | ICD-10-CM | POA: Diagnosis not present

## 2020-06-07 DIAGNOSIS — Z0001 Encounter for general adult medical examination with abnormal findings: Secondary | ICD-10-CM | POA: Diagnosis not present

## 2020-06-07 DIAGNOSIS — Z1389 Encounter for screening for other disorder: Secondary | ICD-10-CM | POA: Diagnosis not present

## 2020-06-07 DIAGNOSIS — Z1331 Encounter for screening for depression: Secondary | ICD-10-CM | POA: Diagnosis not present

## 2020-06-07 DIAGNOSIS — I10 Essential (primary) hypertension: Secondary | ICD-10-CM | POA: Diagnosis not present

## 2020-06-07 DIAGNOSIS — I2510 Atherosclerotic heart disease of native coronary artery without angina pectoris: Secondary | ICD-10-CM | POA: Diagnosis not present

## 2020-06-12 DIAGNOSIS — E785 Hyperlipidemia, unspecified: Secondary | ICD-10-CM | POA: Diagnosis not present

## 2020-07-05 DIAGNOSIS — I10 Essential (primary) hypertension: Secondary | ICD-10-CM | POA: Diagnosis not present

## 2020-07-05 DIAGNOSIS — I2510 Atherosclerotic heart disease of native coronary artery without angina pectoris: Secondary | ICD-10-CM | POA: Diagnosis not present

## 2020-07-18 DIAGNOSIS — Z0100 Encounter for examination of eyes and vision without abnormal findings: Secondary | ICD-10-CM | POA: Diagnosis not present

## 2020-07-20 DIAGNOSIS — H524 Presbyopia: Secondary | ICD-10-CM | POA: Diagnosis not present

## 2020-08-05 DIAGNOSIS — I10 Essential (primary) hypertension: Secondary | ICD-10-CM | POA: Diagnosis not present

## 2020-08-05 DIAGNOSIS — I2510 Atherosclerotic heart disease of native coronary artery without angina pectoris: Secondary | ICD-10-CM | POA: Diagnosis not present

## 2020-08-16 DIAGNOSIS — M79642 Pain in left hand: Secondary | ICD-10-CM | POA: Diagnosis not present

## 2020-08-16 DIAGNOSIS — M79641 Pain in right hand: Secondary | ICD-10-CM | POA: Diagnosis not present

## 2020-09-04 DIAGNOSIS — I2510 Atherosclerotic heart disease of native coronary artery without angina pectoris: Secondary | ICD-10-CM | POA: Diagnosis not present

## 2020-09-04 DIAGNOSIS — I10 Essential (primary) hypertension: Secondary | ICD-10-CM | POA: Diagnosis not present

## 2020-09-04 DIAGNOSIS — N400 Enlarged prostate without lower urinary tract symptoms: Secondary | ICD-10-CM | POA: Diagnosis not present

## 2020-10-05 DIAGNOSIS — I10 Essential (primary) hypertension: Secondary | ICD-10-CM | POA: Diagnosis not present

## 2020-10-05 DIAGNOSIS — N400 Enlarged prostate without lower urinary tract symptoms: Secondary | ICD-10-CM | POA: Diagnosis not present

## 2020-11-04 DIAGNOSIS — I2510 Atherosclerotic heart disease of native coronary artery without angina pectoris: Secondary | ICD-10-CM | POA: Diagnosis not present

## 2020-11-04 DIAGNOSIS — I10 Essential (primary) hypertension: Secondary | ICD-10-CM | POA: Diagnosis not present

## 2020-12-27 DIAGNOSIS — I10 Essential (primary) hypertension: Secondary | ICD-10-CM | POA: Diagnosis not present

## 2020-12-27 DIAGNOSIS — N400 Enlarged prostate without lower urinary tract symptoms: Secondary | ICD-10-CM | POA: Diagnosis not present

## 2020-12-27 DIAGNOSIS — E039 Hypothyroidism, unspecified: Secondary | ICD-10-CM | POA: Diagnosis not present

## 2020-12-27 DIAGNOSIS — Z23 Encounter for immunization: Secondary | ICD-10-CM | POA: Diagnosis not present

## 2020-12-27 DIAGNOSIS — I2510 Atherosclerotic heart disease of native coronary artery without angina pectoris: Secondary | ICD-10-CM | POA: Diagnosis not present

## 2021-01-26 DIAGNOSIS — I10 Essential (primary) hypertension: Secondary | ICD-10-CM | POA: Diagnosis not present

## 2021-01-26 DIAGNOSIS — I2510 Atherosclerotic heart disease of native coronary artery without angina pectoris: Secondary | ICD-10-CM | POA: Diagnosis not present

## 2021-01-26 DIAGNOSIS — N400 Enlarged prostate without lower urinary tract symptoms: Secondary | ICD-10-CM | POA: Diagnosis not present

## 2021-02-26 DIAGNOSIS — I2510 Atherosclerotic heart disease of native coronary artery without angina pectoris: Secondary | ICD-10-CM | POA: Diagnosis not present

## 2021-02-26 DIAGNOSIS — I10 Essential (primary) hypertension: Secondary | ICD-10-CM | POA: Diagnosis not present

## 2021-03-28 DIAGNOSIS — I2510 Atherosclerotic heart disease of native coronary artery without angina pectoris: Secondary | ICD-10-CM | POA: Diagnosis not present

## 2021-03-28 DIAGNOSIS — I10 Essential (primary) hypertension: Secondary | ICD-10-CM | POA: Diagnosis not present

## 2021-04-28 DIAGNOSIS — N400 Enlarged prostate without lower urinary tract symptoms: Secondary | ICD-10-CM | POA: Diagnosis not present

## 2021-04-28 DIAGNOSIS — I10 Essential (primary) hypertension: Secondary | ICD-10-CM | POA: Diagnosis not present

## 2021-05-17 ENCOUNTER — Encounter: Payer: Self-pay | Admitting: Urology

## 2021-05-17 ENCOUNTER — Ambulatory Visit: Payer: Medicare HMO | Admitting: Urology

## 2021-05-17 ENCOUNTER — Other Ambulatory Visit: Payer: Self-pay

## 2021-05-17 VITALS — BP 117/66 | HR 91 | Wt 168.0 lb

## 2021-05-17 DIAGNOSIS — N138 Other obstructive and reflux uropathy: Secondary | ICD-10-CM | POA: Diagnosis not present

## 2021-05-17 DIAGNOSIS — N401 Enlarged prostate with lower urinary tract symptoms: Secondary | ICD-10-CM | POA: Diagnosis not present

## 2021-05-17 DIAGNOSIS — R351 Nocturia: Secondary | ICD-10-CM

## 2021-05-17 DIAGNOSIS — N3000 Acute cystitis without hematuria: Secondary | ICD-10-CM | POA: Diagnosis not present

## 2021-05-17 DIAGNOSIS — N289 Disorder of kidney and ureter, unspecified: Secondary | ICD-10-CM

## 2021-05-17 LAB — URINALYSIS, ROUTINE W REFLEX MICROSCOPIC
Bilirubin, UA: NEGATIVE
Glucose, UA: NEGATIVE
Ketones, UA: NEGATIVE
Nitrite, UA: POSITIVE — AB
Specific Gravity, UA: 1.025 (ref 1.005–1.030)
Urobilinogen, Ur: 0.2 mg/dL (ref 0.2–1.0)
pH, UA: 7 (ref 5.0–7.5)

## 2021-05-17 LAB — MICROSCOPIC EXAMINATION
Renal Epithel, UA: NONE SEEN /hpf
WBC, UA: >30 /hpf — AB (ref 0–5)

## 2021-05-17 LAB — BLADDER SCAN AMB NON-IMAGING: Scan Result: 0

## 2021-05-17 MED ORDER — GEMTESA 75 MG PO TABS: 1.0000 | ORAL_TABLET | Freq: Every day | ORAL | 0 refills | Status: DC

## 2021-05-17 NOTE — Progress Notes (Signed)
05/17/2021 11:41 AM   Clayton Austin Feb 12, 1941 [id number]  Referring provider: Benetta SparFanta, Tesfaye Demissie, MD [address] [place] [id number]  Nocturia and inguinal bulge   HPI: Clayton Austin is a 80yo here for evaluation of nocturia and inguinal bulge. He has  ahx of urethroplasty with Dr. Gillian Shieldserlecki in 2019. He has been doing well after the surgery until 18 months ago when he developed worsening urinary frequency, urgency and nocturia. He has urinary frequency every 1-1.5 hours. Nocturia 5x. He has burning/pain in his glans at night when he has to urinate. He is on flomax 0.4mg  daily, finasteride 5mg  daily, and mirabegron 50mg  daily. IPSS 14 QOL 4. He is bothered by his nocturia and urinary frequency. Urine stream strong. He has noticed an inguinal bulge for over 1 year. The size of the bulge changes daily. No pain. No nausea/vomiting.   PMH: Past Medical History:  Diagnosis Date   Benign prostatic hypertrophy    BPH (benign prostatic hypertrophy)    Coronary artery disease cardiologist-  dr Purvis Sheffieldkoneswaran   a. NSTEMI 07/2012 => critical LM disease on LHC => s/p CABG (L-LAD/D1, S-OM2, S-PDA/PL);  [facility]   Exercise tolerance test normal    Dec 2014  per dr Purvis Sheffieldkoneswaran (cardiologist) note [facility] treadmilll score 5.5 (low risk)   GERD (gastroesophageal reflux disease)    History of atrial fibrillation    post-op  CABG 04/ 2014   History of non-ST elevation myocardial infarction (NSTEMI)    04/ 2014   Hyperlipidemia    Hypertension    Hypothyroidism    Ischemic heart disease    STABLE PER DR Purvis SheffieldKONESWARAN (CARDIOLOGIST) NOTE 06-18-2015   Lower urinary tract symptoms (LUTS)    RBBB (right bundle branch block)    S/P CABG x 5    08-17-2012   LIMA sequentilly to LAD and Diagonal,  SVG to OM,  SVG seq to PDA and PLB with EVH from right thigh   Urethral stricture    recurrent   Wears dentures    UPPER   Wears glasses     Surgical History: Past Surgical  History:  Procedure Laterality Date   CORONARY ARTERY BYPASS GRAFT N/A 08/17/2012   Procedure: CORONARY ARTERY BYPASS GRAFTING (CABG);  Surgeon: Loreli SlotSteven C Hendrickson, MD;  [facility];  Service: Open Heart Surgery;  Laterality: N/A;  L-LAD/D1, S-OM2,  S-PDA/PL   CYSTOSCOPY N/A 08/17/2012   Procedure: CYSTOSCOPY FLEXIBLE for difficult  foley catheter insertion;  Surgeon: Sebastian Acheheodore Manny, MD;  [facility];  Service: Urology;  Laterality: N/A;   CYSTOSCOPY N/A 01/16/2016   Procedure: CYSTOSCOPY FLEXIBLE;  Surgeon: Sebastian Acheheodore Manny, MD;  [facility] [place];  Service: Urology;  Laterality: N/A;   CYSTOSCOPY WITH URETHRAL DILATATION N/A 01/16/2016   Procedure: CYSTOSCOPY WITH URETHRAL BALLOON DILATATION;  Surgeon: Sebastian Acheheodore Manny, MD;  [facility] [place];  Service: Urology;  Laterality: N/A;   LEFT HEART CATHETERIZATION WITH CORONARY ANGIOGRAM N/A 08/16/2012   Procedure: LEFT HEART CATHETERIZATION WITH CORONARY ANGIOGRAM;  Surgeon: Tonny BollmanMichael Cooper, MD;  [facility];  Service: Cardiovascular;  Laterality: N/A;  NSTEMI;  critical LM stenosis extending into the ostial LAD & LCFX,  moderately tight RCA stenosis,  Normal LVF, ef 55-60%   TRANSURETHRAL RESECTION OF PROSTATE  06/2012    Home Medications:  Allergies as of 05/17/2021   No Known Allergies      Medication List        Accurate  as of May 17, 2021 11:41 AM. If you have any questions, ask your nurse or doctor.          STOP taking these medications    losartan 25 MG tablet Commonly known as: COZAAR Stopped by: [name], MD   methylPREDNISolone 4 MG Tbpk tablet Commonly known as: MEDROL DOSEPAK Stopped by: [name], MD   traMADol 50 MG tablet Commonly known as: Ultram Stopped by: [name], MD       TAKE these medications    amLODipine 10 MG tablet Commonly known as: NORVASC Take 10 mg by mouth every morning.   aspirin EC 81 MG tablet Take 1 tablet  (81 mg total) by mouth daily.   atorvastatin 20 MG tablet Commonly known as: LIPITOR Take 20 mg by mouth every morning.   finasteride 5 MG tablet Commonly known as: PROSCAR   levothyroxine 25 MCG tablet Commonly known as: SYNTHROID Take 1 tablet (25 mcg total) by mouth daily before breakfast.   metoprolol tartrate 25 MG tablet Commonly known as: LOPRESSOR Take 1 tablet (25 mg total) by mouth 2 (two) times daily.   omeprazole 20 MG capsule Commonly known as: PRILOSEC Take 20 mg by mouth every morning.   tamsulosin 0.4 MG Caps capsule Commonly known as: FLOMAX Take 0.4 mg by mouth daily after breakfast.        Allergies: No Known Allergies  Family History: Family History  Problem Relation Age of Onset   Coronary artery disease Father 68    Social History:  reports that he quit smoking about 50 years ago. His smoking use included cigarettes. He started smoking about 60 years ago. He has a 5.00 pack-year smoking history. He has never used smokeless tobacco. He reports that he does not drink alcohol and does not use drugs.  ROS: All other review of systems were reviewed and are negative except what is noted above in HPI  Physical Exam: BP 117/66    Pulse 91    Wt 168 lb (76.2 kg)    BMI 24.45 kg/m   Constitutional:  Alert and oriented, No acute distress. HEENT: [place] AT, moist mucus membranes.  Trachea midline, no masses. Cardiovascular: No clubbing, cyanosis, or edema. Respiratory: Normal respiratory effort, no increased work of breathing. GI: Abdomen is soft, nontender, nondistended, no abdominal masses. Right inguinal hernia GU: No CVA tenderness. Uncircumcised phallus. No masses/lesions on penis, testis, scrotum.  Lymph: No cervical or inguinal lymphadenopathy. Skin: No rashes, bruises or suspicious lesions. Neurologic: Grossly intact, no focal deficits, moving all 4 extremities. Psychiatric: Normal mood and affect.  Laboratory Data: Lab Results  Component Value  Date   WBC 16.0 (H) 08/22/2012   HGB 15.3 05/04/2016   HCT 45.0 05/04/2016   MCV 86.6 08/22/2012   PLT 234 08/22/2012    Lab Results  Component Value Date   CREATININE 1.10 05/04/2016    No results found for: PSA  No results found for: TESTOSTERONE  Lab Results  Component Value Date   HGBA1C 5.4 08/17/2012    Urinalysis    Component Value Date/Time   COLORURINE RED (A) 05/04/2016 1053   APPEARANCEUR CLOUDY (A) 05/04/2016 1053   LABSPEC 1.025 05/04/2016 1053   PHURINE 6.0 05/04/2016 1053   GLUCOSEU NEGATIVE 05/04/2016 1053   HGBUR LARGE (A) 05/04/2016 1053   BILIRUBINUR NEGATIVE 05/04/2016 1053   KETONESUR NEGATIVE 05/04/2016 1053   PROTEINUR 100 (A) 05/04/2016 1053   UROBILINOGEN 1.0 08/17/2012 0408   NITRITE NEGATIVE 05/04/2016 1053  LEUKOCYTESUR SMALL (A) 05/04/2016 1053    Lab Results  Component Value Date   BACTERIA FEW (A) 05/04/2016    Pertinent Imaging:  Results for orders placed during the hospital encounter of 08/25/12  DG Abd 1 View  Narrative *RADIOLOGY REPORT*  Clinical Data: Abdominal pain and vomiting.  Elevated white count.  ABDOMEN - 1 VIEW  Comparison: None  Findings: Mildly distended gas filled loops of small bowel are identified within the central abdomen. Stool and gas in the colon and rectum are noted. No suspicious calcifications are identified. Catheter overlying the pelvis is noted. Degenerative changes of the lower lumbar spine and hips noted. No acute bony abnormalities are present.  IMPRESSION: Nonspecific bowel gas pattern - small bowel obstruction is not excluded.  Consider further evaluation with CT as indicated.   Original Report Authenticated By: Harmon Pier, M.D.  No results found for this or any previous visit.  No results found for this or any previous visit.  No results found for this or any previous visit.  No results found for this or any previous visit.  No results found for this or any previous  visit.  No results found for this or any previous visit.  No results found for this or any previous visit.   Assessment & Plan:    1. BPH with nocturia -Continue flomax 0.4mg  daily and finasteride - Urinalysis, Routine w reflex microscopic - BLADDER SCAN AMB NON-IMAGING  2. Urinary frequency -We will trial gemtesa 75mg  daily   No follow-ups on file.  , MD  [facility] [place]

## 2021-05-17 NOTE — Progress Notes (Signed)
post void residual = 0 ml

## 2021-05-17 NOTE — Progress Notes (Signed)
Urological Symptom Review  Patient is experiencing the following symptoms: Frequent urination Hard to postpone urination Burning/pain with urination Get up at night to urinate Leakage of urine Weak stream   Review of Systems  Gastrointestinal (upper)  : Negative for upper GI symptoms  Gastrointestinal (lower) : Negative for lower GI symptoms  Constitutional : Negative for symptoms  Skin: Negative for skin symptoms  Eyes: Negative for eye symptoms  Ear/Nose/Throat : Negative for Ear/Nose/Throat symptoms  Hematologic/Lymphatic: Negative for Hematologic/Lymphatic symptoms  Cardiovascular : Negative for cardiovascular symptoms  Respiratory : Negative for respiratory symptoms  Endocrine: Negative for endocrine symptoms  Musculoskeletal: Negative for musculoskeletal symptoms  Neurological: Negative for neurological symptoms  Psychologic: Negative for psychiatric symptoms

## 2021-05-17 NOTE — Patient Instructions (Signed)

## 2021-05-19 LAB — URINE CULTURE

## 2021-05-21 ENCOUNTER — Other Ambulatory Visit: Payer: Self-pay | Admitting: Physician Assistant

## 2021-05-21 ENCOUNTER — Telehealth: Payer: Self-pay

## 2021-05-21 MED ORDER — SULFAMETHOXAZOLE-TRIMETHOPRIM 800-160 MG PO TABS: 1.0000 | ORAL_TABLET | Freq: Two times a day (BID) | ORAL | 0 refills | Status: DC

## 2021-05-21 NOTE — Telephone Encounter (Signed)
Call pt back at [phone]. Has questions about the Rx called in for him.  Thanks, Rosey Bath

## 2021-05-21 NOTE — Telephone Encounter (Signed)
Returned called. Patient made aware and will go pick up abt.

## 2021-05-21 NOTE — Progress Notes (Signed)
Pt cx >100K Mixed [place]. Per Dr. Ronne Binning, will Rx Bactrim for 10 days.

## 2021-05-21 NOTE — Telephone Encounter (Signed)
-----   Message from Sydnee Levans, [place] sent at 05/21/2021 10:42 AM EST ----- Please let pt know his culture is positive and he needs antibx. I sent Rx for Bactrim to his pharmacy ----- Message ----- From: Ferdinand Lango, RN Sent: 05/20/2021   9:16 AM EST To: Regan Rakers [place], PA-C  Please review

## 2021-06-11 DIAGNOSIS — Z0001 Encounter for general adult medical examination with abnormal findings: Secondary | ICD-10-CM | POA: Diagnosis not present

## 2021-06-11 DIAGNOSIS — I10 Essential (primary) hypertension: Secondary | ICD-10-CM | POA: Diagnosis not present

## 2021-06-11 DIAGNOSIS — Z1331 Encounter for screening for depression: Secondary | ICD-10-CM | POA: Diagnosis not present

## 2021-06-11 DIAGNOSIS — Z1389 Encounter for screening for other disorder: Secondary | ICD-10-CM | POA: Diagnosis not present

## 2021-06-11 DIAGNOSIS — E039 Hypothyroidism, unspecified: Secondary | ICD-10-CM | POA: Diagnosis not present

## 2021-06-11 DIAGNOSIS — N400 Enlarged prostate without lower urinary tract symptoms: Secondary | ICD-10-CM | POA: Diagnosis not present

## 2021-06-11 DIAGNOSIS — Z23 Encounter for immunization: Secondary | ICD-10-CM | POA: Diagnosis not present

## 2021-06-24 ENCOUNTER — Ambulatory Visit: Payer: Medicare HMO | Admitting: Physician Assistant

## 2021-06-25 ENCOUNTER — Telehealth: Payer: Self-pay

## 2021-06-25 NOTE — Telephone Encounter (Signed)
Patient called and advised that he is experiencing pain  with his prostate and wanted to know if he could have a prescription called in. ? ? ?Pharmacy: ?[id number] - [place], [place] - [address] ?

## 2021-06-26 ENCOUNTER — Ambulatory Visit: Payer: Medicare HMO | Admitting: Physician Assistant

## 2021-06-28 ENCOUNTER — Ambulatory Visit: Payer: Medicare HMO | Admitting: Physician Assistant

## 2021-06-28 NOTE — Telephone Encounter (Signed)
Returned called to patient. Wife states that her husband took some tylenol and will be ok until his f/u on this coming Tuesday. ?

## 2021-07-02 ENCOUNTER — Ambulatory Visit: Payer: Medicare HMO | Admitting: Physician Assistant

## 2021-07-02 ENCOUNTER — Other Ambulatory Visit: Payer: Self-pay

## 2021-07-02 VITALS — BP 146/72 | HR 82 | Ht 71.0 in | Wt 170.0 lb

## 2021-07-02 DIAGNOSIS — N138 Other obstructive and reflux uropathy: Secondary | ICD-10-CM | POA: Diagnosis not present

## 2021-07-02 DIAGNOSIS — R351 Nocturia: Secondary | ICD-10-CM | POA: Diagnosis not present

## 2021-07-02 DIAGNOSIS — N3001 Acute cystitis with hematuria: Secondary | ICD-10-CM | POA: Diagnosis not present

## 2021-07-02 DIAGNOSIS — N289 Disorder of kidney and ureter, unspecified: Secondary | ICD-10-CM | POA: Diagnosis not present

## 2021-07-02 DIAGNOSIS — N401 Enlarged prostate with lower urinary tract symptoms: Secondary | ICD-10-CM

## 2021-07-02 DIAGNOSIS — N5082 Scrotal pain: Secondary | ICD-10-CM | POA: Diagnosis not present

## 2021-07-02 LAB — BLADDER SCAN AMB NON-IMAGING: Scan Result: 0

## 2021-07-02 MED ORDER — SULFAMETHOXAZOLE-TRIMETHOPRIM 800-160 MG PO TABS: 1.0000 | ORAL_TABLET | Freq: Two times a day (BID) | ORAL | 0 refills | Status: DC

## 2021-07-02 MED ORDER — GEMTESA 75 MG PO TABS: 1.0000 | ORAL_TABLET | Freq: Every day | ORAL | 0 refills | Status: DC

## 2021-07-02 MED ORDER — FINASTERIDE 5 MG PO TABS: 5.0000 mg | ORAL_TABLET | Freq: Every day | ORAL | 11 refills | Status: AC

## 2021-07-02 MED ORDER — TAMSULOSIN HCL 0.4 MG PO CAPS: 0.4000 mg | ORAL_CAPSULE | Freq: Every day | ORAL | 11 refills | Status: DC

## 2021-07-02 NOTE — Progress Notes (Signed)
post void residual=0 ?

## 2021-07-02 NOTE — Progress Notes (Signed)
? ?Assessment: ?1. Acute cystitis with hematuria   ?2. Scrotal pain   ?3. Benign prostatic hyperplasia with urinary obstruction   ?4. Nocturia   ?5. Renal insufficiency   ? ? ?Plan: ?Bactrim DS twice daily for 7 days sent to the patient's pharmacy and urine sent for culture.  He will resume finasteride 5 mg daily and again explained that this is a long-term medication and may take several months to reach maximum benefit.  No Gemtesa at this time but will continue Flomax.  For the patient's scrotal pain and tenderness, scrotal ultrasound ordered. ? ?Chief Complaint: ?No chief complaint on file. ? ? ?HPI: ?Clayton Austin is a 81 y.o. male who presents for continued evaluation of urinary frequency and urgency as well as new complaint of right-sided scrotal pain for the past 3 to 4 days.  Patient's wife also states he has had significant odor to his urine for the past few days.  Patient denies fever, chills, vomiting.  No gross hematuria.  Prescriptions for finasteride, Gemtesa, Flomax have been prescribed in the past and the patient states he is taking none of them at this time. ? ?UA = greater than 30 WBCs, greater than 30 RBCs, many bacteria  ?IPSS = 14, quality-of-life 0. ? ?05/17/21 ?Clayton Austin is a 80yo here for evaluation of nocturia and inguinal bulge. He has  ahx of urethroplasty with Dr. Gillian Shields in 2019. He has been doing well after the surgery until 18 months ago when he developed worsening urinary frequency, urgency and nocturia. He has urinary frequency every 1-1.5 hours. Nocturia 5x. He has burning/pain in his glans at night when he has to urinate. He is on flomax 0.4mg  daily, finasteride 5mg  daily, and mirabegron 50mg  daily. IPSS 14 QOL 4. He is bothered by his nocturia and urinary frequency. Urine stream strong. He has noticed an inguinal bulge for over 1 year. The size of the bulge changes daily. No pain. No nausea/vomiting. ? ?Portions of the above documentation were copied from a prior visit for  review purposes only. ? ?Allergies: ?No Known Allergies ? ?PMH: ?Past Medical History:  ?Diagnosis Date  ? Benign prostatic hypertrophy   ? BPH (benign prostatic hypertrophy)   ? Coronary artery disease cardiologist-  dr  ? a. NSTEMI 07/2012 => critical LM disease on LHC => s/p CABG (L-LAD/D1, S-OM2, S-PDA/PL);  EF 55-60% at [facility] 07/2012  ? Exercise tolerance test normal   ? Dec 2014  per dr 08/2012 (cardiologist) note [facility] treadmilll score 5.5 (low risk)  ? GERD (gastroesophageal reflux disease)   ? History of atrial fibrillation   ? post-op  CABG 04/ 2014  ? History of non-ST elevation myocardial infarction (NSTEMI)   ? 04/ 2014  ? Hyperlipidemia   ? Hypertension   ? Hypothyroidism   ? Ischemic heart disease   ? STABLE PER DR 2015 (CARDIOLOGIST) NOTE 06-18-2015  ? Lower urinary tract symptoms (LUTS)   ? RBBB (right bundle branch block)   ? S/P CABG x 5   ? 08-17-2012   LIMA sequentilly to LAD and Diagonal,  SVG to OM,  SVG seq to PDA and PLB with EVH from right thigh  ? Urethral stricture   ? recurrent  ? Wears dentures   ? UPPER  ? Wears glasses   ? ? ?PSH: ?Past Surgical History:  ?Procedure Laterality Date  ? CORONARY ARTERY BYPASS GRAFT N/A 08/17/2012  ? Procedure: CORONARY ARTERY BYPASS GRAFTING (CABG);  Surgeon: 08-19-2012, MD;  Location: [facility] OR;  Service: Open Heart Surgery;  Laterality: N/A;  L-LAD/D1, S-OM2,  S-PDA/PL  ? CYSTOSCOPY N/A 08/17/2012  ? Procedure: CYSTOSCOPY FLEXIBLE for difficult  foley catheter insertion;  Surgeon: Sebastian Ache, MD;  Location: [facility] OR;  Service: Urology;  Laterality: N/A;  ? CYSTOSCOPY N/A 01/16/2016  ? Procedure: CYSTOSCOPY FLEXIBLE;  Surgeon: Sebastian Ache, MD;  Location: [facility];  Service: Urology;  Laterality: N/A;  ? CYSTOSCOPY WITH URETHRAL DILATATION N/A 01/16/2016  ? Procedure: CYSTOSCOPY WITH URETHRAL BALLOON DILATATION;  Surgeon: Sebastian Ache, MD;  Location: [facility];  Service: Urology;  Laterality:  N/A;  ? LEFT HEART CATHETERIZATION WITH CORONARY ANGIOGRAM N/A 08/16/2012  ? Procedure: LEFT HEART CATHETERIZATION WITH CORONARY ANGIOGRAM;  Surgeon: Tonny Bollman, MD;  Location: [facility] CATH LAB;  Service: Cardiovascular;  Laterality: N/A;  NSTEMI;  critical LM stenosis extending into the ostial LAD & LCFX,  moderately tight RCA stenosis,  Normal LVF, ef 55-60%  ? TRANSURETHRAL RESECTION OF PROSTATE  06/2012  ? ? ?SH: ?Social History  ? ?Tobacco Use  ? Smoking status: Former  ?  Packs/day: 0.50  ?  Years: 10.00  ?  Pack years: 5.00  ?  Types: Cigarettes  ?  Start date: 08/05/1960  ?  Quit date: 08/06/1970  ?  Years since quitting: 50.9  ? Smokeless tobacco: Never  ?Substance Use Topics  ? Alcohol use: No  ?  Alcohol/week: 0.0 standard drinks  ? Drug use: No  ? ? ?ROS: ?Constitutional:  Negative for fever, chills, weight loss ?CV: Negative for chest pain, previous MI, hypertension ?Respiratory:  Negative for shortness of breath, wheezing, sleep apnea, frequent cough ?GI:  Negative for nausea, vomiting, bloody stool, GERD ? ?PE: ?BP (!) 146/72   Pulse 82   Ht 5\' 11"  (1.803 m)   Wt 170 lb (77.1 kg)   BMI 23.71 kg/m?  ?GENERAL APPEARANCE:  Well appearing, well developed, well nourished, NAD ?HEENT:  Atraumatic, normocephalic ?NECK:  Supple. Trachea midline ?ABDOMEN:  Soft, non-tender, no masses ?GU: Scrotal exam reveals tenderness of the right testicle with a full epididymis.  No masses appreciated.  No obvious hernias or weakness noted in the abdominal wall.  Penis anatomy unremarkable for abnormality. ?EXTREMITIES:  Moves all extremities well, without clubbing, cyanosis, or edema ?NEUROLOGIC:  Alert and oriented x 3, normal gait, CN II-XII grossly intact ?MENTAL STATUS:  appropriate ?BACK:  Non-tender to palpation, No CVAT ?SKIN:  Warm, dry, and intact ? ? ?Results: ?Laboratory Data: ?Lab Results  ?Component Value Date  ? WBC 16.0 (H) 08/22/2012  ? HGB 15.3 05/04/2016  ? HCT 45.0 05/04/2016  ? MCV 86.6 08/22/2012  ?  PLT 234 08/22/2012  ? ? ?Lab Results  ?Component Value Date  ? CREATININE 1.10 05/04/2016  ? ? ?Lab Results  ?Component Value Date  ? HGBA1C 5.4 08/17/2012  ? ? ?Urinalysis ?   ?Component Value Date/Time  ? COLORURINE RED (A) 05/04/2016 1053  ? APPEARANCEUR Cloudy (A) 05/17/2021 1320  ? LABSPEC 1.025 05/04/2016 1053  ? PHURINE 6.0 05/04/2016 1053  ? GLUCOSEU Negative 05/17/2021 1320  ? HGBUR LARGE (A) 05/04/2016 1053  ? BILIRUBINUR Negative 05/17/2021 1320  ? KETONESUR NEGATIVE 05/04/2016 1053  ? PROTEINUR 2+ (A) 05/17/2021 1320  ? PROTEINUR 100 (A) 05/04/2016 1053  ? UROBILINOGEN 1.0 08/17/2012 0408  ? NITRITE Positive (A) 05/17/2021 1320  ? NITRITE NEGATIVE 05/04/2016 1053  ? LEUKOCYTESUR 3+ (A) 05/17/2021 1320  ? ? ?Lab Results  ?Component Value  Date  ? LABMICR See below: 05/17/2021  ? WBCUA >30 (A) 05/17/2021  ? LABEPIT 0-10 05/17/2021  ? BACTERIA Many (A) 05/17/2021  ? ? ?Pertinent Imaging: ?Results for orders placed during the hospital encounter of 08/25/12 ? ?DG Abd 1 View ? ?Narrative ?*RADIOLOGY REPORT* ? ?Clinical Data: Abdominal pain and vomiting.  Elevated white count. ? ?ABDOMEN - 1 VIEW ? ?Comparison: None ? ?Findings: Mildly distended gas filled loops of small bowel are ?identified within the central abdomen. ?Stool and gas in the colon and rectum are noted. ?No suspicious calcifications are identified. ?Catheter overlying the pelvis is noted. ?Degenerative changes of the lower lumbar spine and hips noted. ?No acute bony abnormalities are present. ? ?IMPRESSION: ?Nonspecific bowel gas pattern - small bowel obstruction is not ?excluded.  Consider further evaluation with CT as indicated. ? ? ?Original Report Authenticated By: Harmon PierJeffrey Hu, M.D. ? ?No results found for this or any previous visit. ? ?No results found for this or any previous visit. ? ?No results found for this or any previous visit. ? ?No results found for this or any previous visit. ? ?No results found for this or any previous visit. ? ?No  results found for this or any previous visit. ? ?No results found for this or any previous visit. ? ?No results found for this or any previous visit (from the past 24 hour(s)).  ?

## 2021-07-03 LAB — URINALYSIS, ROUTINE W REFLEX MICROSCOPIC

## 2021-07-03 LAB — MICROSCOPIC EXAMINATION
Epithelial Cells (non renal): NONE SEEN /hpf (ref 0–10)
RBC, Urine: >30 /hpf — AB (ref 0–2)
Renal Epithel, UA: NONE SEEN /hpf
WBC, UA: >30 /hpf — AB (ref 0–5)

## 2021-07-05 LAB — URINE CULTURE

## 2021-07-08 ENCOUNTER — Telehealth: Payer: Self-pay | Admitting: Internal Medicine

## 2021-07-08 NOTE — Telephone Encounter (Signed)
Called in regard to adult pull ups prescription ? ?Spouse wants a call back in regard. ?

## 2021-07-09 ENCOUNTER — Telehealth: Payer: Self-pay

## 2021-07-09 DIAGNOSIS — N400 Enlarged prostate without lower urinary tract symptoms: Secondary | ICD-10-CM | POA: Diagnosis not present

## 2021-07-09 DIAGNOSIS — I10 Essential (primary) hypertension: Secondary | ICD-10-CM | POA: Diagnosis not present

## 2021-07-09 NOTE — Telephone Encounter (Signed)
Patient called and made aware.

## 2021-07-09 NOTE — Telephone Encounter (Signed)
-----   Message from Reynaldo Minium, [place] sent at 07/08/2021  2:17 PM EDT ----- ?Please let the patient know that his urine culture came back okay, but recommend that he complete his full prescription of the Bactrim DS ?----- Message ----- ?From: Interface, [facility] Lab Results In ?Sent: 07/03/2021  10:36 AM EDT ?To: Berneice Heinrich Summerlin, PA-C ? ? ?

## 2021-07-11 DIAGNOSIS — E785 Hyperlipidemia, unspecified: Secondary | ICD-10-CM | POA: Diagnosis not present

## 2021-07-11 DIAGNOSIS — Z0001 Encounter for general adult medical examination with abnormal findings: Secondary | ICD-10-CM | POA: Diagnosis not present

## 2021-07-11 DIAGNOSIS — E039 Hypothyroidism, unspecified: Secondary | ICD-10-CM | POA: Diagnosis not present

## 2021-07-11 DIAGNOSIS — I519 Heart disease, unspecified: Secondary | ICD-10-CM | POA: Diagnosis not present

## 2021-07-25 ENCOUNTER — Ambulatory Visit (INDEPENDENT_AMBULATORY_CARE_PROVIDER_SITE_OTHER): Payer: Medicare HMO | Admitting: Physician Assistant

## 2021-07-25 VITALS — BP 143/60 | HR 79 | Ht 71.0 in | Wt 168.0 lb

## 2021-07-25 DIAGNOSIS — K4090 Unilateral inguinal hernia, without obstruction or gangrene, not specified as recurrent: Secondary | ICD-10-CM

## 2021-07-25 DIAGNOSIS — N138 Other obstructive and reflux uropathy: Secondary | ICD-10-CM | POA: Diagnosis not present

## 2021-07-25 DIAGNOSIS — K5900 Constipation, unspecified: Secondary | ICD-10-CM | POA: Diagnosis not present

## 2021-07-25 DIAGNOSIS — N3001 Acute cystitis with hematuria: Secondary | ICD-10-CM | POA: Diagnosis not present

## 2021-07-25 DIAGNOSIS — N3081 Other cystitis with hematuria: Secondary | ICD-10-CM | POA: Diagnosis not present

## 2021-07-25 DIAGNOSIS — N401 Enlarged prostate with lower urinary tract symptoms: Secondary | ICD-10-CM

## 2021-07-25 LAB — MICROSCOPIC EXAMINATION
RBC, Urine: >30 /hpf — AB (ref 0–2)
Renal Epithel, UA: NONE SEEN /hpf
WBC, UA: >30 /hpf — AB (ref 0–5)

## 2021-07-25 LAB — URINALYSIS, ROUTINE W REFLEX MICROSCOPIC
Bilirubin, UA: NEGATIVE
Glucose, UA: NEGATIVE
Ketones, UA: NEGATIVE
Nitrite, UA: NEGATIVE
Specific Gravity, UA: 1.015 (ref 1.005–1.030)
Urobilinogen, Ur: 1 mg/dL (ref 0.2–1.0)
pH, UA: 7 (ref 5.0–7.5)

## 2021-07-25 LAB — BLADDER SCAN AMB NON-IMAGING: Scan Result: 36

## 2021-07-25 MED ORDER — DOXYCYCLINE HYCLATE 100 MG PO CAPS: 100.0000 mg | ORAL_CAPSULE | Freq: Two times a day (BID) | ORAL | 0 refills | Status: DC

## 2021-07-25 NOTE — Patient Instructions (Signed)
Take over the counter Miralax daily OR Senakot-S as directed on the bottles.  ?

## 2021-07-25 NOTE — Progress Notes (Signed)
post void residual =36mL ?

## 2021-07-25 NOTE — Progress Notes (Signed)
? ?Assessment: ?1. Acute cystitis with hematuria   ?2. Benign prostatic hyperplasia with urinary obstruction   ?3. Constipation, unspecified constipation type   ?4. Unilateral inguinal hernia without obstruction or gangrene, recurrence not specified   ? ? ?Plan: ?Patient was started on doxycycline twice daily, 2 weeks prescribed.  MDX culture obtained and will adjust therapy pending those results.  I urged the patient's to take his prescriptions as prescribed in hopes of decreasing his lower urinary tract symptoms and avoiding recurrent infections.  MiraLAX or Senokot as recommended over-the-counter and if no relief from his chronic constipation, recommended evaluation with his primary care provider for this.  As the patient has no complaints of right lower quadrant, inguinal, scrotal pain today, he is given the option of proceeding with scrotal ultrasound or evaluation with Dr. Lovell Sheehan for his previously diagnosed hernia.  Follow-up in 2 to 3 weeks for urinalysis and PVR with myself or Dr. Ronne Binning. ? ?Chief Complaint: ?No chief complaint on file. ? ? ?HPI: ?Clayton Austin is a 81 y.o. male who presents for continued evaluation of urinary urgency, BPH with LUTS, and R inguinal hernia and right sided scrotal pain. Recent UTI,culture +Lactobacillus and tx with Bactrim.  Patient denies right lower quadrant pain, inguinal pain or scrotal pain at this time.  Previous scrotal ultrasound ordered, but patient did not have the study.  No fever, chills, abdominal pain, gross hematuria.  Patient states that he is taking finasteride, tamsulosin, Gemtesa as prescribed.  His wife is not sure if that is actually true.  History primarily confirmed with the patient's wife who doesn't feel he is giving correct history.  She states the patient's lower urinary tract symptoms are essentially the same. The pt also has ongoing constipation and does not like taking Miralax which we have prescribed in the past.  Upon presentation, the  patient is unable to void and noted to have 36 mL on bladder scan.  Fluids given to assist with obtaining urine specimen.  ? ?PVR: 50ml ?UA = greater than 30 WBCs greater than 30 RBCs, many bacteria, nitrite negative ?07/02/21 ?Clayton Austin is a 81 y.o. male who presents for continued evaluation of urinary frequency and urgency as well as new complaint of right-sided scrotal pain for the past 3 to 4 days.  Patient's wife also states he has had significant odor to his urine for the past few days.  Patient denies fever, chills, vomiting.  No gross hematuria.  Prescriptions for finasteride, Gemtesa, Flomax have been prescribed in the past and the patient states he is taking none of them at this time.  ?UA = greater than 30 WBCs, greater than 30 RBCs, many bacteria  ?IPSS = 14, quality-of-life 0. ?  ?05/17/21 ?Clayton Austin is a 80yo here for evaluation of nocturia and inguinal bulge. He has  ahx of urethroplasty with Dr. Gillian Shields in 2019. He has been doing well after the surgery until 18 months ago when he developed worsening urinary frequency, urgency and nocturia. He has urinary frequency every 1-1.5 hours. Nocturia 5x. He has burning/pain in his glans at night when he has to urinate. He is on flomax 0.4mg  daily, finasteride 5mg  daily, and mirabegron 50mg  daily. IPSS 14 QOL 4. He is bothered by his nocturia and urinary frequency. Urine stream strong. He has noticed an inguinal bulge for over 1 year. The size of the bulge changes daily. No pain. No nausea/vomiting. ?Portions of the above documentation were copied from a prior visit for review purposes  only. ? ?Allergies: ?No Known Allergies ? ?PMH: ?Past Medical History:  ?Diagnosis Date  ? Benign prostatic hypertrophy   ? BPH (benign prostatic hypertrophy)   ? Coronary artery disease cardiologist-  dr Purvis Sheffieldkoneswaran  ? a. NSTEMI 07/2012 => critical LM disease on LHC => s/p CABG (L-LAD/D1, S-OM2, S-PDA/PL);  [facility]  ? Exercise tolerance test normal   ? Dec  2014  per dr Purvis Sheffieldkoneswaran (cardiologist) note [facility] treadmilll score 5.5 (low risk)  ? GERD (gastroesophageal reflux disease)   ? History of atrial fibrillation   ? post-op  CABG 04/ 2014  ? History of non-ST elevation myocardial infarction (NSTEMI)   ? 04/ 2014  ? Hyperlipidemia   ? Hypertension   ? Hypothyroidism   ? Ischemic heart disease   ? STABLE PER DR Purvis SheffieldKONESWARAN (CARDIOLOGIST) NOTE 06-18-2015  ? Lower urinary tract symptoms (LUTS)   ? RBBB (right bundle branch block)   ? S/P CABG x 5   ? 08-17-2012   LIMA sequentilly to LAD and Diagonal,  SVG to OM,  SVG seq to PDA and PLB with EVH from right thigh  ? Urethral stricture   ? recurrent  ? Wears dentures   ? UPPER  ? Wears glasses   ? ? ?PSH: ?Past Surgical History:  ?Procedure Laterality Date  ? CORONARY ARTERY BYPASS GRAFT N/A 08/17/2012  ? Procedure: CORONARY ARTERY BYPASS GRAFTING (CABG);  Surgeon: Loreli SlotSteven C Hendrickson, MD;  [facility];  Service: Open Heart Surgery;  Laterality: N/A;  L-LAD/D1, S-OM2,  S-PDA/PL  ? CYSTOSCOPY N/A 08/17/2012  ? Procedure: CYSTOSCOPY FLEXIBLE for difficult  foley catheter insertion;  Surgeon: Sebastian Acheheodore Manny, MD;  [facility];  Service: Urology;  Laterality: N/A;  ? CYSTOSCOPY N/A 01/16/2016  ? Procedure: CYSTOSCOPY FLEXIBLE;  Surgeon: Sebastian Acheheodore Manny, MD;  [facility] [place];  Service: Urology;  Laterality: N/A;  ? CYSTOSCOPY WITH URETHRAL DILATATION N/A 01/16/2016  ? Procedure: CYSTOSCOPY WITH URETHRAL BALLOON DILATATION;  Surgeon: Sebastian Acheheodore Manny, MD;  [facility] [place];  Service: Urology;  Laterality: N/A;  ? LEFT HEART CATHETERIZATION WITH CORONARY ANGIOGRAM N/A 08/16/2012  ? Procedure: LEFT HEART CATHETERIZATION WITH CORONARY ANGIOGRAM;  Surgeon: Tonny BollmanMichael Cooper, MD;  [facility];  Service: Cardiovascular;  Laterality: N/A;  NSTEMI;  critical LM stenosis extending into the ostial LAD & LCFX,  moderately tight RCA stenosis,  Normal LVF, ef 55-60%  ? TRANSURETHRAL RESECTION OF  PROSTATE  06/2012  ? ? ?SH: ?Social History  ? ?Tobacco Use  ? Smoking status: Former  ?  Packs/day: 0.50  ?  Years: 10.00  ?  Pack years: 5.00  ?  Types: Cigarettes  ?  Start date: 08/05/1960  ?  Quit date: 08/06/1970  ?  Years since quitting: 51.0  ? Smokeless tobacco: Never  ?Substance Use Topics  ? Alcohol use: No  ?  Alcohol/week: 0.0 standard drinks  ? Drug use: No  ? ? ?ROS: ?Constitutional:  Negative for fever, chills, weight loss ?CV: Negative for chest pain ?Respiratory:  Negative for shortness of breath, wheezing, sleep apnea, frequent cough ?GI:  Negative for nausea, vomiting, bloody stool, GERD ? ?PE: ?BP (!) 143/60   Pulse 79   Ht 5\' 11"  (1.803 m)   Wt 168 lb (76.2 kg)   BMI 23.43 kg/m?  ?GENERAL APPEARANCE:  Well appearing, well developed, well nourished, NAD ?HEENT:  Atraumatic, normocephalic ?NECK:  Supple. Trachea midline ?ABDOMEN:  Soft, non-tender, no masses ?NEUROLOGIC:  Alert  and responds to questions, slow gait ?BACK:  Non-tender to palpation, No CVAT ?SKIN:  Warm, dry, and intact ? ? ?Results: ?Laboratory Data: ?Lab Results  ?Component Value Date  ? WBC 16.0 (H) 08/22/2012  ? HGB 15.3 05/04/2016  ? HCT 45.0 05/04/2016  ? MCV 86.6 08/22/2012  ? PLT 234 08/22/2012  ? ? ?Lab Results  ?Component Value Date  ? CREATININE 1.10 05/04/2016  ? ? ?Lab Results  ?Component Value Date  ? HGBA1C 5.4 08/17/2012  ? ? ?Urinalysis ?   ?Component Value Date/Time  ? COLORURINE RED (A) 05/04/2016 1053  ? APPEARANCEUR Cloudy (A) 05/17/2021 1320  ? LABSPEC 1.025 05/04/2016 1053  ? PHURINE 6.0 05/04/2016 1053  ? GLUCOSEU CANCELED 07/02/2021 1704  ? HGBUR LARGE (A) 05/04/2016 1053  ? BILIRUBINUR Negative 05/17/2021 1320  ? KETONESUR NEGATIVE 05/04/2016 1053  ? PROTEINUR CANCELED 07/02/2021 1704  ? PROTEINUR 100 (A) 05/04/2016 1053  ? UROBILINOGEN 1.0 08/17/2012 0408  ? NITRITE Positive (A) 05/17/2021 1320  ? NITRITE NEGATIVE 05/04/2016 1053  ? LEUKOCYTESUR 3+ (A) 05/17/2021 1320  ? ? ?Lab Results  ?Component Value  Date  ? LABMICR See below: 07/02/2021  ? WBCUA >30 (A) 07/02/2021  ? LABEPIT None seen 07/02/2021  ? BACTERIA Many (A) 07/02/2021  ? ? ?Pertinent Imaging: ?Results for orders placed during the hospital enc

## 2021-07-30 ENCOUNTER — Other Ambulatory Visit: Payer: Self-pay | Admitting: Physician Assistant

## 2021-07-30 ENCOUNTER — Telehealth: Payer: Self-pay

## 2021-07-30 NOTE — Telephone Encounter (Signed)
Patients wife called and made aware.

## 2021-07-30 NOTE — Telephone Encounter (Signed)
-----   Message from Reynaldo Minium, [place] sent at 07/30/2021  3:47 PM EDT ----- ?Let pt know urine cx is negative. No need for additional Rx for antibx ?----- Message ----- ?From: Jennette Bill ?Sent: 07/30/2021   2:27 PM EDT ?To: Berneice Heinrich Summerlin, PA-C ? ? ?

## 2021-08-09 DIAGNOSIS — I10 Essential (primary) hypertension: Secondary | ICD-10-CM | POA: Diagnosis not present

## 2021-08-09 DIAGNOSIS — N400 Enlarged prostate without lower urinary tract symptoms: Secondary | ICD-10-CM | POA: Diagnosis not present

## 2021-08-12 ENCOUNTER — Encounter: Payer: Self-pay | Admitting: Urology

## 2021-08-12 ENCOUNTER — Ambulatory Visit: Payer: Medicare HMO | Admitting: Urology

## 2021-08-12 VITALS — BP 142/70 | HR 103

## 2021-08-12 DIAGNOSIS — N138 Other obstructive and reflux uropathy: Secondary | ICD-10-CM

## 2021-08-12 DIAGNOSIS — R351 Nocturia: Secondary | ICD-10-CM

## 2021-08-12 DIAGNOSIS — N3001 Acute cystitis with hematuria: Secondary | ICD-10-CM

## 2021-08-12 DIAGNOSIS — N401 Enlarged prostate with lower urinary tract symptoms: Secondary | ICD-10-CM

## 2021-08-12 LAB — URINALYSIS, ROUTINE W REFLEX MICROSCOPIC
Bilirubin, UA: NEGATIVE
Glucose, UA: NEGATIVE
Nitrite, UA: NEGATIVE
Specific Gravity, UA: 1.025 (ref 1.005–1.030)
Urobilinogen, Ur: 1 mg/dL (ref 0.2–1.0)
pH, UA: 6.5 (ref 5.0–7.5)

## 2021-08-12 LAB — MICROSCOPIC EXAMINATION
Epithelial Cells (non renal): >10 /hpf — AB (ref 0–10)
Renal Epithel, UA: NONE SEEN /hpf
WBC, UA: >30 /hpf — AB (ref 0–5)

## 2021-08-12 MED ORDER — TAMSULOSIN HCL 0.4 MG PO CAPS: 0.4000 mg | ORAL_CAPSULE | Freq: Two times a day (BID) | ORAL | 11 refills | Status: DC

## 2021-08-12 MED ORDER — NITROFURANTOIN MONOHYD MACRO 100 MG PO CAPS: 100.0000 mg | ORAL_CAPSULE | Freq: Two times a day (BID) | ORAL | 0 refills | Status: DC

## 2021-08-12 MED ORDER — FINASTERIDE 5 MG PO TABS: 5.0000 mg | ORAL_TABLET | Freq: Every day | ORAL | 3 refills | Status: DC

## 2021-08-12 NOTE — Patient Instructions (Signed)

## 2021-08-12 NOTE — Progress Notes (Signed)
? ?08/12/2021 ?10:47 AM  ? ?Clayton Austin ?09-23-1940 ?[id number] ? ?Referring provider: Benetta Spar, MD ?[phone] ?[place],  [place] [address] ? ?Followup BPH  ? ? ?HPI: ?Clayton Austin is a 81yo here for followup for BPH with nocturia. IPSS 18 QOL 4 on flomax 0.4mg  daily. He complaints of worsening urinary urgency, frequency and dysuria. UA is concerning for infection. His urine stream is weak. He has straining to urinate. Nocturia 3-5x. Urinary frequency every 15-20 minutes. No other complaints today ? ? ?PMH: ?Past Medical History:  ?Diagnosis Date  ? Benign prostatic hypertrophy   ? BPH (benign prostatic hypertrophy)   ? Coronary artery disease cardiologist-  dr Purvis Sheffield  ? a. NSTEMI 07/2012 => critical LM disease on LHC => s/p CABG (L-LAD/D1, S-OM2, S-PDA/PL);  EF 55-60% at [facility] 07/2012  ? Exercise tolerance test normal   ? Dec 2014  per dr Purvis Sheffield (cardiologist) note [facility] treadmilll score 5.5 (low risk)  ? GERD (gastroesophageal reflux disease)   ? History of atrial fibrillation   ? post-op  CABG 04/ 2014  ? History of non-ST elevation myocardial infarction (NSTEMI)   ? 04/ 2014  ? Hyperlipidemia   ? Hypertension   ? Hypothyroidism   ? Ischemic heart disease   ? STABLE PER DR Purvis Sheffield (CARDIOLOGIST) NOTE 06-18-2015  ? Lower urinary tract symptoms (LUTS)   ? RBBB (right bundle branch block)   ? S/P CABG x 5   ? 08-17-2012   LIMA sequentilly to LAD and Diagonal,  SVG to OM,  SVG seq to PDA and PLB with EVH from right thigh  ? Urethral stricture   ? recurrent  ? Wears dentures   ? UPPER  ? Wears glasses   ? ? ?Surgical History: ?Past Surgical History:  ?Procedure Laterality Date  ? CORONARY ARTERY BYPASS GRAFT N/A 08/17/2012  ? Procedure: CORONARY ARTERY BYPASS GRAFTING (CABG);  Surgeon: Loreli Slot, MD;  Location[facility] OR;  Service: Open Heart Surgery;  Laterality: N/A;  L-LAD/D1, S-OM2,  S-PDA/PL  ? CYSTOSCOPY N/A 08/17/2012  ? Procedure: CYSTOSCOPY FLEXIBLE for difficult  foley  catheter insertion;  Surgeon: Sebastian Ache, MD;  Location: [facility] OR;  Service: Urology;  Laterality: N/A;  ? CYSTOSCOPY N/A 01/16/2016  ? Procedure: CYSTOSCOPY FLEXIBLE;  Surgeon: Sebastian Ache, MD;  Location: [facility];  Service: Urology;  Laterality: N/A;  ? CYSTOSCOPY WITH URETHRAL DILATATION N/A 01/16/2016  ? Procedure: CYSTOSCOPY WITH URETHRAL BALLOON DILATATION;  Surgeon: Sebastian Ache, MD;  Location: [facility]  Service: Urology;  Laterality: N/A;  ? LEFT HEART CATHETERIZATION WITH CORONARY ANGIOGRAM N/A 08/16/2012  ? Procedure: LEFT HEART CATHETERIZATION WITH CORONARY ANGIOGRAM;  Surgeon: Tonny Bollman, MD;  Location: [facility] CATH LAB;  Service: Cardiovascular;  Laterality: N/A;  NSTEMI;  critical LM stenosis extending into the ostial LAD & LCFX,  moderately tight RCA stenosis,  Normal LVF, ef 55-60%  ? TRANSURETHRAL RESECTION OF PROSTATE  06/2012  ? ? ?Home Medications:  ?Allergies as of 08/12/2021   ?No Known Allergies ?  ? ?  ?Medication List  ?  ? ?  ? Accurate as of August 12, 2021 10:47 AM. If you have any questions, ask your nurse or doctor.  ?  ?  ? ?  ? ?amLODipine 10 MG tablet ?Commonly known as: NORVASC ?Take 10 mg by mouth every morning. ?  ?aspirin EC 81 MG tablet ?Take 1 tablet (81 mg total) by mouth daily. ?  ?atorvastatin 20 MG tablet ?  Commonly known as: LIPITOR ?Take 20 mg by mouth every morning. ?  ?doxycycline 100 MG capsule ?Commonly known as: VIBRAMYCIN ?Take 1 capsule (100 mg total) by mouth every 12 (twelve) hours. ?  ?Gemtesa 75 MG Tabs ?Generic drug: Vibegron ?Take 1 capsule by mouth daily. ?  ?levothyroxine 25 MCG tablet ?Commonly known as: SYNTHROID ?Take 1 tablet (25 mcg total) by mouth daily before breakfast. ?  ?levothyroxine 75 MCG tablet ?Commonly known as: SYNTHROID ?Take 75 mcg by mouth daily. ?  ?losartan 25 MG tablet ?Commonly known as: COZAAR ?Take 1 tablet by mouth daily. ?  ?metoprolol tartrate 25 MG tablet ?Commonly known as: LOPRESSOR ?Take 1  tablet (25 mg total) by mouth 2 (two) times daily. ?  ?omeprazole 20 MG capsule ?Commonly known as: PRILOSEC ?Take 20 mg by mouth every morning. ?  ?tamsulosin 0.4 MG Caps capsule ?Commonly known as: FLOMAX ?Take 1 capsule (0.4 mg total) by mouth daily after breakfast. ?  ? ?  ? ? ?Allergies: No Known Allergies ? ?Family History: ?Family History  ?Problem Relation Age of Onset  ? Coronary artery disease Father 5265  ? ? ?Social History:  reports that he quit smoking about 51 years ago. His smoking use included cigarettes. He started smoking about 61 years ago. He has a 5.00 pack-year smoking history. He has never used smokeless tobacco. He reports that he does not drink alcohol and does not use drugs. ? ?ROS: ?All other review of systems were reviewed and are negative except what is noted above in HPI ? ?Physical Exam: ?BP (!) 142/70   Pulse (!) 103   ?Constitutional:  Alert and oriented, No acute distress. ?HEENT: [place] AT, moist mucus membranes.  Trachea midline, no masses. ?Cardiovascular: No clubbing, cyanosis, or edema. ?Respiratory: Normal respiratory effort, no increased work of breathing. ?GI: Abdomen is soft, nontender, nondistended, no abdominal masses ?GU: No CVA tenderness.  ?Lymph: No cervical or inguinal lymphadenopathy. ?Skin: No rashes, bruises or suspicious lesions. ?Neurologic: Grossly intact, no focal deficits, moving all 4 extremities. ?Psychiatric: Normal mood and affect. ? ?Laboratory Data: ?Lab Results  ?Component Value Date  ? WBC 16.0 (H) 08/22/2012  ? HGB 15.3 05/04/2016  ? HCT 45.0 05/04/2016  ? MCV 86.6 08/22/2012  ? PLT 234 08/22/2012  ? ? ?Lab Results  ?Component Value Date  ? CREATININE 1.10 05/04/2016  ? ? ?No results found for: PSA ? ?No results found for: TESTOSTERONE ? ?Lab Results  ?Component Value Date  ? HGBA1C 5.4 08/17/2012  ? ? ?Urinalysis ?   ?Component Value Date/Time  ? COLORURINE RED (A) 05/04/2016 1053  ? APPEARANCEUR Clear 07/25/2021 1151  ? LABSPEC 1.025 05/04/2016 1053  ?  PHURINE 6.0 05/04/2016 1053  ? GLUCOSEU Negative 07/25/2021 1151  ? HGBUR LARGE (A) 05/04/2016 1053  ? BILIRUBINUR Negative 07/25/2021 1151  ? KETONESUR NEGATIVE 05/04/2016 1053  ? PROTEINUR 2+ (A) 07/25/2021 1151  ? PROTEINUR 100 (A) 05/04/2016 1053  ? UROBILINOGEN 1.0 08/17/2012 0408  ? NITRITE Negative 07/25/2021 1151  ? NITRITE NEGATIVE 05/04/2016 1053  ? LEUKOCYTESUR 3+ (A) 07/25/2021 1151  ? ? ?Lab Results  ?Component Value Date  ? LABMICR See below: 07/25/2021  ? WBCUA >30 (A) 07/25/2021  ? LABEPIT 0-10 07/25/2021  ? MUCUS Present 07/25/2021  ? BACTERIA Many (A) 07/25/2021  ? ? ?Pertinent Imaging: ? ?Results for orders placed during the hospital encounter of 08/25/12 ? ?DG Abd 1 View ? ?Narrative ?*RADIOLOGY REPORT* ? ?Clinical Data: Abdominal pain and vomiting.  Elevated  white count. ? ?ABDOMEN - 1 VIEW ? ?Comparison: None ? ?Findings: Mildly distended gas filled loops of small bowel are ?identified within the central abdomen. ?Stool and gas in the colon and rectum are noted. ?No suspicious calcifications are identified. ?Catheter overlying the pelvis is noted. ?Degenerative changes of the lower lumbar spine and hips noted. ?No acute bony abnormalities are present. ? ?IMPRESSION: ?Nonspecific bowel gas pattern - small bowel obstruction is not ?excluded.  Consider further evaluation with CT as indicated. ? ? ?Original Report Authenticated By: Harmon Pier, M.D. ? ?No results found for this or any previous visit. ? ?No results found for this or any previous visit. ? ?No results found for this or any previous visit. ? ?No results found for this or any previous visit. ? ?No results found for this or any previous visit. ? ?No results found for this or any previous visit. ? ?No results found for this or any previous visit. ? ? ?Assessment & Plan:   ? ?1. Acute cystitis with hematuria ?-Urine for culture, will call with results ?-macrobid 100mg  BID for 7 days ?- Urinalysis, Routine w reflex microscopic ? ?2. Benign  prostatic hyperplasia with urinary obstruction ?-continue flomax 0.4mg  daily ?- Urinalysis, Routine w reflex microscopic ? ?3. Nocturia ?-continue flomax 0.4mg  daily. Continue finasteride ? ? ?No follo

## 2021-08-16 LAB — URINE CULTURE

## 2021-08-21 ENCOUNTER — Ambulatory Visit: Payer: Medicare HMO | Admitting: Urology

## 2021-08-27 ENCOUNTER — Telehealth: Payer: Self-pay

## 2021-08-27 NOTE — Telephone Encounter (Signed)
Patient called advising he needed medication refilled. Patient wanted phone call once medication  was called in.  ? ? ?Medication: ?macrobid 100mg   ? ? ? ?Pharmacy: ?[id number] - [place], [place] - [address] ? ? ?

## 2021-08-28 NOTE — Telephone Encounter (Signed)
Called patient to see if he was still having symptoms of a UTI.  Spoke to wife and she states that he is still having discomfort but not as bad as before.  She was not with the patient so she was unable to give anymore details of his symptoms.  They are scheduled for a f/u on 5/19 but are requesting to have that apt moved up to this week if possible.  The wife will call back once she speaks to the patient to confirm moving the apt.  ?

## 2021-09-06 ENCOUNTER — Ambulatory Visit: Payer: Medicare HMO | Admitting: Urology

## 2021-09-06 ENCOUNTER — Encounter: Payer: Self-pay | Admitting: Urology

## 2021-09-06 VITALS — BP 139/76 | HR 99 | Ht 71.0 in | Wt 165.0 lb

## 2021-09-06 DIAGNOSIS — N138 Other obstructive and reflux uropathy: Secondary | ICD-10-CM

## 2021-09-06 DIAGNOSIS — R351 Nocturia: Secondary | ICD-10-CM | POA: Diagnosis not present

## 2021-09-06 DIAGNOSIS — N401 Enlarged prostate with lower urinary tract symptoms: Secondary | ICD-10-CM

## 2021-09-06 LAB — URINALYSIS, ROUTINE W REFLEX MICROSCOPIC
Bilirubin, UA: NEGATIVE
Glucose, UA: NEGATIVE
Nitrite, UA: NEGATIVE
Specific Gravity, UA: 1.025 (ref 1.005–1.030)
Urobilinogen, Ur: 1 mg/dL (ref 0.2–1.0)
pH, UA: 7 (ref 5.0–7.5)

## 2021-09-06 LAB — MICROSCOPIC EXAMINATION
Renal Epithel, UA: NONE SEEN /hpf
WBC, UA: >30 /hpf — AB (ref 0–5)

## 2021-09-06 MED ORDER — FINASTERIDE 5 MG PO TABS: 5.0000 mg | ORAL_TABLET | Freq: Every day | ORAL | 3 refills | Status: DC

## 2021-09-06 MED ORDER — TAMSULOSIN HCL 0.4 MG PO CAPS: 0.4000 mg | ORAL_CAPSULE | Freq: Two times a day (BID) | ORAL | 11 refills | Status: DC

## 2021-09-06 NOTE — Patient Instructions (Signed)

## 2021-09-06 NOTE — Progress Notes (Signed)
09/06/2021 12:27 PM   Loann QuillJames W Fearnow August 02, 1940 [id number]  Referring provider: Benetta SparFanta, Tesfaye Demissie, MD [address] [place],  [place] [phone]  Followup BPH   HPI:  Mr Jenelle MagesHairston is a 81yo here for followup for BPH with nocturia. IPSS 14 QOl 2 with increasing flomax to BID. He remains on finasteride. Nocturia 3x. He is doing well since finishing macrobid for 7 days. No dysuria or hematuria. No other complaints today  PMH: Past Medical History:  Diagnosis Date   Benign prostatic hypertrophy    BPH (benign prostatic hypertrophy)    Coronary artery disease cardiologist-  dr Purvis Sheffieldkoneswaran   a. NSTEMI 07/2012 => critical LM disease on LHC => s/p CABG (L-LAD/D1, S-OM2, S-PDA/PL);  [facility]   Exercise tolerance test normal    Dec 2014  per dr Purvis Sheffieldkoneswaran (cardiologist) note [facility] treadmilll score 5.5 (low risk)   GERD (gastroesophageal reflux disease)    History of atrial fibrillation    post-op  CABG 04/ 2014   History of non-ST elevation myocardial infarction (NSTEMI)    04/ 2014   Hyperlipidemia    Hypertension    Hypothyroidism    Ischemic heart disease    STABLE PER DR Purvis SheffieldKONESWARAN (CARDIOLOGIST) NOTE 06-18-2015   Lower urinary tract symptoms (LUTS)    RBBB (right bundle branch block)    S/P CABG x 5    08-17-2012   LIMA sequentilly to LAD and Diagonal,  SVG to OM,  SVG seq to PDA and PLB with EVH from right thigh   Urethral stricture    recurrent   Wears dentures    UPPER   Wears glasses     Surgical History: Past Surgical History:  Procedure Laterality Date   CORONARY ARTERY BYPASS GRAFT N/A 08/17/2012   Procedure: CORONARY ARTERY BYPASS GRAFTING (CABG);  Surgeon: Loreli SlotSteven C Hendrickson, MD;  [facility];  Service: Open Heart Surgery;  Laterality: N/A;  L-LAD/D1, S-OM2,  S-PDA/PL   CYSTOSCOPY N/A 08/17/2012   Procedure: CYSTOSCOPY FLEXIBLE for difficult  foley catheter insertion;  Surgeon: Sebastian Acheheodore Manny, MD;  [facility];  Service:  Urology;  Laterality: N/A;   CYSTOSCOPY N/A 01/16/2016   Procedure: CYSTOSCOPY FLEXIBLE;  Surgeon: Sebastian Acheheodore Manny, MD;  [facility];  Service: Urology;  Laterality: N/A;   CYSTOSCOPY WITH URETHRAL DILATATION N/A 01/16/2016   Procedure: CYSTOSCOPY WITH URETHRAL BALLOON DILATATION;  Surgeon: Sebastian Acheheodore Manny, MD;  [facility];  Service: Urology;  Laterality: N/A;   LEFT HEART CATHETERIZATION WITH CORONARY ANGIOGRAM N/A 08/16/2012   Procedure: LEFT HEART CATHETERIZATION WITH CORONARY ANGIOGRAM;  Surgeon: Tonny BollmanMichael Cooper, MD;  [facility];  Service: Cardiovascular;  Laterality: N/A;  NSTEMI;  critical LM stenosis extending into the ostial LAD & LCFX,  moderately tight RCA stenosis,  Normal LVF, ef 55-60%   TRANSURETHRAL RESECTION OF PROSTATE  06/2012    Home Medications:  Allergies as of 09/06/2021   No Known Allergies      Medication List        Accurate as of Sep 06, 2021 12:27 PM. If you have any questions, ask your nurse or doctor.          amLODipine 10 MG tablet Commonly known as: NORVASC Take 10 mg by mouth every morning.   aspirin EC 81 MG tablet Take 1 tablet (81 mg total) by mouth daily.   atorvastatin 20 MG tablet Commonly known as: LIPITOR Take 20 mg by mouth every morning.  finasteride 5 MG tablet Commonly known as: PROSCAR Take 1 tablet (5 mg total) by mouth daily.   levothyroxine 25 MCG tablet Commonly known as: SYNTHROID Take 1 tablet (25 mcg total) by mouth daily before breakfast.   levothyroxine 75 MCG tablet Commonly known as: SYNTHROID Take 75 mcg by mouth daily.   losartan 25 MG tablet Commonly known as: COZAAR Take 1 tablet by mouth daily.   metoprolol tartrate 25 MG tablet Commonly known as: LOPRESSOR Take 1 tablet (25 mg total) by mouth 2 (two) times daily.   nitrofurantoin (macrocrystal-monohydrate) 100 MG capsule Commonly known as: MACROBID Take 1 capsule (100 mg total) by mouth  every 12 (twelve) hours.   omeprazole 20 MG capsule Commonly known as: PRILOSEC Take 20 mg by mouth every morning.   tamsulosin 0.4 MG Caps capsule Commonly known as: FLOMAX Take 1 capsule (0.4 mg total) by mouth 2 (two) times daily.        Allergies: No Known Allergies  Family History: Family History  Problem Relation Age of Onset   Coronary artery disease Father 8    Social History:  reports that he quit smoking about 51 years ago. His smoking use included cigarettes. He started smoking about 61 years ago. He has a 5.00 pack-year smoking history. He has never used smokeless tobacco. He reports that he does not drink alcohol and does not use drugs.  ROS: All other review of systems were reviewed and are negative except what is noted above in HPI  Physical Exam: BP 139/76   Pulse 99   Ht 5\' 11"  (1.803 m)   Wt 165 lb (74.8 kg)   BMI 23.01 kg/m   Constitutional:  Alert and oriented, No acute distress. HEENT: [place] AT, moist mucus membranes.  Trachea midline, no masses. Cardiovascular: No clubbing, cyanosis, or edema. Respiratory: Normal respiratory effort, no increased work of breathing. GI: Abdomen is soft, nontender, nondistended, no abdominal masses GU: No CVA tenderness.  Lymph: No cervical or inguinal lymphadenopathy. Skin: No rashes, bruises or suspicious lesions. Neurologic: Grossly intact, no focal deficits, moving all 4 extremities. Psychiatric: Normal mood and affect.  Laboratory Data: Lab Results  Component Value Date   WBC 16.0 (H) 08/22/2012   HGB 15.3 05/04/2016   HCT 45.0 05/04/2016   MCV 86.6 08/22/2012   PLT 234 08/22/2012    Lab Results  Component Value Date   CREATININE 1.10 05/04/2016    No results found for: PSA  No results found for: TESTOSTERONE  Lab Results  Component Value Date   HGBA1C 5.4 08/17/2012    Urinalysis    Component Value Date/Time   COLORURINE RED (A) 05/04/2016 1053   APPEARANCEUR Cloudy (A) 08/12/2021 1048    LABSPEC 1.025 05/04/2016 1053   PHURINE 6.0 05/04/2016 1053   GLUCOSEU Negative 08/12/2021 1048   HGBUR LARGE (A) 05/04/2016 1053   BILIRUBINUR Negative 08/12/2021 1048   KETONESUR NEGATIVE 05/04/2016 1053   PROTEINUR 2+ (A) 08/12/2021 1048   PROTEINUR 100 (A) 05/04/2016 1053   UROBILINOGEN 1.0 08/17/2012 0408   NITRITE Negative 08/12/2021 1048   NITRITE NEGATIVE 05/04/2016 1053   LEUKOCYTESUR 2+ (A) 08/12/2021 1048    Lab Results  Component Value Date   LABMICR See below: 08/12/2021   WBCUA >30 (A) 08/12/2021   LABEPIT >10 (A) 08/12/2021   MUCUS Present 08/12/2021   BACTERIA Many (A) 08/12/2021    Pertinent Imaging:  Results for orders placed during the hospital encounter of 08/25/12  DG Abd 1 View  Narrative *RADIOLOGY REPORT*  Clinical Data: Abdominal pain and vomiting.  Elevated white count.  ABDOMEN - 1 VIEW  Comparison: None  Findings: Mildly distended gas filled loops of small bowel are identified within the central abdomen. Stool and gas in the colon and rectum are noted. No suspicious calcifications are identified. Catheter overlying the pelvis is noted. Degenerative changes of the lower lumbar spine and hips noted. No acute bony abnormalities are present.  IMPRESSION: Nonspecific bowel gas pattern - small bowel obstruction is not excluded.  Consider further evaluation with CT as indicated.   Original Report Authenticated By: Harmon Pier, M.D.  No results found for this or any previous visit.  No results found for this or any previous visit.  No results found for this or any previous visit.  No results found for this or any previous visit.  No results found for this or any previous visit.  No results found for this or any previous visit.  No results found for this or any previous visit.   Assessment & Plan:    1. Benign prostatic hyperplasia with urinary obstruction continue flomax BID and finasteride. - Urinalysis, Routine w reflex  microscopic  2. Nocturia -continue flomax BID and finasteride. Patient instructed to wear compression stockings to decrease his lower extremity edema   No follow-ups on file.  [name], MD  [facility] [place]

## 2021-09-08 DIAGNOSIS — I2510 Atherosclerotic heart disease of native coronary artery without angina pectoris: Secondary | ICD-10-CM | POA: Diagnosis not present

## 2021-09-08 DIAGNOSIS — I10 Essential (primary) hypertension: Secondary | ICD-10-CM | POA: Diagnosis not present

## 2021-09-19 ENCOUNTER — Ambulatory Visit (INDEPENDENT_AMBULATORY_CARE_PROVIDER_SITE_OTHER): Payer: Medicare HMO | Admitting: Physician Assistant

## 2021-09-19 VITALS — BP 109/63 | HR 82 | Ht 71.5 in | Wt 169.0 lb

## 2021-09-19 DIAGNOSIS — R351 Nocturia: Secondary | ICD-10-CM | POA: Diagnosis not present

## 2021-09-19 DIAGNOSIS — N3001 Acute cystitis with hematuria: Secondary | ICD-10-CM

## 2021-09-19 DIAGNOSIS — R339 Retention of urine, unspecified: Secondary | ICD-10-CM | POA: Diagnosis not present

## 2021-09-19 LAB — URINALYSIS, ROUTINE W REFLEX MICROSCOPIC
Bilirubin, UA: NEGATIVE
Glucose, UA: NEGATIVE
Nitrite, UA: NEGATIVE
Specific Gravity, UA: >1.03 — ABNORMAL HIGH (ref 1.005–1.030)
Urobilinogen, Ur: 1 mg/dL (ref 0.2–1.0)
pH, UA: 7 (ref 5.0–7.5)

## 2021-09-19 LAB — BLADDER SCAN AMB NON-IMAGING: Scan Result: 9

## 2021-09-19 MED ORDER — NITROFURANTOIN MONOHYD MACRO 100 MG PO CAPS: 100.0000 mg | ORAL_CAPSULE | Freq: Two times a day (BID) | ORAL | 0 refills | Status: DC

## 2021-09-19 NOTE — Progress Notes (Signed)
post void residual =9mL 

## 2021-09-19 NOTE — Progress Notes (Signed)
Assessment: 1. Urine retention - BLADDER SCAN AMB NON-IMAGING - Urinalysis, Routine w reflex microscopic  2. Nocturia - nitrofurantoin, macrocrystal-monohydrate, (MACROBID) 100 MG capsule; Take 1 capsule (100 mg total) by mouth every 12 (twelve) hours.  Dispense: 14 capsule; Refill: 0  3. Acute cystitis with hematuria - Urine Culture    Plan: Urine culture ordered and patient started back on Macrobid for 7 days.  We will adjust therapy pending culture result.  He is advised to increase his fluid intake and continue previous prescriptions of twice daily tamsulosin and finasteride daily.  Keep follow-up already scheduled later this months with Dr. Ronne BinningMcKenzie.  Chief Complaint: No chief complaint on file.   HPI: Clayton Austin is a 81 y.o. male who presents for evaluation for c/o inability to void since yesterday afternoon.  He reports voiding only small amounts with some frequency, nocturia, but denies burning, dysuria, gross hematuria, fever or chills.. Pt voided upon arrival to the office, but only a few millimeters, not enough for microscopic.  Last UTI treated on 4/24 with 7 days of Macrobid.  Culture was positive for lactobacillus species greater than 100,000 colonies.    PVR= 9ml UA = ketones, 1+ blood, 3+ protein, 3+, leukocytes 3+, nitrite negative, patient unable to void enough urine for microscopic, but culture obtained  09/06/21 Clayton Austin is a 81yo here for followup for BPH with nocturia. IPSS 14 QOl 2 with increasing flomax to BID. He remains on finasteride. Nocturia 3x. He is doing well since finishing macrobid for 7 days. No dysuria or hematuria. No other complaints today.  Portions of the above documentation were copied from a prior visit for review purposes only.  Allergies: No Known Allergies  PMH: Past Medical History:  Diagnosis Date   Benign prostatic hypertrophy    BPH (benign prostatic hypertrophy)    Coronary artery disease cardiologist-  dr Purvis Sheffieldkoneswaran    a. NSTEMI 07/2012 => critical LM disease on LHC => s/p CABG (L-LAD/D1, S-OM2, S-PDA/PL);  [facility]   Exercise tolerance test normal    Dec 2014  per dr Purvis Sheffieldkoneswaran (cardiologist) note [facility] treadmilll score 5.5 (low risk)   GERD (gastroesophageal reflux disease)    History of atrial fibrillation    post-op  CABG 04/ 2014   History of non-ST elevation myocardial infarction (NSTEMI)    04/ 2014   Hyperlipidemia    Hypertension    Hypothyroidism    Ischemic heart disease    STABLE PER DR Purvis SheffieldKONESWARAN (CARDIOLOGIST) NOTE 06-18-2015   Lower urinary tract symptoms (LUTS)    RBBB (right bundle branch block)    S/P CABG x 5    08-17-2012   LIMA sequentilly to LAD and Diagonal,  SVG to OM,  SVG seq to PDA and PLB with EVH from right thigh   Urethral stricture    recurrent   Wears dentures    UPPER   Wears glasses     PSH: Past Surgical History:  Procedure Laterality Date   CORONARY ARTERY BYPASS GRAFT N/A 08/17/2012   Procedure: CORONARY ARTERY BYPASS GRAFTING (CABG);  Surgeon: Loreli SlotSteven C Hendrickson, MD;  [facility];  Service: Open Heart Surgery;  Laterality: N/A;  L-LAD/D1, S-OM2,  S-PDA/PL   CYSTOSCOPY N/A 08/17/2012   Procedure: CYSTOSCOPY FLEXIBLE for difficult  foley catheter insertion;  Surgeon: Sebastian Acheheodore Manny, MD;  [facility];  Service: Urology;  Laterality: N/A;   CYSTOSCOPY N/A 01/16/2016   Procedure: CYSTOSCOPY FLEXIBLE;  Surgeon: Sebastian Acheheodore Manny, MD;  Location: [facility];  Service: Urology;  Laterality: N/A;   CYSTOSCOPY WITH URETHRAL DILATATION N/A 01/16/2016   Procedure: CYSTOSCOPY WITH URETHRAL BALLOON DILATATION;  Surgeon: Sebastian Ache, MD;  [facility];  Service: Urology;  Laterality: N/A;   LEFT HEART CATHETERIZATION WITH CORONARY ANGIOGRAM N/A 08/16/2012   Procedure: LEFT HEART CATHETERIZATION WITH CORONARY ANGIOGRAM;  Surgeon: Tonny Bollman, MD;  Location[facility] CATH LAB;  Service: Cardiovascular;  Laterality:  N/A;  NSTEMI;  critical LM stenosis extending into the ostial LAD & LCFX,  moderately tight RCA stenosis,  Normal LVF, ef 55-60%   TRANSURETHRAL RESECTION OF PROSTATE  06/2012    SH: Social History   Tobacco Use   Smoking status: Former    Packs/day: 0.50    Years: 10.00    Pack years: 5.00    Types: Cigarettes    Start date: 08/05/1960    Quit date: 08/06/1970    Years since quitting: 51.1   Smokeless tobacco: Never  Substance Use Topics   Alcohol use: No    Alcohol/week: 0.0 standard drinks   Drug use: No    ROS: See HPI  PE: BP 109/63   Pulse 82   Ht 5' 11.5" (1.816 m)   Wt 169 lb (76.7 kg)   BMI 23.24 kg/m  GENERAL APPEARANCE:  Well appearing, well developed, well nourished, NAD HEENT:  Atraumatic, normocephalic NECK:  Supple. Trachea midline ABDOMEN:  Soft, non-tender, no masses EXTREMITIES:  Moves all extremities well NEUROLOGIC:  Alert and oriented x 3 MENTAL STATUS:  appropriate BACK:  Non-tender to palpation, No CVAT SKIN:  Warm, dry, and intact   Results: Laboratory Data: Lab Results  Component Value Date   WBC 16.0 (H) 08/22/2012   HGB 15.3 05/04/2016   HCT 45.0 05/04/2016   MCV 86.6 08/22/2012   PLT 234 08/22/2012    Lab Results  Component Value Date   CREATININE 1.10 05/04/2016     Lab Results  Component Value Date   HGBA1C 5.4 08/17/2012    Urinalysis    Component Value Date/Time   COLORURINE RED (A) 05/04/2016 1053   APPEARANCEUR Cloudy (A) 09/06/2021 1222   LABSPEC 1.025 05/04/2016 1053   PHURINE 6.0 05/04/2016 1053   GLUCOSEU Negative 09/06/2021 1222   HGBUR LARGE (A) 05/04/2016 1053   BILIRUBINUR Negative 09/06/2021 1222   KETONESUR NEGATIVE 05/04/2016 1053   PROTEINUR 2+ (A) 09/06/2021 1222   PROTEINUR 100 (A) 05/04/2016 1053   UROBILINOGEN 1.0 08/17/2012 0408   NITRITE Negative 09/06/2021 1222   NITRITE NEGATIVE 05/04/2016 1053   LEUKOCYTESUR 3+ (A) 09/06/2021 1222    Lab Results  Component Value Date   LABMICR  See below: 09/06/2021   WBCUA >30 (A) 09/06/2021   LABEPIT 0-10 09/06/2021   MUCUS Present 08/12/2021   BACTERIA Many (A) 09/06/2021    Pertinent Imaging:  Results for orders placed during the hospital encounter of 08/25/12  DG Abd 1 View  Narrative *RADIOLOGY REPORT*  Clinical Data: Abdominal pain and vomiting.  Elevated white count.  ABDOMEN - 1 VIEW  Comparison: None  Findings: Mildly distended gas filled loops of small bowel are identified within the central abdomen. Stool and gas in the colon and rectum are noted. No suspicious calcifications are identified. Catheter overlying the pelvis is noted. Degenerative changes of the lower lumbar spine and hips noted. No acute bony abnormalities are present.  IMPRESSION: Nonspecific bowel gas pattern - small bowel obstruction is not excluded.  Consider further evaluation with  CT as indicated.   Original Report Authenticated By: Harmon Pier, M.D.  No results found for this or any previous visit.  No results found for this or any previous visit.  No results found for this or any previous visit.  No results found for this or any previous visit.  No results found for this or any previous visit.  No results found for this or any previous visit.  No results found for this or any previous visit.  No results found for this or any previous visit (from the past 24 hour(s)).

## 2021-09-19 NOTE — Patient Instructions (Signed)
Increase fluid intake for the next 2-3 days

## 2021-09-21 LAB — URINE CULTURE

## 2021-09-25 ENCOUNTER — Telehealth: Payer: Self-pay

## 2021-09-25 NOTE — Telephone Encounter (Signed)
-----   Message from [facility] [place] sent at 09/24/2021  5:07 PM EDT ----- Please let pt know his cx shows multiple species which can mean more than one bacteria or contaminant. If his sxs have not resolved since he started [facility], he needs to come drop a urine for MDX cx ----- Message ----- From: Interface, [facility] Lab Results In Sent: 09/19/2021   5:42 PM EDT To: Sydnee Levans, PA-C

## 2021-09-25 NOTE — Telephone Encounter (Signed)
Wife aware and voiced understanding, she states that his sx's have resolved and he is improving.

## 2021-10-09 ENCOUNTER — Ambulatory Visit: Payer: Medicare HMO | Admitting: Urology

## 2021-10-09 VITALS — BP 138/83 | HR 59

## 2021-10-09 DIAGNOSIS — N138 Other obstructive and reflux uropathy: Secondary | ICD-10-CM

## 2021-10-09 DIAGNOSIS — N401 Enlarged prostate with lower urinary tract symptoms: Secondary | ICD-10-CM | POA: Diagnosis not present

## 2021-10-09 DIAGNOSIS — I10 Essential (primary) hypertension: Secondary | ICD-10-CM | POA: Diagnosis not present

## 2021-10-09 DIAGNOSIS — R39198 Other difficulties with micturition: Secondary | ICD-10-CM

## 2021-10-09 DIAGNOSIS — I2510 Atherosclerotic heart disease of native coronary artery without angina pectoris: Secondary | ICD-10-CM | POA: Diagnosis not present

## 2021-10-09 LAB — URINALYSIS, ROUTINE W REFLEX MICROSCOPIC
Bilirubin, UA: NEGATIVE
Glucose, UA: NEGATIVE
Ketones, UA: NEGATIVE
Nitrite, UA: NEGATIVE
Specific Gravity, UA: 1.02 (ref 1.005–1.030)
Urobilinogen, Ur: 1 mg/dL (ref 0.2–1.0)
pH, UA: 8.5 — ABNORMAL HIGH (ref 5.0–7.5)

## 2021-10-09 LAB — MICROSCOPIC EXAMINATION: Renal Epithel, UA: NONE SEEN /hpf

## 2021-10-09 MED ORDER — CIPROFLOXACIN HCL 500 MG PO TABS
500.0000 mg | ORAL_TABLET | Freq: Once | ORAL | Status: AC
  Administered 2021-10-09: 500 mg via ORAL

## 2021-10-09 NOTE — Progress Notes (Unsigned)
   10/09/21  CC: No chief complaint on file.   HPI:  Blood pressure 114/63, pulse (!) 59. NED. A&Ox3.   No respiratory distress   Abd soft, NT, ND Normal phallus with bilateral descended testicles  Cystoscopy Procedure Note  Patient identification was confirmed, informed consent was obtained, and patient was prepped using Betadine solution.  Lidocaine jelly was administered per urethral meatus.     Pre-Procedure: - Inspection reveals a normal caliber ureteral meatus.  Procedure: The flexible cystoscope was introduced without difficulty - No urethral strictures/lesions are present. - {Blank multiple:19197::"Enlarged","Surgically absent","Normal"} prostate *** - {Blank multiple:19197::"Normal","Elevated","Tight"} bladder neck - Bilateral ureteral orifices identified - Bladder mucosa  reveals no ulcers, tumors, or lesions - No bladder stones - No trabeculation  Retroflexion shows ***   Post-Procedure: - Patient tolerated the procedure well  Assessment/ Plan: Schedule for cystolithalopaxy  No follow-ups on file.  [name], MD

## 2021-10-09 NOTE — H&P (View-Only) (Signed)
   10/09/21  CC: difficulty urinating   HPI: Clayton Austin is a 81yo her for cystoscopy for difficulty urinating Blood pressure 114/63, pulse (!) 59. NED. A&Ox3.   No respiratory distress   Abd soft, NT, ND Normal phallus with bilateral descended testicles  Cystoscopy Procedure Note  Patient identification was confirmed, informed consent was obtained, and patient was prepped using Betadine solution.  Lidocaine jelly was administered per urethral meatus.     Pre-Procedure: - Inspection reveals a normal caliber ureteral meatus.  Procedure: The flexible cystoscope was introduced without difficulty - No urethral strictures/lesions are present. - Enlarged prostate no median lobe - Normal bladder neck - Bilateral ureteral orifices identified - Bladder mucosa  reveals no ulcers, tumors, or lesions - multiple 5-41mm bladder stones - No trabeculation     Post-Procedure: - Patient tolerated the procedure well  Assessment/ Plan: Schedule for cystolithalopaxy. Risks/benefits/alternatives discussed  No follow-ups on file.  [name], MD

## 2021-10-15 ENCOUNTER — Encounter: Payer: Self-pay | Admitting: Urology

## 2021-10-25 NOTE — Patient Instructions (Signed)
Clayton Austin  10/25/2021     @PREFPERIOPPHARMACY @   Your procedure is scheduled on  10/31/2021.   Report to [facility] at  1230 P.M.   Call this number if you have problems the morning of surgery:  [phone]   Remember:  Do not eat or drink after midnight.      Take these medicines the morning of surgery with A SIP OF WATER        norvasc, synthroid, proscar, metoprolol, macrobid, prilosec, flomax.     Do not wear jewelry, make-up or nail polish.  Do not wear lotions, powders, or perfumes, or deodorant.  Do not shave 48 hours prior to surgery.  Men may shave face and neck.  Do not bring valuables to the hospital.  [facility] is not responsible for any belongings or valuables.  Contacts, dentures or bridgework may not be worn into surgery.  Leave your suitcase in the car.  After surgery it may be brought to your room.  For patients admitted to the hospital, discharge time will be determined by your treatment team.  Patients discharged the day of surgery will not be allowed to drive home and must have someone with them for 24 hours.    Special instructions:   DO NOT smoke tobacco or vape for 24 hours before your procedure.  Please read over the following fact sheets that you were given. Coughing and Deep Breathing, Surgical Site Infection Prevention, Anesthesia Post-op Instructions, and Care and Recovery After Surgery      Laser Therapy for Kidney Stones, Care After This sheet gives you information about how to care for yourself after your procedure. Your health care provider may also give you more specific instructions. If you have problems or questions, contact your health care provider. What can I expect after the procedure? After the procedure, it is common to have: Pain. A burning sensation while urinating. Small amounts of blood in your urine. A need to urinate frequently. Pieces of kidney stone in your urine. Mild discomfort when urinating that  may be felt in the back. You may experience this if you have a flexible tube (stent) in your ureter. Follow these instructions at home:  Medicines Take over-the-counter and prescription medicines only as told by your health care provider. If you were prescribed an antibiotic medicine, take it as told by your health care provider. Do not stop taking the antibiotic even if you start to feel better. Ask your health care provider if the medicine prescribed to you: Requires you to avoid driving or using heavy machinery. Can cause constipation. You may need to take actions to prevent or treat constipation, such as: Take over-the-counter or prescription medicines. Eat foods that are high in fiber, such as beans, whole grains, and fresh fruits and vegetables. Limit foods that are high in fat and processed sugars, such as fried or sweet foods. Activity Return to your normal activities as told by your health care provider. Ask your health care provider what activities are safe for you. Do not drive for 24 hours if you were given a sedative during your procedure. General instructions If your health care provider approves, you may take a warm bath to ease discomfort and burning. Drink enough fluid to keep your urine pale yellow. Your health care provider may recommend drinking two 8 oz (237 mL) glasses of water per hour for a few hours after your procedure. You may be asked to strain your urine  to collect any stone fragments that you pass. These fragments may be tested. Keep all follow-up visits as told by your health care provider. This is important. If you have a stent, you will need to return to your health care provider to have the stent removed. Contact a health care provider if you: Have pain or a burning feeling that lasts more than 2 days. Feel nauseous. Vomit more and more often. Have difficulty urinating. Have pain that gets worse or does not get better with medicine. Get help right away  if: You are unable to urinate, even if your bladder feels full. You have: Bright red blood or blood clots in your urine. More blood in your urine. Severe pain or discomfort. A fever or shaking chills. Abdominal pain. Difficulty breathing. Swelling in your legs. Summary After the procedure, it is common to have a burning sensation while urinating and small amounts of blood in your urine. Take over-the-counter and prescription medicines only as told by your health care provider. Drink enough fluid to keep your urine pale yellow. Keep all follow-up visits as told by your health care provider. This is important. This information is not intended to replace advice given to you by your health care provider. Make sure you discuss any questions you have with your health care provider. Document Revised: 12/10/2020 Document Reviewed: 12/10/2020 Elsevier Patient Education  2023 [employer]. General Anesthesia, Adult, Care After This sheet gives you information about how to care for yourself after your procedure. Your health care provider may also give you more specific instructions. If you have problems or questions, contact your health care provider. What can I expect after the procedure? After the procedure, the following side effects are common: Pain or discomfort at the IV site. Nausea. Vomiting. Sore throat. Trouble concentrating. Feeling cold or chills. Feeling weak or tired. Sleepiness and fatigue. Soreness and body aches. These side effects can affect parts of the body that were not involved in surgery. Follow these instructions at home: For the time period you were told by your health care provider:  Rest. Do not participate in activities where you could fall or become injured. Do not drive or use machinery. Do not drink alcohol. Do not take sleeping pills or medicines that cause drowsiness. Do not make important decisions or sign legal documents. Do not take care of children  on your own. Eating and drinking Follow any instructions from your health care provider about eating or drinking restrictions. When you feel hungry, start by eating small amounts of foods that are soft and easy to digest (bland), such as toast. Gradually return to your regular diet. Drink enough fluid to keep your urine pale yellow. If you vomit, rehydrate by drinking water, juice, or clear broth. General instructions If you have sleep apnea, surgery and certain medicines can increase your risk for breathing problems. Follow instructions from your health care provider about wearing your sleep device: Anytime you are sleeping, including during daytime naps. While taking prescription pain medicines, sleeping medicines, or medicines that make you drowsy. Have a responsible adult stay with you for the time you are told. It is important to have someone help care for you until you are awake and alert. Return to your normal activities as told by your health care provider. Ask your health care provider what activities are safe for you. Take over-the-counter and prescription medicines only as told by your health care provider. If you smoke, do not smoke without supervision. Keep all follow-up visits  as told by your health care provider. This is important. Contact a health care provider if: You have nausea or vomiting that does not get better with medicine. You cannot eat or drink without vomiting. You have pain that does not get better with medicine. You are unable to pass urine. You develop a skin rash. You have a fever. You have redness around your IV site that gets worse. Get help right away if: You have difficulty breathing. You have chest pain. You have blood in your urine or stool, or you vomit blood. Summary After the procedure, it is common to have a sore throat or nausea. It is also common to feel tired. Have a responsible adult stay with you for the time you are told. It is important to  have someone help care for you until you are awake and alert. When you feel hungry, start by eating small amounts of foods that are soft and easy to digest (bland), such as toast. Gradually return to your regular diet. Drink enough fluid to keep your urine pale yellow. Return to your normal activities as told by your health care provider. Ask your health care provider what activities are safe for you. This information is not intended to replace advice given to you by your health care provider. Make sure you discuss any questions you have with your health care provider. Document Revised: 12/22/2019 Document Reviewed: 07/21/2019 Elsevier Patient Education  2023 [employer].

## 2021-10-28 ENCOUNTER — Encounter (HOSPITAL_COMMUNITY)
Admission: RE | Admit: 2021-10-28 | Discharge: 2021-10-28 | Disposition: A | Discharge status: Home / Self Care | Payer: Medicare HMO | Source: Ambulatory Visit | Attending: Urology | Admitting: Urology

## 2021-10-28 VITALS — BP 130/73 | HR 94 | Temp 98.2°F | Resp 18 | Ht 71.0 in | Wt 170.0 lb

## 2021-10-28 DIAGNOSIS — Z01818 Encounter for other preprocedural examination: Secondary | ICD-10-CM | POA: Diagnosis not present

## 2021-10-28 DIAGNOSIS — N289 Disorder of kidney and ureter, unspecified: Secondary | ICD-10-CM | POA: Diagnosis not present

## 2021-10-28 DIAGNOSIS — I4891 Unspecified atrial fibrillation: Secondary | ICD-10-CM | POA: Diagnosis not present

## 2021-10-28 DIAGNOSIS — I10 Essential (primary) hypertension: Secondary | ICD-10-CM | POA: Diagnosis not present

## 2021-10-28 DIAGNOSIS — D649 Anemia, unspecified: Secondary | ICD-10-CM | POA: Insufficient documentation

## 2021-10-28 LAB — BASIC METABOLIC PANEL
Anion gap: 8 (ref 5–15)
BUN: 17 mg/dL (ref 8–23)
CO2: 22 mmol/L (ref 22–32)
Calcium: 9 mg/dL (ref 8.9–10.3)
Chloride: 105 mmol/L (ref 98–111)
Creatinine, Ser: 1.24 mg/dL (ref 0.61–1.24)
GFR, Estimated: 58 mL/min — ABNORMAL LOW (ref >60)
Glucose, Bld: 81 mg/dL (ref 70–99)
Potassium: 3.8 mmol/L (ref 3.5–5.1)
Sodium: 135 mmol/L (ref 135–145)

## 2021-10-28 LAB — CBC WITH DIFFERENTIAL/PLATELET
Abs Immature Granulocytes: 0.02 10*3/uL (ref 0.00–0.07)
Basophils Absolute: 0.1 10*3/uL (ref 0.0–0.1)
Basophils Relative: 1 %
Eosinophils Absolute: 0.1 10*3/uL (ref 0.0–0.5)
Eosinophils Relative: 1 %
HCT: 36.4 % — ABNORMAL LOW (ref 39.0–52.0)
Hemoglobin: 12.2 g/dL — ABNORMAL LOW (ref 13.0–17.0)
Immature Granulocytes: 0 %
Lymphocytes Relative: 22 %
Lymphs Abs: 1.9 10*3/uL (ref 0.7–4.0)
MCH: 30.9 pg (ref 26.0–34.0)
MCHC: 33.5 g/dL (ref 30.0–36.0)
MCV: 92.2 fL (ref 80.0–100.0)
Monocytes Absolute: 0.9 10*3/uL (ref 0.1–1.0)
Monocytes Relative: 11 %
Neutro Abs: 5.6 10*3/uL (ref 1.7–7.7)
Neutrophils Relative %: 65 %
Platelets: 182 10*3/uL (ref 150–400)
RBC: 3.95 MIL/uL — ABNORMAL LOW (ref 4.22–5.81)
RDW: 12.9 % (ref 11.5–15.5)
WBC: 8.6 10*3/uL (ref 4.0–10.5)
nRBC: 0 % (ref 0.0–0.2)

## 2021-10-31 ENCOUNTER — Ambulatory Visit (HOSPITAL_COMMUNITY)
Admission: RE | Admit: 2021-10-31 | Discharge: 2021-10-31 | Disposition: A | Discharge status: Home / Self Care | Payer: Medicare HMO | Attending: Urology | Admitting: Urology

## 2021-10-31 ENCOUNTER — Encounter (HOSPITAL_COMMUNITY)
Admission: RE | Disposition: A | Discharge status: Home / Self Care | Payer: Self-pay | Source: Home / Self Care | Attending: Urology

## 2021-10-31 ENCOUNTER — Encounter (HOSPITAL_COMMUNITY): Payer: Self-pay | Admitting: Urology

## 2021-10-31 ENCOUNTER — Ambulatory Visit (HOSPITAL_COMMUNITY): Payer: Medicare HMO | Admitting: Anesthesiology

## 2021-10-31 ENCOUNTER — Ambulatory Visit (HOSPITAL_BASED_OUTPATIENT_CLINIC_OR_DEPARTMENT_OTHER): Payer: Medicare HMO | Admitting: Anesthesiology

## 2021-10-31 DIAGNOSIS — I10 Essential (primary) hypertension: Secondary | ICD-10-CM | POA: Diagnosis not present

## 2021-10-31 DIAGNOSIS — N210 Calculus in bladder: Secondary | ICD-10-CM | POA: Diagnosis not present

## 2021-10-31 DIAGNOSIS — E039 Hypothyroidism, unspecified: Secondary | ICD-10-CM | POA: Diagnosis not present

## 2021-10-31 DIAGNOSIS — N35919 Unspecified urethral stricture, male, unspecified site: Secondary | ICD-10-CM

## 2021-10-31 DIAGNOSIS — I2510 Atherosclerotic heart disease of native coronary artery without angina pectoris: Secondary | ICD-10-CM | POA: Diagnosis not present

## 2021-10-31 DIAGNOSIS — I4891 Unspecified atrial fibrillation: Secondary | ICD-10-CM

## 2021-10-31 HISTORY — PX: CYSTOSCOPY WITH LITHOLAPAXY: SHX1425

## 2021-10-31 SURGERY — CYSTOSCOPY, WITH BLADDER CALCULUS LITHOLAPAXY
Anesthesia: General | Site: Ureter

## 2021-10-31 MED ORDER — BACITRACIN ZINC 500 UNIT/GM EX OINT
TOPICAL_OINTMENT | CUTANEOUS | Status: DC | PRN
  Administered 2021-10-31: 1 via TOPICAL

## 2021-10-31 MED ORDER — ESMOLOL HCL 100 MG/10ML IV SOLN
INTRAVENOUS | Status: DC | PRN
  Administered 2021-10-31: 10 mg via INTRAVENOUS

## 2021-10-31 MED ORDER — LABETALOL HCL 5 MG/ML IV SOLN
INTRAVENOUS | Status: AC
  Filled 2021-10-31: qty 4

## 2021-10-31 MED ORDER — SODIUM CHLORIDE 0.9 % IR SOLN
Status: DC | PRN
  Administered 2021-10-31: 3000 mL

## 2021-10-31 MED ORDER — FENTANYL CITRATE (PF) 100 MCG/2ML IJ SOLN
INTRAMUSCULAR | Status: AC
  Filled 2021-10-31: qty 2

## 2021-10-31 MED ORDER — FENTANYL CITRATE (PF) 100 MCG/2ML IJ SOLN
INTRAMUSCULAR | Status: DC | PRN
Start: 2021-10-31 — End: 2021-10-31
  Administered 2021-10-31: 50 ug via INTRAVENOUS
  Administered 2021-10-31: 25 ug via INTRAVENOUS
  Administered 2021-10-31: 50 ug via INTRAVENOUS

## 2021-10-31 MED ORDER — EPHEDRINE SULFATE-NACL 50-0.9 MG/10ML-% IV SOSY
PREFILLED_SYRINGE | INTRAVENOUS | Status: DC | PRN
  Administered 2021-10-31: 5 mg via INTRAVENOUS

## 2021-10-31 MED ORDER — ONDANSETRON HCL 4 MG/2ML IJ SOLN
4.0000 mg | Freq: Once | INTRAMUSCULAR | Status: AC | PRN
  Administered 2021-10-31: 4 mg via INTRAVENOUS
  Filled 2021-10-31: qty 2

## 2021-10-31 MED ORDER — FENTANYL CITRATE PF 50 MCG/ML IJ SOSY
25.0000 ug | PREFILLED_SYRINGE | INTRAMUSCULAR | Status: DC | PRN
  Administered 2021-10-31 (×2): 50 ug via INTRAVENOUS
  Filled 2021-10-31 (×2): qty 1

## 2021-10-31 MED ORDER — PROPOFOL 10 MG/ML IV BOLUS
INTRAVENOUS | Status: DC | PRN
  Administered 2021-10-31: 150 mg via INTRAVENOUS

## 2021-10-31 MED ORDER — HYDROCODONE-ACETAMINOPHEN 5-325 MG PO TABS: 1.0000 | ORAL_TABLET | Freq: Four times a day (QID) | ORAL | 0 refills | Status: DC | PRN

## 2021-10-31 MED ORDER — CHLORHEXIDINE GLUCONATE 0.12 % MT SOLN
15.0000 mL | Freq: Once | OROMUCOSAL | Status: AC
  Administered 2021-10-31: 15 mL via OROMUCOSAL

## 2021-10-31 MED ORDER — LIDOCAINE 2% (20 MG/ML) 5 ML SYRINGE
INTRAMUSCULAR | Status: DC | PRN
  Administered 2021-10-31: 100 mg via INTRAVENOUS

## 2021-10-31 MED ORDER — PHENYLEPHRINE 80 MCG/ML (10ML) SYRINGE FOR IV PUSH (FOR BLOOD PRESSURE SUPPORT)
PREFILLED_SYRINGE | INTRAVENOUS | Status: DC | PRN
  Administered 2021-10-31: 80 ug via INTRAVENOUS
  Administered 2021-10-31: 160 ug via INTRAVENOUS
  Administered 2021-10-31 (×4): 80 ug via INTRAVENOUS

## 2021-10-31 MED ORDER — CEFAZOLIN SODIUM-DEXTROSE 2-4 GM/100ML-% IV SOLN
2.0000 g | INTRAVENOUS | Status: AC
  Administered 2021-10-31: 2 g via INTRAVENOUS
  Filled 2021-10-31: qty 100

## 2021-10-31 MED ORDER — WATER FOR IRRIGATION, STERILE IR SOLN
Status: DC | PRN
  Administered 2021-10-31: 500 mL

## 2021-10-31 MED ORDER — LACTATED RINGERS IV SOLN
INTRAVENOUS | Status: DC
  Administered 2021-10-31: 1000 mL via INTRAVENOUS

## 2021-10-31 MED ORDER — TRIPLE ANTIBIOTIC 3.5-400-5000 EX OINT
TOPICAL_OINTMENT | CUTANEOUS | Status: AC
  Filled 2021-10-31: qty 1

## 2021-10-31 MED ORDER — ORAL CARE MOUTH RINSE
15.0000 mL | Freq: Once | OROMUCOSAL | Status: AC
Start: 2021-10-31 — End: 2021-10-31

## 2021-10-31 MED ORDER — HYDROCODONE-ACETAMINOPHEN 7.5-325 MG PO TABS
1.0000 | ORAL_TABLET | Freq: Once | ORAL | Status: AC | PRN
  Administered 2021-10-31: 1 via ORAL
  Filled 2021-10-31: qty 1

## 2021-10-31 MED ORDER — BACITRACIN ZINC 500 UNIT/GM EX OINT
TOPICAL_OINTMENT | CUTANEOUS | Status: AC
  Filled 2021-10-31: qty 0.9

## 2021-10-31 SURGICAL SUPPLY — 23 items
BAG DRAIN URO TABLE W/ADPT NS (BAG) ×4 IMPLANT
BAG DRN 8 ADPR NS SKTRN CSTL (BAG) ×3
BAG DRN RND TRDRP ANRFLXCHMBR (UROLOGICAL SUPPLIES) ×3
BAG HAMPER (MISCELLANEOUS) ×4 IMPLANT
BAG URINE DRAIN 2000ML AR STRL (UROLOGICAL SUPPLIES) ×4 IMPLANT
CATH FOLEY 2W COUNCIL 5CC 16FR (CATHETERS) ×2 IMPLANT
CATH FOLEY 2W COUNCIL 5CC 18FR (CATHETERS) ×2 IMPLANT
CATH FOLEY 2WAY SLVR  5CC 18FR (CATHETERS) ×1
CATH FOLEY 2WAY SLVR 5CC 18FR (CATHETERS) ×3 IMPLANT
GAUZE SPONGE 4X4 12PLY STRL (GAUZE/BANDAGES/DRESSINGS) ×2 IMPLANT
GAUZE XEROFORM 1X8 LF (GAUZE/BANDAGES/DRESSINGS) ×2 IMPLANT
GLOVE BIO SURGEON STRL SZ8 (GLOVE) ×4 IMPLANT
GLOVE BIOGEL PI IND STRL 7.0 (GLOVE) ×6 IMPLANT
GLOVE BIOGEL PI INDICATOR 7.0 (GLOVE) ×2
GOWN STRL REUS W/TWL LRG LVL3 (GOWN DISPOSABLE) ×4 IMPLANT
GOWN STRL REUS W/TWL XL LVL3 (GOWN DISPOSABLE) ×4 IMPLANT
GUIDEWIRE STR DUAL SENSOR (WIRE) ×2 IMPLANT
IV NS IRRIG 3000ML ARTHROMATIC (IV SOLUTION) ×4 IMPLANT
KIT TURNOVER CYSTO (KITS) ×4 IMPLANT
PACK CYSTO (CUSTOM PROCEDURE TRAY) ×4 IMPLANT
PAD ARMBOARD 7.5X6 YLW CONV (MISCELLANEOUS) ×4 IMPLANT
SURGILUBE 2OZ TUBE FLIPTOP (MISCELLANEOUS) ×2 IMPLANT
WATER STERILE IRR 500ML POUR (IV SOLUTION) ×4 IMPLANT

## 2021-10-31 NOTE — Interval H&P Note (Signed)
History and Physical Interval Note:  10/31/2021 3:31 PM  Clayton Austin  has presented today for surgery, with the diagnosis of bladder calculi.  The various methods of treatment have been discussed with the patient and family. After consideration of risks, benefits and other options for treatment, the patient has consented to  Procedure(s): CYSTOSCOPY WITH LITHOLAPAXY (N/A) HOLMIUM LASER APPLICATION (N/A) as a surgical intervention.  The patient's history has been reviewed, patient examined, no change in status, stable for surgery.  I have reviewed the patient's chart and labs.  Questions were answered to the patient's satisfaction.     [name]

## 2021-10-31 NOTE — Transfer of Care (Signed)
Immediate Anesthesia Transfer of Care Note  Patient: Clayton Austin  Procedure(s) Performed: CYSTOSCOPY WITH LITHOLAPAXY (Bladder) URETHRAL DILATION UNDER ANESTHESIA (Ureter)  Patient Location: PACU  Anesthesia Type:General  Level of Consciousness: drowsy and patient cooperative  Airway & Oxygen Therapy: Patient Spontanous Breathing and Patient connected to nasal cannula oxygen  Post-op Assessment: Report given to RN and Post -op Vital signs reviewed and stable  Post vital signs: Reviewed and stable  Last Vitals:  Vitals Value Taken Time  BP 118/76 10/31/21 1715  Temp    Pulse 88 10/31/21 1720  Resp 11 10/31/21 1720  SpO2 93 % 10/31/21 1720  Vitals shown include unvalidated device data.  Last Pain:  Vitals:   10/31/21 1320  TempSrc: (P) Oral  PainSc: (P) 0-No pain      Patients Stated Pain Goal: (P) 6 (10/31/21 1320)  Complications: No notable events documented.

## 2021-10-31 NOTE — Anesthesia Procedure Notes (Signed)
Procedure Name: LMA Insertion Date/Time: 10/31/2021 4:08 PM  Performed by: Marny Lowenstein, CRNAPre-anesthesia Checklist: Patient identified, Emergency Drugs available, Suction available and Patient being monitored Patient Re-evaluated:Patient Re-evaluated prior to induction Oxygen Delivery Method: Circle system utilized Preoxygenation: Pre-oxygenation with 100% oxygen Induction Type: IV induction Ventilation: Mask ventilation without difficulty LMA: LMA inserted LMA Size: 5.0 Number of attempts: 1 Placement Confirmation: positive ETCO2 and breath sounds checked- equal and bilateral Tube secured with: Tape Dental Injury: Teeth and Oropharynx as per pre-operative assessment

## 2021-10-31 NOTE — Op Note (Signed)
Preoperative diagnosis: urethral stricture, bladder calculi  Postoperative diagnosis: same  Procedure: 1 cystoscopy 2. cystolithalopaxy for a stone greater than 2.5cm 3. Urethral Dilation  Attending: [name]  Anesthesia: General  Estimated blood loss: Minimal  Drains: 16 french foley  Specimens: 1. Bladder calculus  Antibiotics: ancef  Findings: Pan urethral stricture of penile urethra. Large urethral diverticulum at bulbar urethra filled with calculi. Ureteral orifices in normal anatomic location.  3cm bladder calculus  Indications: Patient is a 81 year old male with a history of urethral stricture and bladder calculi.  After discussing treatment options, they decided proceed with cystolithalopaxy.  Procedure in detail: The patient was brought to the operating room and a brief timeout was done to ensure correct patient, correct procedure, correct site.  General anesthesia was administered patient was placed in dorsal lithotomy position.  Their genitalia was then prepped and draped in usual sterile fashion.  Using a flexible cystoscope we passed a wire into the bladder. We then used serial dilators to dilate the penile urethra from 8 french to 20 french. A rigid 22 French cystoscope was passed in the urethra and to a large diverticulum at the bulbar urethra. The diverticulum was filled with calculi which were irrigated from the diverticulum. We then passed the cystoscope in to the bladder.  Bladder was inspected and we noted no masses or lesions.  the ureteral orifices were in the normal orthotopic locations. Using a stone crusher the stones were fragmented and then irrigated from the bladder.The stone fragments were the sent for analysis. We then placed a 16 french foley. The bladder was then drained and this concluded the procedure which was well tolerated by patient.  Complications: None  Condition: Stable, extubated, transferred to PACU  Plan: Patient is to be discharge  home. He will followup in 1 week for voiding trial

## 2021-10-31 NOTE — Anesthesia Preprocedure Evaluation (Addendum)
Anesthesia Evaluation  Patient identified by MRN, date of birth, ID band Patient awake    Reviewed: Allergy & Precautions, NPO status , Patient's Chart, lab work & pertinent test results  Airway Mallampati: II  TM Distance: >3 FB Neck ROM: Full    Dental  (+) Partial Upper,    Pulmonary neg pulmonary ROS, former smoker,    Pulmonary exam normal breath sounds clear to auscultation       Cardiovascular Exercise Tolerance: Good hypertension, + CAD, + Past MI and + CABG  Normal cardiovascular exam+ dysrhythmias (RBBB) Atrial Fibrillation  Rhythm:Regular Rate:Normal  Stress Findings The patient exercised following the Bruce protocol.   The patient reported shortness of breath during the stress test. The patient experienced no angina during the stress test.   The test was stopped because  the patient complained of fatigue and shortness of breath. Test was stopped per protocol.    Heart rate demonstrated a normal response to exercise. Blood pressure demonstrated a hypertensive response to exercise. Overall, the patient's exercise capacity was normal.   85% of maximum heart rate was achieved after 1.3 minutes.  Recovery time:  6.5 minutes.  The patient's response to exercise was adequate for diagnosis.     Neuro/Psych negative neurological ROS  negative psych ROS   GI/Hepatic Neg liver ROS, GERD  Medicated,  Endo/Other  Hypothyroidism   Renal/GU CRFRenal disease  negative genitourinary   Musculoskeletal negative musculoskeletal ROS (+)   Abdominal   Peds negative pediatric ROS (+)  Hematology  (+) Blood dyscrasia, anemia ,   Anesthesia Other Findings   Reproductive/Obstetrics negative OB ROS                             Anesthesia Physical  Anesthesia Plan  ASA: III  Anesthesia Plan: General   Post-op Pain Management:    Induction: Intravenous  PONV Risk Score and Plan:   Airway  Management Planned: LMA and Oral ETT  Additional Equipment:   Intra-op Plan:   Post-operative Plan: Extubation in OR  Informed Consent: I have reviewed the patients History and Physical, chart, labs and discussed the procedure including the risks, benefits and alternatives for the proposed anesthesia with the patient or authorized representative who has indicated his/her understanding and acceptance.     Dental advisory given  Plan Discussed with: CRNA  Anesthesia Plan Comments:         Anesthesia Quick Evaluation

## 2021-11-01 NOTE — Anesthesia Postprocedure Evaluation (Signed)
Anesthesia Post Note  Patient: MARTINEZ BOXX  Procedure(s) Performed: CYSTOSCOPY WITH LITHOLAPAXY (Bladder) URETHRAL DILATION UNDER ANESTHESIA (Ureter)  Patient location during evaluation: PACU Anesthesia Type: General Level of consciousness: awake and alert Pain management: pain level controlled Vital Signs Assessment: post-procedure vital signs reviewed and stable Respiratory status: spontaneous breathing, nonlabored ventilation, respiratory function stable and patient connected to nasal cannula oxygen Cardiovascular status: blood pressure returned to baseline and stable Postop Assessment: no apparent nausea or vomiting Anesthetic complications: no   There were no known notable events for this encounter.   Last Vitals:  Vitals:   10/31/21 1745 10/31/21 1801  BP: 134/80 128/83  Pulse:  90  Resp: 14 15  Temp:  36.4 C  SpO2: 97% 99%    Last Pain:  Vitals:   11/01/21 1211  TempSrc:   PainSc: 6                  Glynis Smiles

## 2021-11-04 ENCOUNTER — Encounter (HOSPITAL_COMMUNITY): Payer: Self-pay | Admitting: Urology

## 2021-11-06 ENCOUNTER — Ambulatory Visit (INDEPENDENT_AMBULATORY_CARE_PROVIDER_SITE_OTHER): Payer: Medicare HMO | Admitting: Urology

## 2021-11-06 VITALS — BP 177/97 | HR 67 | Ht 71.0 in | Wt 170.0 lb

## 2021-11-06 DIAGNOSIS — N401 Enlarged prostate with lower urinary tract symptoms: Secondary | ICD-10-CM

## 2021-11-06 DIAGNOSIS — R339 Retention of urine, unspecified: Secondary | ICD-10-CM

## 2021-11-06 DIAGNOSIS — N138 Other obstructive and reflux uropathy: Secondary | ICD-10-CM

## 2021-11-06 LAB — BLADDER SCAN AMB NON-IMAGING: Scan Result: 5

## 2021-11-06 NOTE — Progress Notes (Signed)
11/06/2021 11:11 AM   Clayton Austin [birth year] [id number]  Referring provider: Benetta Spar, MD [address] [place],  [place] [address]  Followup after cystlithalopaxy   HPI: Clayton Austin is a 81yo here for followup after cystolithalopaxy and urethral dilation. He was able to void 60cc of dark urine. He denies dysuria or hematuria. No other complaints today.    PMH: Past Medical History:  Diagnosis Date   Benign prostatic hypertrophy    BPH (benign prostatic hypertrophy)    Coronary artery disease cardiologist-  Clayton Austin   a. NSTEMI 07/2012 => critical LM disease on LHC => s/p CABG (L-LAD/D1, S-OM2, S-PDA/PL);  EF 55-60% at [facility] 07/2012   Exercise tolerance test normal    Dec 2014  per Clayton Austin (cardiologist) note [facility] treadmilll score 5.5 (low risk)   GERD (gastroesophageal reflux disease)    History of atrial fibrillation    post-op  CABG 04/ 2014   History of non-ST elevation myocardial infarction (NSTEMI)    04/ 2014   Hyperlipidemia    Hypertension    Hypothyroidism    Ischemic heart disease    STABLE PER Clayton Austin (CARDIOLOGIST) NOTE 06-18-2015   Lower urinary tract symptoms (LUTS)    RBBB (right bundle branch block)    S/P CABG x 5    08-17-2012   LIMA sequentilly to LAD and Diagonal,  SVG to OM,  SVG seq to PDA and PLB with EVH from right thigh   Urethral stricture    recurrent   Wears dentures    UPPER   Wears glasses     Surgical History: Past Surgical History:  Procedure Laterality Date   CORONARY ARTERY BYPASS GRAFT N/A 08/17/2012   Procedure: CORONARY ARTERY BYPASS GRAFTING (CABG);  Surgeon: Clayton Slot, MD;  Location: [facility] OR;  Service: Open Heart Surgery;  Laterality: N/A;  L-LAD/D1, S-OM2,  S-PDA/PL   CYSTOSCOPY N/A 08/17/2012   Procedure: CYSTOSCOPY FLEXIBLE for difficult  foley catheter insertion;  Surgeon: Clayton Ache, MD;  Location: [facility] OR;  Service: Urology;  Laterality: N/A;   CYSTOSCOPY N/A 01/16/2016    Procedure: CYSTOSCOPY FLEXIBLE;  Surgeon: Clayton Ache, MD;  Location: [facility];  Service: Urology;  Laterality: N/A;   CYSTOSCOPY WITH LITHOLAPAXY N/A 10/31/2021   Procedure: CYSTOSCOPY WITH LITHOLAPAXY;  Surgeon: Clayton Gauze, MD;  Location: [facility];  Service: Urology;  Laterality: N/A;   CYSTOSCOPY WITH URETHRAL DILATATION N/A 01/16/2016   Procedure: CYSTOSCOPY WITH URETHRAL BALLOON DILATATION;  Surgeon: Clayton Ache, MD;  Location: [facility];  Service: Urology;  Laterality: N/A;   LEFT HEART CATHETERIZATION WITH CORONARY ANGIOGRAM N/A 08/16/2012   Procedure: LEFT HEART CATHETERIZATION WITH CORONARY ANGIOGRAM;  Surgeon: Clayton Bollman, MD;  Location: [facility] CATH LAB;  Service: Cardiovascular;  Laterality: N/A;  NSTEMI;  critical LM stenosis extending into the ostial LAD & LCFX,  moderately tight RCA stenosis,  Normal LVF, ef 55-60%   TRANSURETHRAL RESECTION OF PROSTATE  06/2012    Home Medications:  Allergies as of 11/06/2021   No Known Allergies      Medication List        Accurate as of November 06, 2021 11:11 AM. If you have any questions, ask your nurse or doctor.          amLODipine 10 MG tablet Commonly known as: NORVASC Take 10 mg by mouth every morning.   aspirin EC 81 MG tablet Take 1 tablet (81 mg total) by mouth daily.  atorvastatin 20 MG tablet Commonly known as: LIPITOR Take 20 mg by mouth every morning.   finasteride 5 MG tablet Commonly known as: PROSCAR Take 1 tablet (5 mg total) by mouth daily.   HYDROcodone-acetaminophen 5-325 MG tablet Commonly known as: Norco Take 1 tablet by mouth every 6 (six) hours as needed for moderate pain.   levothyroxine 75 MCG tablet Commonly known as: SYNTHROID Take 75 mcg by mouth daily.   losartan 25 MG tablet Commonly known as: COZAAR Take 1 tablet by mouth daily.   metoprolol tartrate 25 MG tablet Commonly known as: LOPRESSOR Take 1 tablet (25 mg total) by mouth 2 (two)  times daily.   omeprazole 20 MG capsule Commonly known as: PRILOSEC Take 20 mg by mouth every morning.   tamsulosin 0.4 MG Caps capsule Commonly known as: FLOMAX Take 1 capsule (0.4 mg total) by mouth 2 (two) times daily.        Allergies: No Known Allergies  Family History: Family History  Problem Relation Age of Onset   Coronary artery disease Father 7    Social History:  reports that he quit smoking about 51 years ago. His smoking use included cigarettes. He started smoking about 61 years ago. He has a 5.00 pack-year smoking history. He has never used smokeless tobacco. He reports that he does not drink alcohol and does not use drugs.  ROS: All other review of systems were reviewed and are negative except what is noted above in HPI  Physical Exam: There were no vitals taken for this visit.  Constitutional:  Alert and oriented, No acute distress. HEENT: [place] AT, moist mucus membranes.  Trachea midline, no masses. Cardiovascular: No clubbing, cyanosis, or edema. Respiratory: Normal respiratory effort, no increased work of breathing. GI: Abdomen is soft, nontender, nondistended, no abdominal masses GU: No CVA tenderness.  Lymph: No cervical or inguinal lymphadenopathy. Skin: No rashes, bruises or suspicious lesions. Neurologic: Grossly intact, no focal deficits, moving all 4 extremities. Psychiatric: Normal mood and affect.  Laboratory Data: Lab Results  Component Value Date   WBC 8.6 10/28/2021   HGB 12.2 (L) 10/28/2021   HCT 36.4 (L) 10/28/2021   MCV 92.2 10/28/2021   PLT 182 10/28/2021    Lab Results  Component Value Date   CREATININE 1.24 10/28/2021    No results found for: "PSA"  No results found for: "TESTOSTERONE"  Lab Results  Component Value Date   HGBA1C 5.4 08/17/2012    Urinalysis    Component Value Date/Time   COLORURINE RED (A) 05/04/2016 1053   APPEARANCEUR Cloudy (A) 10/09/2021 1453   LABSPEC 1.025 05/04/2016 1053   PHURINE 6.0  05/04/2016 1053   GLUCOSEU Negative 10/09/2021 1453   HGBUR LARGE (A) 05/04/2016 1053   BILIRUBINUR Negative 10/09/2021 1453   KETONESUR NEGATIVE 05/04/2016 1053   PROTEINUR 1+ (A) 10/09/2021 1453   PROTEINUR 100 (A) 05/04/2016 1053   UROBILINOGEN 1.0 08/17/2012 0408   NITRITE Negative 10/09/2021 1453   NITRITE NEGATIVE 05/04/2016 1053   LEUKOCYTESUR 3+ (A) 10/09/2021 1453    Lab Results  Component Value Date   LABMICR See below: 10/09/2021   WBCUA 11-30 (A) 10/09/2021   LABEPIT 0-10 10/09/2021   MUCUS Present 10/09/2021   BACTERIA Few 10/09/2021    Pertinent Imaging:  Results for orders placed during the hospital encounter of 08/25/12  DG Abd 1 View  Narrative *RADIOLOGY REPORT*  Clinical Data: Abdominal pain and vomiting.  Elevated white count.  ABDOMEN - 1 VIEW  Comparison: None  Findings: Mildly distended gas filled loops of small bowel are identified within the central abdomen. Stool and gas in the colon and rectum are noted. No suspicious calcifications are identified. Catheter overlying the pelvis is noted. Degenerative changes of the lower lumbar spine and hips noted. No acute bony abnormalities are present.  IMPRESSION: Nonspecific bowel gas pattern - small bowel obstruction is not excluded.  Consider further evaluation with CT as indicated.   Original Report Authenticated By: Harmon Pier, M.D.  No results found for this or any previous visit.  No results found for this or any previous visit.  No results found for this or any previous visit.  No results found for this or any previous visit.  No results found for this or any previous visit.  No results found for this or any previous visit.  No results found for this or any previous visit.   Assessment & Plan:    1. Benign prostatic hyperplasia with urinary obstruction -RTC 1 week with PVR  2. Urine retention -RTC 1 week with PVR   No follow-ups on file.  [name], MD  [facility] [place]

## 2021-11-06 NOTE — Progress Notes (Signed)
Fill and Pull Catheter Removal  Patient is present today for a catheter removal.  Patient was cleaned and prepped in a sterile fashion 54ml of sterile water/ saline was instilled into the bladder when the patient felt the urge to urinate. 67ml of water was then drained from the balloon.  A 16FR foley cath was removed from the bladder no complications were noted .  Patient as then given some time to void on their own.  Patient can void  36ml on their own after some time.  Patient tolerated well.  Performed by: Len Childs  Follow up/ Additional notes: Follow up as scheduled.    post void residual =53ml

## 2021-11-08 LAB — CALCULI, WITH PHOTOGRAPH (CLINICAL LAB)
Ammonium Acid Urate Calculi: 30 %
Carbonate Apatite: 20 %
Mg NH4 PO4 (Struvite): 50 %
Weight Calculi: 1883 mg

## 2021-11-11 DIAGNOSIS — R600 Localized edema: Secondary | ICD-10-CM | POA: Diagnosis not present

## 2021-11-11 DIAGNOSIS — M1990 Unspecified osteoarthritis, unspecified site: Secondary | ICD-10-CM | POA: Diagnosis not present

## 2021-11-11 DIAGNOSIS — I2510 Atherosclerotic heart disease of native coronary artery without angina pectoris: Secondary | ICD-10-CM | POA: Diagnosis not present

## 2021-11-11 DIAGNOSIS — I10 Essential (primary) hypertension: Secondary | ICD-10-CM | POA: Diagnosis not present

## 2021-11-12 ENCOUNTER — Encounter: Payer: Self-pay | Admitting: Urology

## 2021-11-12 NOTE — Patient Instructions (Signed)

## 2021-11-26 DIAGNOSIS — Z789 Other specified health status: Secondary | ICD-10-CM | POA: Diagnosis not present

## 2021-11-26 DIAGNOSIS — I2510 Atherosclerotic heart disease of native coronary artery without angina pectoris: Secondary | ICD-10-CM | POA: Diagnosis not present

## 2021-11-26 DIAGNOSIS — M1990 Unspecified osteoarthritis, unspecified site: Secondary | ICD-10-CM | POA: Diagnosis not present

## 2021-11-26 DIAGNOSIS — E039 Hypothyroidism, unspecified: Secondary | ICD-10-CM | POA: Diagnosis not present

## 2021-11-26 DIAGNOSIS — I10 Essential (primary) hypertension: Secondary | ICD-10-CM | POA: Diagnosis not present

## 2021-12-09 ENCOUNTER — Ambulatory Visit: Payer: Medicare HMO | Admitting: Physician Assistant

## 2021-12-13 ENCOUNTER — Ambulatory Visit: Payer: Medicare HMO | Admitting: Urology

## 2021-12-28 DIAGNOSIS — E039 Hypothyroidism, unspecified: Secondary | ICD-10-CM | POA: Diagnosis not present

## 2021-12-28 DIAGNOSIS — I10 Essential (primary) hypertension: Secondary | ICD-10-CM | POA: Diagnosis not present

## 2022-01-02 DIAGNOSIS — G8929 Other chronic pain: Secondary | ICD-10-CM | POA: Diagnosis not present

## 2022-01-02 DIAGNOSIS — M25561 Pain in right knee: Secondary | ICD-10-CM | POA: Diagnosis not present

## 2022-01-02 DIAGNOSIS — M79641 Pain in right hand: Secondary | ICD-10-CM | POA: Diagnosis not present

## 2022-01-02 DIAGNOSIS — M25551 Pain in right hip: Secondary | ICD-10-CM | POA: Diagnosis not present

## 2022-01-02 DIAGNOSIS — M160 Bilateral primary osteoarthritis of hip: Secondary | ICD-10-CM | POA: Diagnosis not present

## 2022-01-02 DIAGNOSIS — G5603 Carpal tunnel syndrome, bilateral upper limbs: Secondary | ICD-10-CM | POA: Diagnosis not present

## 2022-01-26 DIAGNOSIS — I10 Essential (primary) hypertension: Secondary | ICD-10-CM | POA: Diagnosis not present

## 2022-01-26 DIAGNOSIS — I2510 Atherosclerotic heart disease of native coronary artery without angina pectoris: Secondary | ICD-10-CM | POA: Diagnosis not present

## 2022-01-30 DIAGNOSIS — Z0100 Encounter for examination of eyes and vision without abnormal findings: Secondary | ICD-10-CM | POA: Diagnosis not present

## 2022-01-30 DIAGNOSIS — H524 Presbyopia: Secondary | ICD-10-CM | POA: Diagnosis not present

## 2022-01-30 DIAGNOSIS — H2510 Age-related nuclear cataract, unspecified eye: Secondary | ICD-10-CM | POA: Diagnosis not present

## 2022-01-30 DIAGNOSIS — H353131 Nonexudative age-related macular degeneration, bilateral, early dry stage: Secondary | ICD-10-CM | POA: Diagnosis not present

## 2022-02-26 DIAGNOSIS — I2510 Atherosclerotic heart disease of native coronary artery without angina pectoris: Secondary | ICD-10-CM | POA: Diagnosis not present

## 2022-02-26 DIAGNOSIS — I10 Essential (primary) hypertension: Secondary | ICD-10-CM | POA: Diagnosis not present

## 2022-03-28 DIAGNOSIS — N400 Enlarged prostate without lower urinary tract symptoms: Secondary | ICD-10-CM | POA: Diagnosis not present

## 2022-03-28 DIAGNOSIS — I10 Essential (primary) hypertension: Secondary | ICD-10-CM | POA: Diagnosis not present

## 2022-04-28 DIAGNOSIS — I2510 Atherosclerotic heart disease of native coronary artery without angina pectoris: Secondary | ICD-10-CM | POA: Diagnosis not present

## 2022-04-28 DIAGNOSIS — I10 Essential (primary) hypertension: Secondary | ICD-10-CM | POA: Diagnosis not present

## 2022-05-29 ENCOUNTER — Telehealth: Payer: Self-pay

## 2022-05-29 NOTE — Telephone Encounter (Signed)
Patient called stating he is having difficulty voiding. Patient informed that the office close at 5pm, if he was able to get her by 4:30 he can be seen. Patient states he does not have a way to come to the office.  Patient informed that if he is unable to void he will have to go the he ER due to the office being closed.  Patient scheduled to come see Dr. Alyson Ingles on Monday.

## 2022-06-02 ENCOUNTER — Ambulatory Visit: Payer: Medicare HMO | Admitting: Urology

## 2022-06-02 DIAGNOSIS — R339 Retention of urine, unspecified: Secondary | ICD-10-CM

## 2022-06-19 DIAGNOSIS — E785 Hyperlipidemia, unspecified: Secondary | ICD-10-CM | POA: Diagnosis not present

## 2022-06-19 DIAGNOSIS — I10 Essential (primary) hypertension: Secondary | ICD-10-CM | POA: Diagnosis not present

## 2022-06-19 DIAGNOSIS — M1990 Unspecified osteoarthritis, unspecified site: Secondary | ICD-10-CM | POA: Diagnosis not present

## 2022-06-19 DIAGNOSIS — Z0001 Encounter for general adult medical examination with abnormal findings: Secondary | ICD-10-CM | POA: Diagnosis not present

## 2022-06-19 DIAGNOSIS — Z1389 Encounter for screening for other disorder: Secondary | ICD-10-CM | POA: Diagnosis not present

## 2022-06-19 DIAGNOSIS — Z1331 Encounter for screening for depression: Secondary | ICD-10-CM | POA: Diagnosis not present

## 2022-06-19 DIAGNOSIS — I2510 Atherosclerotic heart disease of native coronary artery without angina pectoris: Secondary | ICD-10-CM | POA: Diagnosis not present

## 2022-06-19 DIAGNOSIS — N400 Enlarged prostate without lower urinary tract symptoms: Secondary | ICD-10-CM | POA: Diagnosis not present

## 2022-06-19 DIAGNOSIS — E039 Hypothyroidism, unspecified: Secondary | ICD-10-CM | POA: Diagnosis not present

## 2022-07-18 DIAGNOSIS — I10 Essential (primary) hypertension: Secondary | ICD-10-CM | POA: Diagnosis not present

## 2022-07-18 DIAGNOSIS — I2510 Atherosclerotic heart disease of native coronary artery without angina pectoris: Secondary | ICD-10-CM | POA: Diagnosis not present

## 2022-08-02 DIAGNOSIS — E785 Hyperlipidemia, unspecified: Secondary | ICD-10-CM | POA: Diagnosis not present

## 2022-08-02 DIAGNOSIS — R079 Chest pain, unspecified: Secondary | ICD-10-CM | POA: Diagnosis not present

## 2022-08-02 DIAGNOSIS — R7989 Other specified abnormal findings of blood chemistry: Secondary | ICD-10-CM | POA: Diagnosis not present

## 2022-08-02 DIAGNOSIS — K5900 Constipation, unspecified: Secondary | ICD-10-CM | POA: Insufficient documentation

## 2022-08-02 DIAGNOSIS — N401 Enlarged prostate with lower urinary tract symptoms: Secondary | ICD-10-CM | POA: Diagnosis not present

## 2022-08-02 DIAGNOSIS — N390 Urinary tract infection, site not specified: Secondary | ICD-10-CM | POA: Diagnosis not present

## 2022-08-02 DIAGNOSIS — R339 Retention of urine, unspecified: Secondary | ICD-10-CM | POA: Diagnosis not present

## 2022-08-02 DIAGNOSIS — R338 Other retention of urine: Secondary | ICD-10-CM | POA: Diagnosis not present

## 2022-08-02 DIAGNOSIS — I4891 Unspecified atrial fibrillation: Secondary | ICD-10-CM | POA: Insufficient documentation

## 2022-08-02 DIAGNOSIS — I10 Essential (primary) hypertension: Secondary | ICD-10-CM | POA: Diagnosis not present

## 2022-08-03 DIAGNOSIS — I10 Essential (primary) hypertension: Secondary | ICD-10-CM | POA: Diagnosis not present

## 2022-08-03 DIAGNOSIS — E039 Hypothyroidism, unspecified: Secondary | ICD-10-CM | POA: Diagnosis not present

## 2022-08-03 DIAGNOSIS — R7989 Other specified abnormal findings of blood chemistry: Secondary | ICD-10-CM | POA: Diagnosis not present

## 2022-08-03 DIAGNOSIS — R339 Retention of urine, unspecified: Secondary | ICD-10-CM | POA: Diagnosis not present

## 2022-08-03 DIAGNOSIS — N401 Enlarged prostate with lower urinary tract symptoms: Secondary | ICD-10-CM | POA: Diagnosis not present

## 2022-08-03 DIAGNOSIS — N400 Enlarged prostate without lower urinary tract symptoms: Secondary | ICD-10-CM | POA: Diagnosis not present

## 2022-08-03 DIAGNOSIS — K5900 Constipation, unspecified: Secondary | ICD-10-CM | POA: Diagnosis not present

## 2022-08-03 DIAGNOSIS — N390 Urinary tract infection, site not specified: Secondary | ICD-10-CM | POA: Diagnosis not present

## 2022-08-03 DIAGNOSIS — Z87891 Personal history of nicotine dependence: Secondary | ICD-10-CM | POA: Diagnosis not present

## 2022-08-03 DIAGNOSIS — I480 Paroxysmal atrial fibrillation: Secondary | ICD-10-CM | POA: Diagnosis not present

## 2022-08-03 DIAGNOSIS — B9689 Other specified bacterial agents as the cause of diseases classified elsewhere: Secondary | ICD-10-CM | POA: Diagnosis not present

## 2022-08-03 DIAGNOSIS — I214 Non-ST elevation (NSTEMI) myocardial infarction: Secondary | ICD-10-CM | POA: Diagnosis not present

## 2022-08-03 DIAGNOSIS — R338 Other retention of urine: Secondary | ICD-10-CM | POA: Diagnosis not present

## 2022-08-03 DIAGNOSIS — E785 Hyperlipidemia, unspecified: Secondary | ICD-10-CM | POA: Diagnosis not present

## 2022-08-04 DIAGNOSIS — R7989 Other specified abnormal findings of blood chemistry: Secondary | ICD-10-CM | POA: Diagnosis not present

## 2022-08-04 DIAGNOSIS — I358 Other nonrheumatic aortic valve disorders: Secondary | ICD-10-CM | POA: Diagnosis not present

## 2022-08-04 DIAGNOSIS — Z951 Presence of aortocoronary bypass graft: Secondary | ICD-10-CM | POA: Diagnosis not present

## 2022-08-04 DIAGNOSIS — D72829 Elevated white blood cell count, unspecified: Secondary | ICD-10-CM | POA: Diagnosis not present

## 2022-08-04 DIAGNOSIS — N401 Enlarged prostate with lower urinary tract symptoms: Secondary | ICD-10-CM | POA: Diagnosis not present

## 2022-08-04 DIAGNOSIS — R338 Other retention of urine: Secondary | ICD-10-CM | POA: Diagnosis not present

## 2022-08-04 DIAGNOSIS — E785 Hyperlipidemia, unspecified: Secondary | ICD-10-CM | POA: Diagnosis not present

## 2022-08-04 DIAGNOSIS — K5900 Constipation, unspecified: Secondary | ICD-10-CM | POA: Diagnosis not present

## 2022-08-04 DIAGNOSIS — R911 Solitary pulmonary nodule: Secondary | ICD-10-CM | POA: Diagnosis not present

## 2022-08-04 DIAGNOSIS — N390 Urinary tract infection, site not specified: Secondary | ICD-10-CM | POA: Diagnosis not present

## 2022-08-04 DIAGNOSIS — E039 Hypothyroidism, unspecified: Secondary | ICD-10-CM | POA: Diagnosis not present

## 2022-08-04 DIAGNOSIS — B9689 Other specified bacterial agents as the cause of diseases classified elsewhere: Secondary | ICD-10-CM | POA: Diagnosis not present

## 2022-08-04 DIAGNOSIS — Z87891 Personal history of nicotine dependence: Secondary | ICD-10-CM | POA: Diagnosis not present

## 2022-08-05 DIAGNOSIS — N390 Urinary tract infection, site not specified: Secondary | ICD-10-CM | POA: Diagnosis not present

## 2022-08-05 DIAGNOSIS — K5900 Constipation, unspecified: Secondary | ICD-10-CM | POA: Diagnosis not present

## 2022-08-05 DIAGNOSIS — B9689 Other specified bacterial agents as the cause of diseases classified elsewhere: Secondary | ICD-10-CM | POA: Diagnosis not present

## 2022-08-05 DIAGNOSIS — R339 Retention of urine, unspecified: Secondary | ICD-10-CM | POA: Diagnosis not present

## 2022-08-05 DIAGNOSIS — N3000 Acute cystitis without hematuria: Secondary | ICD-10-CM | POA: Diagnosis not present

## 2022-08-05 DIAGNOSIS — R338 Other retention of urine: Secondary | ICD-10-CM | POA: Diagnosis not present

## 2022-08-05 DIAGNOSIS — E785 Hyperlipidemia, unspecified: Secondary | ICD-10-CM | POA: Diagnosis not present

## 2022-08-05 DIAGNOSIS — I4891 Unspecified atrial fibrillation: Secondary | ICD-10-CM | POA: Diagnosis not present

## 2022-08-05 DIAGNOSIS — R7989 Other specified abnormal findings of blood chemistry: Secondary | ICD-10-CM | POA: Diagnosis not present

## 2022-08-05 DIAGNOSIS — E039 Hypothyroidism, unspecified: Secondary | ICD-10-CM | POA: Diagnosis not present

## 2022-08-05 DIAGNOSIS — N401 Enlarged prostate with lower urinary tract symptoms: Secondary | ICD-10-CM | POA: Diagnosis not present

## 2022-08-05 DIAGNOSIS — Z87891 Personal history of nicotine dependence: Secondary | ICD-10-CM | POA: Diagnosis not present

## 2022-08-06 DIAGNOSIS — N390 Urinary tract infection, site not specified: Secondary | ICD-10-CM | POA: Diagnosis not present

## 2022-08-06 DIAGNOSIS — N401 Enlarged prostate with lower urinary tract symptoms: Secondary | ICD-10-CM | POA: Diagnosis not present

## 2022-08-06 DIAGNOSIS — R339 Retention of urine, unspecified: Secondary | ICD-10-CM | POA: Diagnosis not present

## 2022-08-06 DIAGNOSIS — E039 Hypothyroidism, unspecified: Secondary | ICD-10-CM | POA: Diagnosis not present

## 2022-08-06 DIAGNOSIS — N3000 Acute cystitis without hematuria: Secondary | ICD-10-CM | POA: Diagnosis not present

## 2022-08-06 DIAGNOSIS — R338 Other retention of urine: Secondary | ICD-10-CM | POA: Diagnosis not present

## 2022-08-06 DIAGNOSIS — E785 Hyperlipidemia, unspecified: Secondary | ICD-10-CM | POA: Diagnosis not present

## 2022-08-06 DIAGNOSIS — Z87891 Personal history of nicotine dependence: Secondary | ICD-10-CM | POA: Diagnosis not present

## 2022-08-06 DIAGNOSIS — R7989 Other specified abnormal findings of blood chemistry: Secondary | ICD-10-CM | POA: Diagnosis not present

## 2022-08-06 DIAGNOSIS — B9689 Other specified bacterial agents as the cause of diseases classified elsewhere: Secondary | ICD-10-CM | POA: Diagnosis not present

## 2022-08-06 DIAGNOSIS — K5900 Constipation, unspecified: Secondary | ICD-10-CM | POA: Diagnosis not present

## 2022-08-06 DIAGNOSIS — I4891 Unspecified atrial fibrillation: Secondary | ICD-10-CM | POA: Diagnosis not present

## 2022-08-07 DIAGNOSIS — N401 Enlarged prostate with lower urinary tract symptoms: Secondary | ICD-10-CM | POA: Diagnosis not present

## 2022-08-07 DIAGNOSIS — B9689 Other specified bacterial agents as the cause of diseases classified elsewhere: Secondary | ICD-10-CM | POA: Diagnosis not present

## 2022-08-07 DIAGNOSIS — I4891 Unspecified atrial fibrillation: Secondary | ICD-10-CM | POA: Diagnosis not present

## 2022-08-07 DIAGNOSIS — E785 Hyperlipidemia, unspecified: Secondary | ICD-10-CM | POA: Diagnosis not present

## 2022-08-07 DIAGNOSIS — R338 Other retention of urine: Secondary | ICD-10-CM | POA: Diagnosis not present

## 2022-08-07 DIAGNOSIS — R339 Retention of urine, unspecified: Secondary | ICD-10-CM | POA: Diagnosis not present

## 2022-08-07 DIAGNOSIS — Z87891 Personal history of nicotine dependence: Secondary | ICD-10-CM | POA: Diagnosis not present

## 2022-08-07 DIAGNOSIS — K5900 Constipation, unspecified: Secondary | ICD-10-CM | POA: Diagnosis not present

## 2022-08-07 DIAGNOSIS — N3000 Acute cystitis without hematuria: Secondary | ICD-10-CM | POA: Diagnosis not present

## 2022-08-07 DIAGNOSIS — R7989 Other specified abnormal findings of blood chemistry: Secondary | ICD-10-CM | POA: Diagnosis not present

## 2022-08-07 DIAGNOSIS — E039 Hypothyroidism, unspecified: Secondary | ICD-10-CM | POA: Diagnosis not present

## 2022-08-07 DIAGNOSIS — N390 Urinary tract infection, site not specified: Secondary | ICD-10-CM | POA: Diagnosis not present

## 2022-08-08 ENCOUNTER — Emergency Department (HOSPITAL_COMMUNITY)
Admission: EM | Admit: 2022-08-08 | Discharge: 2022-08-08 | Disposition: A | Discharge status: Home / Self Care | Payer: Medicare HMO | Attending: Emergency Medicine | Admitting: Emergency Medicine

## 2022-08-08 ENCOUNTER — Other Ambulatory Visit: Payer: Self-pay

## 2022-08-08 ENCOUNTER — Encounter (HOSPITAL_COMMUNITY): Payer: Self-pay | Admitting: *Deleted

## 2022-08-08 DIAGNOSIS — Z7982 Long term (current) use of aspirin: Secondary | ICD-10-CM | POA: Insufficient documentation

## 2022-08-08 DIAGNOSIS — I2510 Atherosclerotic heart disease of native coronary artery without angina pectoris: Secondary | ICD-10-CM | POA: Diagnosis not present

## 2022-08-08 DIAGNOSIS — N3001 Acute cystitis with hematuria: Secondary | ICD-10-CM | POA: Insufficient documentation

## 2022-08-08 DIAGNOSIS — D72829 Elevated white blood cell count, unspecified: Secondary | ICD-10-CM | POA: Insufficient documentation

## 2022-08-08 DIAGNOSIS — R319 Hematuria, unspecified: Secondary | ICD-10-CM | POA: Diagnosis present

## 2022-08-08 LAB — CBC WITH DIFFERENTIAL/PLATELET
Abs Immature Granulocytes: 0.06 10*3/uL (ref 0.00–0.07)
Basophils Absolute: 0.1 10*3/uL (ref 0.0–0.1)
Basophils Relative: 0 %
Eosinophils Absolute: 0.1 10*3/uL (ref 0.0–0.5)
Eosinophils Relative: 1 %
HCT: 41.8 % (ref 39.0–52.0)
Hemoglobin: 13.9 g/dL (ref 13.0–17.0)
Immature Granulocytes: 1 %
Lymphocytes Relative: 25 %
Lymphs Abs: 3.2 10*3/uL (ref 0.7–4.0)
MCH: 31.4 pg (ref 26.0–34.0)
MCHC: 33.3 g/dL (ref 30.0–36.0)
MCV: 94.4 fL (ref 80.0–100.0)
Monocytes Absolute: 1.1 10*3/uL — ABNORMAL HIGH (ref 0.1–1.0)
Monocytes Relative: 9 %
Neutro Abs: 8.2 10*3/uL — ABNORMAL HIGH (ref 1.7–7.7)
Neutrophils Relative %: 64 %
Platelets: 239 10*3/uL (ref 150–400)
RBC: 4.43 MIL/uL (ref 4.22–5.81)
RDW: 12.4 % (ref 11.5–15.5)
WBC: 12.6 10*3/uL — ABNORMAL HIGH (ref 4.0–10.5)
nRBC: 0 % (ref 0.0–0.2)

## 2022-08-08 LAB — URINALYSIS, ROUTINE W REFLEX MICROSCOPIC
Bilirubin Urine: NEGATIVE
Glucose, UA: NEGATIVE mg/dL
Ketones, ur: NEGATIVE mg/dL
Nitrite: NEGATIVE
Protein, ur: 100 mg/dL — AB
RBC / HPF: >50 RBC/hpf (ref 0–5)
Specific Gravity, Urine: 1.006 (ref 1.005–1.030)
pH: 6 (ref 5.0–8.0)

## 2022-08-08 LAB — BASIC METABOLIC PANEL
Anion gap: 10 (ref 5–15)
BUN: 25 mg/dL — ABNORMAL HIGH (ref 8–23)
CO2: 27 mmol/L (ref 22–32)
Calcium: 9.4 mg/dL (ref 8.9–10.3)
Chloride: 99 mmol/L (ref 98–111)
Creatinine, Ser: 1.48 mg/dL — ABNORMAL HIGH (ref 0.61–1.24)
GFR, Estimated: 47 mL/min — ABNORMAL LOW (ref >60)
Glucose, Bld: 102 mg/dL — ABNORMAL HIGH (ref 70–99)
Potassium: 3.8 mmol/L (ref 3.5–5.1)
Sodium: 136 mmol/L (ref 135–145)

## 2022-08-08 MED ORDER — CEFDINIR 300 MG PO CAPS: 300.0000 mg | ORAL_CAPSULE | Freq: Two times a day (BID) | ORAL | Status: DC

## 2022-08-08 MED ORDER — CEFDINIR 300 MG PO CAPS
300.0000 mg | ORAL_CAPSULE | Freq: Once | ORAL | Status: AC
  Administered 2022-08-08: 300 mg via ORAL
  Filled 2022-08-08: qty 1

## 2022-08-08 MED ORDER — CEFDINIR 300 MG PO CAPS: 300.0000 mg | ORAL_CAPSULE | Freq: Two times a day (BID) | ORAL | 0 refills | Status: AC

## 2022-08-08 NOTE — ED Triage Notes (Signed)
Pt's urine taken to lab but scant amount in cup; pt's spouse informed if he is able to obtain another specimen to please let us know. She verbalized understanding  Specimen taken to lab and personnel informed if there is more urine needed to call staff. Verbalized understanding

## 2022-08-08 NOTE — ED Triage Notes (Signed)
Pt urinating blood per pt's wife that started last night;   Pt was discharged from [facility] health yesterday for urinary retention and constipation  Pt states when he was discharged he could urinate without difficulty

## 2022-08-08 NOTE — ED Provider Notes (Signed)
EMERGENCY DEPARTMENT AT [facility] Provider Note   CSN: [id number] Arrival date & time: 08/08/22  1236     History  Chief Complaint  Patient presents with   Hematuria    Clayton Austin is a 82 y.o. male.  He has PMH of BPH, CAD, A-fib.  Presents the ER complaining of hematuria this morning.  He was hospitalized on 4/13 and discharged on 4/18.  He had presented to the ED at [facility], had elevated troponins also having urinary tension and UTI.  Was ultimately transferred due to the elevated troponin and found on admission   Hematuria       Home Medications Prior to Admission medications   Medication Sig Start Date End Date Taking? Authorizing Provider  cefdinir (OMNICEF) 300 MG capsule Take 1 capsule (300 mg total) by mouth 2 (two) times daily for 7 days. 08/08/22 08/15/22 Yes Jesica Goheen A, PA-C  amLODipine (NORVASC) 10 MG tablet Take 10 mg by mouth every morning.     [provider]  aspirin EC 81 MG tablet Take 1 tablet (81 mg total) by mouth daily. 06/06/14   Laqueta Linden, MD  atorvastatin (LIPITOR) 20 MG tablet Take 20 mg by mouth every morning.     [provider]  finasteride (PROSCAR) 5 MG tablet Take 1 tablet (5 mg total) by mouth daily. 09/06/21   McKenzie, Mardene Chantille Navarrete, MD  HYDROcodone-acetaminophen (NORCO) 5-325 MG tablet Take 1 tablet by mouth every 6 (six) hours as needed for moderate pain. 10/31/21   McKenzie, Mardene Liban Guedes, MD  levothyroxine (SYNTHROID) 75 MCG tablet Take 75 mcg by mouth daily. 07/20/21   [provider]  losartan (COZAAR) 25 MG tablet Take 1 tablet by mouth daily. 04/08/17   [provider]  metoprolol tartrate (LOPRESSOR) 25 MG tablet Take 1 tablet (25 mg total) by mouth 2 (two) times daily. 04/08/17   Laqueta Linden, MD  omeprazole (PRILOSEC) 20 MG capsule Take 20 mg by mouth every morning.     [provider]  tamsulosin (FLOMAX) 0.4 MG CAPS capsule Take 1 capsule  (0.4 mg total) by mouth 2 (two) times daily. 09/06/21   McKenzie, Mardene Lakeyn Dokken, MD      Allergies    Patient has no known allergies.    Review of Systems   Review of Systems  Genitourinary:  Positive for hematuria.    Physical Exam Updated Vital Signs BP (!) 153/73 (BP Location: Right Arm)   Pulse 60   Temp 98.4 F (36.9 C) (Oral)   Resp 16   Ht  (1.803 m)   Wt 68.5 kg   SpO2 98%   BMI 21.06 kg/m  Physical Exam  ED Results / Procedures / Treatments   Labs (all labs ordered are listed, but only abnormal results are displayed) Labs Reviewed  URINALYSIS, ROUTINE W REFLEX MICROSCOPIC - Abnormal; Notable for the following components:      Result Value   Color, Urine RED (*)    APPearance CLOUDY (*)    Hgb urine dipstick MODERATE (*)    Protein, ur 100 (*)    Leukocytes,Ua MODERATE (*)    Bacteria, UA RARE (*)    All other components within normal limits  BASIC METABOLIC PANEL - Abnormal; Notable for the following components:   Glucose, Bld 102 (*)    BUN 25 (*)    Creatinine, Ser 1.48 (*)    GFR, Estimated 47 (*)    All other  components within normal limits  CBC WITH DIFFERENTIAL/PLATELET - Abnormal; Notable for the following components:   WBC 12.6 (*)    Neutro Abs 8.2 (*)    Monocytes Absolute 1.1 (*)    All other components within normal limits  URINE CULTURE    EKG None  Radiology No results found.  Procedures Procedures    Medications Ordered in ED Medications  cefdinir (OMNICEF) capsule 300 mg (has no administration in time range)    ED Course/ Medical Decision Making/ A&P                             Medical Decision Making This patient presents to the ED for concern of dysuria after having catheter removed yesterday, this involves an extensive number of treatment options, and is a complaint that carries with it a high risk of complications and morbidity.  The differential diagnosis includes UTI, kidney stone, bladder carcinoma, renal  carcinoma, other   Co morbidities that complicate the patient evaluation  A-fib, BPH   Additional history obtained:  Additional history obtained from EMR External records from outside source obtained and reviewed including the note from [facility] recently, discharge information from yesterday from [facility]   Lab Tests:  I Ordered, and personally interpreted labs.  The pertinent results include: BMP shows slightly increased BUN/creatinine around patient's baseline, normal electrolytes,  CBC shows slightly increased white blood cell count at 12.6,  urine shows leukocyte moderate, 3-50 red blood cells per high-power field and 12-50 white blood cells per high-power field and rare bacteria consistent with UTI     Problem List / ED Course / Critical interventions / Medication management  Hematuria with urinary frequency-patient just had a catheter in DC'd yesterday, he is voiding, PVR was only 106 mL on bladder scan and this was not done immediately after voiding.  Do not feel patient needs reinsertion of the catheter but will treat the UTI.  He was sent home on amoxicillin for his UTI will change him to cefdinir, vies on close urology and PCP follow-up, urine was sent for culture.  Patient is afebrile, well-appearing, no CVA tenderness on exam.  His only concern today was that hematuria  I have reviewed the patients home medicines and have made adjustments as needed       Amount and/or Complexity of Data Reviewed Labs: ordered.           Final Clinical Impression(s) / ED Diagnoses Final diagnoses:  Acute cystitis with hematuria    Rx / DC Orders ED Discharge Orders          Ordered    cefdinir (OMNICEF) 300 MG capsule  2 times daily        08/08/22 [address] 08/08/22 1825    Eber Hong, MD 08/10/22 [phone]

## 2022-08-08 NOTE — ED Provider Triage Note (Signed)
Emergency Medicine Provider Triage Evaluation Note  Clayton Austin , a 82 y.o. male  was evaluated in triage.  Pt complains of hematuria being possibly urinary retention.  Patient was recently admitted to the hospital earlier this month for A-fib with RVR and urinary retention.  Patient states that upon discharge, catheter was removed and patient was given Flomax.  States that yesterday he noted blood in his urine and feelings as if he may be retaining.  Denies any abdominal pain, fever, nausea vomiting, dysuria.  Patient on Eliquis for A-fib.  Review of Systems  Positive: See above Negative:   Physical Exam  BP 121/61   Pulse 70   Temp 98.4 F (36.9 C) (Oral)   Resp 16   Ht  (1.803 m)   Wt 68.5 kg   SpO2 97%   BMI 21.06 kg/m  Gen:   Awake, no distress   Resp:  Normal effort  MSK:   Moves extremities without difficulty  Other:    Medical Decision Making  Medically screening exam initiated at 3:55 PM.  Appropriate orders placed.  Clayton Austin was informed that the remainder of the evaluation will be completed by another provider, this initial triage assessment does not replace that evaluation, and the importance of remaining in the ED until their evaluation is complete.     Peter Garter, [place] 08/08/22 1558

## 2022-08-08 NOTE — Discharge Instructions (Addendum)
You have an infection In your urine.  Although you have been on antibiotics you still have a UTI.  We are going to change her antibiotics.  It is very important to follow-up with primary care and urology.  Come back if you have difficulty urinating, fevers, stomach or back pain or any other worrisome changes.

## 2022-08-10 LAB — URINE CULTURE: Culture: NO GROWTH

## 2022-08-12 ENCOUNTER — Telehealth: Payer: Self-pay

## 2022-08-12 NOTE — Telephone Encounter (Signed)
        Patient  visited [facility] on 08/08/2022  for Hematuria.   Telephone encounter attempt : 1st   A HIPAA compliant voice message was left requesting a return call.  Instructed patient to call back at [phone].   Shailene Demonbreun [facility]  [facility] Population Health Community Resource Care Guide   [email or web address]  ?? [phone]   Website: [email or web address]  [email or web address]

## 2022-08-13 DIAGNOSIS — E039 Hypothyroidism, unspecified: Secondary | ICD-10-CM | POA: Diagnosis not present

## 2022-08-13 DIAGNOSIS — I480 Paroxysmal atrial fibrillation: Secondary | ICD-10-CM | POA: Diagnosis not present

## 2022-08-13 DIAGNOSIS — I5032 Chronic diastolic (congestive) heart failure: Secondary | ICD-10-CM | POA: Diagnosis not present

## 2022-08-13 DIAGNOSIS — N390 Urinary tract infection, site not specified: Secondary | ICD-10-CM | POA: Diagnosis not present

## 2022-08-13 DIAGNOSIS — I2510 Atherosclerotic heart disease of native coronary artery without angina pectoris: Secondary | ICD-10-CM | POA: Diagnosis not present

## 2022-08-13 DIAGNOSIS — N401 Enlarged prostate with lower urinary tract symptoms: Secondary | ICD-10-CM | POA: Diagnosis not present

## 2022-08-14 ENCOUNTER — Telehealth: Payer: Self-pay

## 2022-08-14 NOTE — Telephone Encounter (Signed)
     Patient  visit on 08/08/2022  at [facility] was for hematuria, acute cystitis.  Have you been able to follow up with your primary care physician? Yes  The patient was or was not able to obtain any needed medicine or equipment. Patient was able to obtain medication.  Are there diet recommendations that you are having difficulty following? No  Patient expresses understanding of discharge instructions and education provided has no other needs at this time. Yes  Consent was given to send [id number] referral to [facility] and food pantries. Also mailing food pantry list.   Early Ord [facility]  [facility] Population Health Community Resource Care Guide   [email or web address]  ?? [phone]   Website: [email or web address]  [email or web address]

## 2022-08-28 DIAGNOSIS — I10 Essential (primary) hypertension: Secondary | ICD-10-CM | POA: Diagnosis not present

## 2022-08-28 DIAGNOSIS — I2510 Atherosclerotic heart disease of native coronary artery without angina pectoris: Secondary | ICD-10-CM | POA: Diagnosis not present

## 2022-08-28 DIAGNOSIS — Z1330 Encounter for screening examination for mental health and behavioral disorders, unspecified: Secondary | ICD-10-CM | POA: Diagnosis not present

## 2022-08-28 DIAGNOSIS — R7989 Other specified abnormal findings of blood chemistry: Secondary | ICD-10-CM | POA: Diagnosis not present

## 2022-08-28 DIAGNOSIS — I480 Paroxysmal atrial fibrillation: Secondary | ICD-10-CM | POA: Diagnosis not present

## 2022-09-12 DIAGNOSIS — I10 Essential (primary) hypertension: Secondary | ICD-10-CM | POA: Diagnosis not present

## 2022-09-12 DIAGNOSIS — I2510 Atherosclerotic heart disease of native coronary artery without angina pectoris: Secondary | ICD-10-CM | POA: Diagnosis not present

## 2022-09-22 NOTE — Progress Notes (Deleted)
History of Present Illness: [name] is a 82 y.o. male who presents today for follow up visit at [facility] [place]. - Relevant past medical history includes *** - GU History: 1. BPH. Underwent TURP in March 2014. 2. Urethral stricture (recurrent). 3. Bladder stones.  4. 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 5. Erectile dysfunction.  At last visit with Dr. Ronne Binning on 11/06/2021: - Doing well following cystolithalopaxy and urethral dilation on 10/31/2021.   Since last visit: - 08/08/2022: Seen in ER for hematuria following catheter removal 1 day prior. PVR = 106 ml "and this was not done immediately after voiding." Treated for UTI based on UA; urine culture came back negative.  Today: RUS today: Awaiting radiology read; ***  KUB today: Awaiting radiology read; ***  He {Actions; denies-reports:120008} recent stone episode(s). He {Actions; denies-reports:120008} flank pain/ abdominal pain. He {Actions; denies-reports:120008} fevers. He {Actions; denies-reports:120008} nausea/ vomiting.  He urinates *** times per day. He {Actions; denies-reports:120008} urgency. He {Actions; denies-reports:120008} dysuria. He {Actions; denies-reports:120008} gross hematuria. He {Actions; denies-reports:120008} the need to strain to void. He {Actions; denies-reports:120008} sensations of incomplete emptying.  ***RUS & KUB ***BMP & CBC (none recent)   Fall Screening: Do you usually have a device to assist in your mobility? {yes/no:20286} (***cane / ***walker / ***wheelchair)  Medications: Current Outpatient Medications  Medication Sig Dispense Refill   amLODipine (NORVASC) 10 MG tablet Take 10 mg by mouth every morning.      aspirin EC 81 MG tablet Take 1 tablet (81 mg total) by mouth daily.     atorvastatin (LIPITOR) 20 MG tablet Take 20 mg by mouth every morning.      finasteride (PROSCAR) 5 MG tablet Take 1 tablet (5 mg total) by mouth daily. 90 tablet 3    HYDROcodone-acetaminophen (NORCO) 5-325 MG tablet Take 1 tablet by mouth every 6 (six) hours as needed for moderate pain. 30 tablet 0   levothyroxine (SYNTHROID) 75 MCG tablet Take 75 mcg by mouth daily.     losartan (COZAAR) 25 MG tablet Take 1 tablet by mouth daily.     metoprolol tartrate (LOPRESSOR) 25 MG tablet Take 1 tablet (25 mg total) by mouth 2 (two) times daily. 60 tablet 11   omeprazole (PRILOSEC) 20 MG capsule Take 20 mg by mouth every morning.      tamsulosin (FLOMAX) 0.4 MG CAPS capsule Take 1 capsule (0.4 mg total) by mouth 2 (two) times daily. 60 capsule 11   No current facility-administered medications for this visit.    Allergies: No Known Allergies  Past Medical History:  Diagnosis Date   Benign prostatic hypertrophy    BPH (benign prostatic hypertrophy)    Coronary artery disease cardiologist-  dr Purvis Sheffield   a. NSTEMI 07/2012 => critical LM disease on LHC => s/p CABG (L-LAD/D1, S-OM2, S-PDA/PL);  EF 55-60% at [facility] 07/2012   Exercise tolerance test normal    Dec 2014  per dr Purvis Sheffield (cardiologist) note [facility] treadmilll score 5.5 (low risk)   GERD (gastroesophageal reflux disease)    History of atrial fibrillation    post-op  CABG 04/ 2014   History of non-ST elevation myocardial infarction (NSTEMI)    04/ 2014   Hyperlipidemia    Hypertension    Hypothyroidism    Ischemic heart disease    STABLE PER DR Purvis Sheffield (CARDIOLOGIST) NOTE 06-18-2015   Lower urinary tract symptoms (LUTS)    RBBB (right bundle branch block)    S/P CABG x 5  08-17-2012   LIMA sequentilly to LAD and Diagonal,  SVG to OM,  SVG seq to PDA and PLB with EVH from right thigh   Urethral stricture    recurrent   Wears dentures    UPPER   Wears glasses    Past Surgical History:  Procedure Laterality Date   CORONARY ARTERY BYPASS GRAFT N/A 08/17/2012   Procedure: CORONARY ARTERY BYPASS GRAFTING (CABG);  Surgeon: Loreli Slot, MD;  Location: [facility] OR;  Service: Open Heart Surgery;   Laterality: N/A;  L-LAD/D1, S-OM2,  S-PDA/PL   CYSTOSCOPY N/A 08/17/2012   Procedure: CYSTOSCOPY FLEXIBLE for difficult  foley catheter insertion;  Surgeon: Sebastian Ache, MD;  Location: [facility] OR;  Service: Urology;  Laterality: N/A;   CYSTOSCOPY N/A 01/16/2016   Procedure: CYSTOSCOPY FLEXIBLE;  Surgeon: Sebastian Ache, MD;  Location: [facility];  Service: Urology;  Laterality: N/A;   CYSTOSCOPY WITH LITHOLAPAXY N/A 10/31/2021   Procedure: CYSTOSCOPY WITH LITHOLAPAXY;  Surgeon: Malen Gauze, MD;  Location: [facility];  Service: Urology;  Laterality: N/A;   CYSTOSCOPY WITH URETHRAL DILATATION N/A 01/16/2016   Procedure: CYSTOSCOPY WITH URETHRAL BALLOON DILATATION;  Surgeon: Sebastian Ache, MD;  Location: [facility];  Service: Urology;  Laterality: N/A;   LEFT HEART CATHETERIZATION WITH CORONARY ANGIOGRAM N/A 08/16/2012   Procedure: LEFT HEART CATHETERIZATION WITH CORONARY ANGIOGRAM;  Surgeon: Tonny Bollman, MD;  Location: [facility] CATH LAB;  Service: Cardiovascular;  Laterality: N/A;  NSTEMI;  critical LM stenosis extending into the ostial LAD & LCFX,  moderately tight RCA stenosis,  Normal LVF, ef 55-60%   TRANSURETHRAL RESECTION OF PROSTATE  06/2012    Family History  Problem Relation Age of Onset   Coronary artery disease Father 56   Social History   Socioeconomic History   Marital status: Married    Spouse name: Not on file   Number of children: Not on file   Years of education: Not on file   Highest education level: Not on file  Occupational History   Not on file  Tobacco Use   Smoking status: Former    Packs/day: 0.50    Years: 10.00    Additional pack years: 0.00    Total pack years: 5.00    Types: Cigarettes    Start date: 08/05/1960    Quit date: 08/06/1970    Years since quitting: 52.1   Smokeless tobacco: Never  Vaping Use   Vaping Use: Never used  Substance and Sexual Activity   Alcohol use: No    Alcohol/week: 0.0 standard drinks of alcohol    Drug use: No   Sexual activity: Not on file  Other Topics Concern   Not on file  Social History Narrative   Not on file   Social Determinants of Health   Financial Resource Strain: Not on file  Food Insecurity: Not on file  Transportation Needs: No Transportation Needs (08/14/2022)   PRAPARE - Administrator, Civil Service (Medical): No    Lack of Transportation (Non-Medical): No  Physical Activity: Not on file  Stress: Not on file  Social Connections: Not on file  Intimate Partner Violence: Not on file    SUBJECTIVE  Review of Systems Constitutional: Patient ***denies any unintentional weight loss or change in strength lntegumentary: Patient ***denies any rashes or pruritus Eyes: Patient denies ***dry eyes ENT: Patient ***denies dry mouth Cardiovascular: Patient ***denies chest pain or syncope Respiratory: Patient ***denies shortness of breath Gastrointestinal: Patient ***denies nausea, vomiting, constipation, or  diarrhea Musculoskeletal: Patient ***denies muscle cramps or weakness Neurologic: Patient ***denies convulsions or seizures Psychiatric: Patient ***denies memory problems Allergic/Immunologic: Patient ***denies recent allergic reaction(s) Hematologic/Lymphatic: Patient denies bleeding tendencies Endocrine: Patient ***denies heat/cold intolerance  GU: As per HPI.  OBJECTIVE There were no vitals filed for this visit. There is no height or weight on file to calculate BMI.  Physical Examination  Constitutional: ***No obvious distress; patient is ***non-toxic appearing  Cardiovascular: ***No visible lower extremity edema.  Respiratory: The patient does ***not have audible wheezing/stridor; respirations do ***not appear labored  Gastrointestinal: Abdomen ***non-distended Musculoskeletal: ***Normal ROM of UEs  Skin: ***No obvious rashes/open sores  Neurologic: CN 2-12 grossly ***intact Psychiatric: Answered questions ***appropriately with ***normal  affect  Hematologic/Lymphatic/Immunologic: ***No obvious bruises or sites of spontaneous bleeding  UA: {Desc; negative/positive:13464} *** WBC/hpf, *** RBC/hpf, bacteria (***) *** nitrites, *** leukocytes, *** blood PVR: *** ml  ASSESSMENT No diagnosis found. ***We reviewed recent imaging results; ***awaiting radiology results, appears to have ***no acute findings.  ***For stone prevention: Advised adequate hydration and we discussed option to consider low oxalate diet given that calcium oxalate is the most common type of stone. Handout provided about stone prevention diet.  ***For recurrent stone formers: We discussed option to proceed with 24 hour urinalysis ([facility]) for metabolic evaluation, which may help with targeted recommendations for dietary I medication therapies for stone prevention. Patient elected to ***proceed/ ***hold off.  Will plan to follow up in ***6 months/ ***1 year with RUS ***KUB for stone surveillance or sooner if needed. Pt verbalized understanding and agreement. All questions were answered.  PLAN Advised the following: ***Maintain adequate fluid intake. ***Low oxalate diet. No follow-ups on file.  No orders of the defined types were placed in this encounter.   It has been explained that the patient is to follow regularly with their PCP in addition to all other providers involved in their care and to follow instructions provided by these respective offices. Patient advised to contact urology clinic if any urologic-pertaining questions, concerns, new symptoms or problems arise in the interim period.  There are no Patient Instructions on file for this visit.  Electronically signed by: @MEMO @ 09/22/2022 10:28 PM

## 2022-09-23 ENCOUNTER — Ambulatory Visit: Payer: Medicare HMO | Admitting: Urology

## 2022-09-23 DIAGNOSIS — N99114 Postprocedural urethral stricture, male, unspecified: Secondary | ICD-10-CM

## 2022-09-23 DIAGNOSIS — N3289 Other specified disorders of bladder: Secondary | ICD-10-CM

## 2022-09-23 DIAGNOSIS — N401 Enlarged prostate with lower urinary tract symptoms: Secondary | ICD-10-CM

## 2022-10-09 ENCOUNTER — Telehealth: Payer: Self-pay

## 2022-10-09 ENCOUNTER — Other Ambulatory Visit: Payer: Self-pay | Admitting: Urology

## 2022-10-09 DIAGNOSIS — N210 Calculus in bladder: Secondary | ICD-10-CM | POA: Insufficient documentation

## 2022-10-09 DIAGNOSIS — N361 Urethral diverticulum: Secondary | ICD-10-CM | POA: Insufficient documentation

## 2022-10-09 DIAGNOSIS — N200 Calculus of kidney: Secondary | ICD-10-CM | POA: Insufficient documentation

## 2022-10-09 NOTE — Telephone Encounter (Signed)
-----   Message from Donnita Falls, FNP sent at 10/09/2022 10:23 AM EDT ----- RUS & KUB ordered. Please ask patient to get those done prior to appointment on Tuesday 10/14/22 to recheck stone burden. Thank you!

## 2022-10-09 NOTE — Progress Notes (Deleted)
History of Present Illness: Clayton Austin is a 82 y.o. male who presents today for follow up visit at [facility] [place]. - GU history: 1. BPH. - March 2014: TURP procedure. - ***Taking Flomax and Proscar. 2. Urethral stricture (recurrent).  3. Kidney / bladder stones. - 10/31/2021: Underwent cystolithalopaxy and urethral dilation by Dr. Ronne Binning.  4. 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 5. Erectile dysfunction.  At last visit with Dr. Ronne Binning on 11/06/2021: Postop check s/p cystolithalopaxy and urethral dilation on 10/31/2021. Passed voiding trial; PVR = 5 ml.   Since last visit: - 08/02/2022: Seen in ER for A-fib, urinary retention secondary to constipation. Treated with enemas and Foley catheter.  - 08/08/2022: Seen in ER for gross hematuria and urinary frequency after Foley catheter removal 1 day prior. PVR = 106 ml per bladder scan and this was not done immediately after voiding. Creatinine 1.18; GFR 62. No leukocytosis (WBC 9.9). Antibiotic changed from Amoxicillin to Cefdinir to treat for suspected UTI. Urine culture came back negative.   ***RUS & KUB to recheck stone burden  Today: He reports ***  He {Actions; denies-reports:120008} recent episode of stone pain/ passage. He {Actions; denies-reports:120008} acute flank pain / abdominal pain.  He reports *** urinary stream. He {Actions; denies-reports:120008} urinary hesitancy, urgency, frequency, dysuria, gross hematuria, straining to void, or sensations of incomplete emptying.   Fall Screening: Do you usually have a device to assist in your mobility? {yes/no:20286} ***cane / ***walker / ***wheelchair   Medications: Current Outpatient Medications  Medication Sig Dispense Refill   amLODipine (NORVASC) 10 MG tablet Take 10 mg by mouth every morning.      aspirin EC 81 MG tablet Take 1 tablet (81 mg total) by mouth daily.     atorvastatin (LIPITOR) 20 MG tablet Take 20 mg by mouth every morning.       finasteride (PROSCAR) 5 MG tablet Take 1 tablet (5 mg total) by mouth daily. 90 tablet 3   HYDROcodone-acetaminophen (NORCO) 5-325 MG tablet Take 1 tablet by mouth every 6 (six) hours as needed for moderate pain. 30 tablet 0   levothyroxine (SYNTHROID) 75 MCG tablet Take 75 mcg by mouth daily.     losartan (COZAAR) 25 MG tablet Take 1 tablet by mouth daily.     metoprolol tartrate (LOPRESSOR) 25 MG tablet Take 1 tablet (25 mg total) by mouth 2 (two) times daily. 60 tablet 11   omeprazole (PRILOSEC) 20 MG capsule Take 20 mg by mouth every morning.      tamsulosin (FLOMAX) 0.4 MG CAPS capsule Take 1 capsule (0.4 mg total) by mouth 2 (two) times daily. 60 capsule 11   No current facility-administered medications for this visit.    Allergies: No Known Allergies  Past Medical History:  Diagnosis Date   Benign prostatic hypertrophy    BPH (benign prostatic hypertrophy)    Coronary artery disease cardiologist-  dr Purvis Sheffield   a. NSTEMI 07/2012 => critical LM disease on LHC => s/p CABG (L-LAD/D1, S-OM2, S-PDA/PL);  EF 55-60% at [facility] 07/2012   Exercise tolerance test normal    Dec 2014  per dr Purvis Sheffield (cardiologist) note [facility] treadmilll score 5.5 (low risk)   GERD (gastroesophageal reflux disease)    History of atrial fibrillation    post-op  CABG 04/ 2014   History of non-ST elevation myocardial infarction (NSTEMI)    04/ 2014   Hyperlipidemia    Hypertension    Hypothyroidism    Ischemic heart disease  STABLE PER DR Purvis Sheffield (CARDIOLOGIST) NOTE 06-18-2015   Lower urinary tract symptoms (LUTS)    RBBB (right bundle branch block)    S/P CABG x 5    08-17-2012   LIMA sequentilly to LAD and Diagonal,  SVG to OM,  SVG seq to PDA and PLB with EVH from right thigh   Urethral stricture    recurrent   Wears dentures    UPPER   Wears glasses    Past Surgical History:  Procedure Laterality Date   CORONARY ARTERY BYPASS GRAFT N/A 08/17/2012   Procedure: CORONARY ARTERY BYPASS  GRAFTING (CABG);  Surgeon: Loreli Slot, MD;  Location: [facility] OR;  Service: Open Heart Surgery;  Laterality: N/A;  L-LAD/D1, S-OM2,  S-PDA/PL   CYSTOSCOPY N/A 08/17/2012   Procedure: CYSTOSCOPY FLEXIBLE for difficult  foley catheter insertion;  Surgeon: Sebastian Ache, MD;  Location: [facility] OR;  Service: Urology;  Laterality: N/A;   CYSTOSCOPY N/A 01/16/2016   Procedure: CYSTOSCOPY FLEXIBLE;  Surgeon: Sebastian Ache, MD;  Location: [facility];  Service: Urology;  Laterality: N/A;   CYSTOSCOPY WITH LITHOLAPAXY N/A 10/31/2021   Procedure: CYSTOSCOPY WITH LITHOLAPAXY;  Surgeon: Malen Gauze, MD;  Location: [facility];  Service: Urology;  Laterality: N/A;   CYSTOSCOPY WITH URETHRAL DILATATION N/A 01/16/2016   Procedure: CYSTOSCOPY WITH URETHRAL BALLOON DILATATION;  Surgeon: Sebastian Ache, MD;  Location: [facility];  Service: Urology;  Laterality: N/A;   LEFT HEART CATHETERIZATION WITH CORONARY ANGIOGRAM N/A 08/16/2012   Procedure: LEFT HEART CATHETERIZATION WITH CORONARY ANGIOGRAM;  Surgeon: Tonny Bollman, MD;  Location: [facility] CATH LAB;  Service: Cardiovascular;  Laterality: N/A;  NSTEMI;  critical LM stenosis extending into the ostial LAD & LCFX,  moderately tight RCA stenosis,  Normal LVF, ef 55-60%   TRANSURETHRAL RESECTION OF PROSTATE  06/2012   Family History  Problem Relation Age of Onset   Coronary artery disease Father 86   Social History   Socioeconomic History   Marital status: Married    Spouse name: Not on file   Number of children: Not on file   Years of education: Not on file   Highest education level: Not on file  Occupational History   Not on file  Tobacco Use   Smoking status: Former    Packs/day: 0.50    Years: 10.00    Additional pack years: 0.00    Total pack years: 5.00    Types: Cigarettes    Start date: 08/05/1960    Quit date: 08/06/1970    Years since quitting: 52.2   Smokeless tobacco: Never  Vaping Use   Vaping Use: Never used   Substance and Sexual Activity   Alcohol use: No    Alcohol/week: 0.0 standard drinks of alcohol   Drug use: No   Sexual activity: Not on file  Other Topics Concern   Not on file  Social History Narrative   Not on file   Social Determinants of Health   Financial Resource Strain: Not on file  Food Insecurity: Not on file  Transportation Needs: No Transportation Needs (08/14/2022)   PRAPARE - Administrator, Civil Service (Medical): No    Lack of Transportation (Non-Medical): No  Physical Activity: Not on file  Stress: Not on file  Social Connections: Not on file  Intimate Partner Violence: Not on file    Review of Systems Constitutional: Patient ***denies any unintentional weight loss or change in strength lntegumentary: Patient ***denies any rashes or pruritus Eyes:  Patient denies ***dry eyes ENT: Patient ***denies dry mouth Cardiovascular: Patient ***denies chest pain or syncope Respiratory: Patient ***denies shortness of breath Gastrointestinal: Patient ***denies nausea, vomiting, constipation, or diarrhea Musculoskeletal: Patient ***denies muscle cramps or weakness Neurologic: Patient ***denies convulsions or seizures Psychiatric: Patient ***denies memory problems Allergic/Immunologic: Patient ***denies recent allergic reaction(s) Hematologic/Lymphatic: Patient denies bleeding tendencies Endocrine: Patient ***denies heat/cold intolerance  GU: As per HPI.  OBJECTIVE There were no vitals filed for this visit. There is no height or weight on file to calculate BMI.  Physical Examination  Constitutional: ***No obvious distress; patient is ***non-toxic appearing  Cardiovascular: ***No visible lower extremity edema.  Respiratory: The patient does ***not have audible wheezing/stridor; respirations do ***not appear labored  Gastrointestinal: Abdomen ***non-distended Musculoskeletal: ***Normal ROM of UEs  Skin: ***No obvious rashes/open sores  Neurologic: CN  2-12 grossly ***intact Psychiatric: Answered questions ***appropriately with ***normal affect  Hematologic/Lymphatic/Immunologic: ***No obvious bruises or sites of spontaneous bleeding  UA: {Desc; negative/positive:13464} *** WBC/hpf, *** RBC/hpf, bacteria (***) *** nitrites, *** leukocytes, *** blood PVR: *** ml  ASSESSMENT No diagnosis found. ***  Will plan for follow up in ***months / ***1 year or sooner if needed. Pt verbalized understanding and agreement. All questions were answered.  PLAN Advised the following: 1. *** 2. ***No follow-ups on file.  No orders of the defined types were placed in this encounter.   It has been explained that the patient is to follow regularly with their PCP in addition to all other providers involved in their care and to follow instructions provided by these respective offices. Patient advised to contact urology clinic if any urologic-pertaining questions, concerns, new symptoms or problems arise in the interim period.  There are no Patient Instructions on file for this visit.  Electronically signed by:  Donnita Falls, FNP   10/09/22    10:10 AM

## 2022-10-09 NOTE — Telephone Encounter (Signed)
Patient made aware and voiced understanding.

## 2022-10-13 DIAGNOSIS — I2510 Atherosclerotic heart disease of native coronary artery without angina pectoris: Secondary | ICD-10-CM | POA: Diagnosis not present

## 2022-10-13 DIAGNOSIS — I10 Essential (primary) hypertension: Secondary | ICD-10-CM | POA: Diagnosis not present

## 2022-10-14 ENCOUNTER — Ambulatory Visit: Payer: Medicare HMO | Admitting: Urology

## 2022-10-27 ENCOUNTER — Ambulatory Visit (HOSPITAL_COMMUNITY)
Admission: RE | Admit: 2022-10-27 | Discharge: 2022-10-27 | Disposition: A | Discharge status: Home / Self Care | Payer: Medicare HMO | Source: Ambulatory Visit | Attending: Urology | Admitting: Urology

## 2022-10-27 DIAGNOSIS — N210 Calculus in bladder: Secondary | ICD-10-CM | POA: Diagnosis not present

## 2022-10-27 DIAGNOSIS — N200 Calculus of kidney: Secondary | ICD-10-CM | POA: Diagnosis not present

## 2022-10-28 NOTE — Progress Notes (Deleted)
Name: Clayton Austin DOB: 02-26-1941 MRN: [id number]  History of Present Illness: Clayton Austin is a 82 y.o. male who presents today for follow up visit at [facility] [place]. - GU history: 1. BPH. - March 2014: TURP procedure. - ***Taking Flomax and Proscar. 2. Urethral stricture (recurrent).  3. Kidney / bladder stones. - 10/31/2021: Underwent cystolithalopaxy and urethral dilation by Dr. Ronne Binning.  4. 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 5. Erectile dysfunction.  At last visit with Dr. Ronne Binning on 11/06/2021: Postop check s/p cystolithalopaxy and urethral dilation on 10/31/2021. Passed voiding trial; PVR = 5 ml.   Since last visit: - 08/02/2022: Seen in ER for A-fib, urinary retention secondary to constipation. Treated with enemas and Foley catheter.  - 08/08/2022: Seen in ER for gross hematuria and urinary frequency after Foley catheter removal 1 day prior. PVR = 106 ml per bladder scan and this was not done immediately after voiding. Creatinine 1.18; GFR 62. No leukocytosis (WBC 9.9). Antibiotic changed from Amoxicillin to Cefdinir to treat for suspected UTI. Urine culture came back negative.   ***RUS & KUB to recheck stone burden  Today: He reports ***  He {Actions; denies-reports:120008} recent episode of stone pain/ passage. He {Actions; denies-reports:120008} acute flank pain / abdominal pain.  He reports *** urinary stream. He {Actions; denies-reports:120008} urinary hesitancy, urgency, frequency, dysuria, gross hematuria, straining to void, or sensations of incomplete emptying.   Fall Screening: Do you usually have a device to assist in your mobility? {yes/no:20286} ***cane / ***walker / ***wheelchair   Medications: Current Outpatient Medications  Medication Sig Dispense Refill   amLODipine (NORVASC) 10 MG tablet Take 10 mg by mouth every morning.      aspirin EC 81 MG tablet Take 1 tablet (81 mg total) by mouth daily.     atorvastatin  (LIPITOR) 20 MG tablet Take 20 mg by mouth every morning.      finasteride (PROSCAR) 5 MG tablet Take 1 tablet (5 mg total) by mouth daily. 90 tablet 3   HYDROcodone-acetaminophen (NORCO) 5-325 MG tablet Take 1 tablet by mouth every 6 (six) hours as needed for moderate pain. 30 tablet 0   levothyroxine (SYNTHROID) 75 MCG tablet Take 75 mcg by mouth daily.     losartan (COZAAR) 25 MG tablet Take 1 tablet by mouth daily.     metoprolol tartrate (LOPRESSOR) 25 MG tablet Take 1 tablet (25 mg total) by mouth 2 (two) times daily. 60 tablet 11   omeprazole (PRILOSEC) 20 MG capsule Take 20 mg by mouth every morning.      tamsulosin (FLOMAX) 0.4 MG CAPS capsule Take 1 capsule (0.4 mg total) by mouth 2 (two) times daily. 60 capsule 11   No current facility-administered medications for this visit.    Allergies: No Known Allergies  Past Medical History:  Diagnosis Date   Benign prostatic hypertrophy    BPH (benign prostatic hypertrophy)    Coronary artery disease cardiologist-  dr Purvis Sheffield   a. NSTEMI 07/2012 => critical LM disease on LHC => s/p CABG (L-LAD/D1, S-OM2, S-PDA/PL);  EF 55-60% at [facility] 07/2012   Exercise tolerance test normal    Dec 2014  per dr Purvis Sheffield (cardiologist) note [facility] treadmilll score 5.5 (low risk)   GERD (gastroesophageal reflux disease)    History of atrial fibrillation    post-op  CABG 04/ 2014   History of non-ST elevation myocardial infarction (NSTEMI)    04/ 2014   Hyperlipidemia    Hypertension    Hypothyroidism  Ischemic heart disease    STABLE PER DR Purvis Sheffield (CARDIOLOGIST) NOTE 06-18-2015   Lower urinary tract symptoms (LUTS)    RBBB (right bundle branch block)    S/P CABG x 5    08-17-2012   LIMA sequentilly to LAD and Diagonal,  SVG to OM,  SVG seq to PDA and PLB with EVH from right thigh   Urethral stricture    recurrent   Wears dentures    UPPER   Wears glasses    Past Surgical History:  Procedure Laterality Date   CORONARY ARTERY BYPASS  GRAFT N/A 08/17/2012   Procedure: CORONARY ARTERY BYPASS GRAFTING (CABG);  Surgeon: Loreli Slot, MD;  Location: [facility] OR;  Service: Open Heart Surgery;  Laterality: N/A;  L-LAD/D1, S-OM2,  S-PDA/PL   CYSTOSCOPY N/A 08/17/2012   Procedure: CYSTOSCOPY FLEXIBLE for difficult  foley catheter insertion;  Surgeon: Sebastian Ache, MD;  Location: [facility] OR;  Service: Urology;  Laterality: N/A;   CYSTOSCOPY N/A 01/16/2016   Procedure: CYSTOSCOPY FLEXIBLE;  Surgeon: Sebastian Ache, MD;  Location[facility];  Service: Urology;  Laterality: N/A;   CYSTOSCOPY WITH LITHOLAPAXY N/A 10/31/2021   Procedure: CYSTOSCOPY WITH LITHOLAPAXY;  Surgeon: Malen Gauze, MD;  Location: [facility];  Service: Urology;  Laterality: N/A;   CYSTOSCOPY WITH URETHRAL DILATATION N/A 01/16/2016   Procedure: CYSTOSCOPY WITH URETHRAL BALLOON DILATATION;  Surgeon: Sebastian Ache, MD;  Location: [facility];  Service: Urology;  Laterality: N/A;   LEFT HEART CATHETERIZATION WITH CORONARY ANGIOGRAM N/A 08/16/2012   Procedure: LEFT HEART CATHETERIZATION WITH CORONARY ANGIOGRAM;  Surgeon: Tonny Bollman, MD;  Location: [facility] CATH LAB;  Service: Cardiovascular;  Laterality: N/A;  NSTEMI;  critical LM stenosis extending into the ostial LAD & LCFX,  moderately tight RCA stenosis,  Normal LVF, ef 55-60%   TRANSURETHRAL RESECTION OF PROSTATE  06/2012   Family History  Problem Relation Age of Onset   Coronary artery disease Father 52   Social History   Socioeconomic History   Marital status: Married    Spouse name: Not on file   Number of children: Not on file   Years of education: Not on file   Highest education level: Not on file  Occupational History   Not on file  Tobacco Use   Smoking status: Former    Packs/day: 0.50    Years: 10.00    Additional pack years: 0.00    Total pack years: 5.00    Types: Cigarettes    Start date: 08/05/1960    Quit date: 08/06/1970    Years since quitting: 52.2    Smokeless tobacco: Never  Vaping Use   Vaping Use: Never used  Substance and Sexual Activity   Alcohol use: No    Alcohol/week: 0.0 standard drinks of alcohol   Drug use: No   Sexual activity: Not on file  Other Topics Concern   Not on file  Social History Narrative   Not on file   Social Determinants of Health   Financial Resource Strain: Not on file  Food Insecurity: Not on file  Transportation Needs: No Transportation Needs (08/14/2022)   PRAPARE - Administrator, Civil Service (Medical): No    Lack of Transportation (Non-Medical): No  Physical Activity: Not on file  Stress: Not on file  Social Connections: Not on file  Intimate Partner Violence: Not on file    Review of Systems Constitutional: Patient ***denies any unintentional weight loss or change in strength lntegumentary: Patient ***  denies any rashes or pruritus Eyes: Patient denies ***dry eyes ENT: Patient ***denies dry mouth Cardiovascular: Patient ***denies chest pain or syncope Respiratory: Patient ***denies shortness of breath Gastrointestinal: Patient ***denies nausea, vomiting, constipation, or diarrhea Musculoskeletal: Patient ***denies muscle cramps or weakness Neurologic: Patient ***denies convulsions or seizures Psychiatric: Patient ***denies memory problems Allergic/Immunologic: Patient ***denies recent allergic reaction(s) Hematologic/Lymphatic: Patient denies bleeding tendencies Endocrine: Patient ***denies heat/cold intolerance  GU: As per HPI.  OBJECTIVE There were no vitals filed for this visit. There is no height or weight on file to calculate BMI.  Physical Examination  Constitutional: ***No obvious distress; patient is ***non-toxic appearing  Cardiovascular: ***No visible lower extremity edema.  Respiratory: The patient does ***not have audible wheezing/stridor; respirations do ***not appear labored  Gastrointestinal: Abdomen ***non-distended Musculoskeletal: ***Normal ROM of  UEs  Skin: ***No obvious rashes/open sores  Neurologic: CN 2-12 grossly ***intact Psychiatric: Answered questions ***appropriately with ***normal affect  Hematologic/Lymphatic/Immunologic: ***No obvious bruises or sites of spontaneous bleeding  UA: {Desc; negative/positive:13464} for *** WBC/hpf, *** RBC/hpf, bacteria (***) *** nitrites, *** leukocytes, *** blood PVR: *** ml  ASSESSMENT No diagnosis found. ***  Will plan for follow up in *** months / ***1 year or sooner if needed. Pt verbalized understanding and agreement. All questions were answered.  PLAN Advised the following: 1. *** 2. ***No follow-ups on file.  No orders of the defined types were placed in this encounter.   It has been explained that the patient is to follow regularly with their PCP in addition to all other providers involved in their care and to follow instructions provided by these respective offices. Patient advised to contact urology clinic if any urologic-pertaining questions, concerns, new symptoms or problems arise in the interim period.  There are no Patient Instructions on file for this visit.  Electronically signed by:  Donnita Falls, FNP   10/28/22    5:52 PM

## 2022-10-31 ENCOUNTER — Ambulatory Visit: Payer: Medicare HMO | Admitting: Urology

## 2022-10-31 DIAGNOSIS — N200 Calculus of kidney: Secondary | ICD-10-CM

## 2022-10-31 DIAGNOSIS — N361 Urethral diverticulum: Secondary | ICD-10-CM

## 2022-10-31 DIAGNOSIS — N99114 Postprocedural urethral stricture, male, unspecified: Secondary | ICD-10-CM

## 2022-10-31 DIAGNOSIS — N3289 Other specified disorders of bladder: Secondary | ICD-10-CM

## 2022-10-31 DIAGNOSIS — N401 Enlarged prostate with lower urinary tract symptoms: Secondary | ICD-10-CM

## 2022-10-31 DIAGNOSIS — N210 Calculus in bladder: Secondary | ICD-10-CM

## 2022-11-03 NOTE — Progress Notes (Signed)
Letter sent.

## 2022-11-12 DIAGNOSIS — I10 Essential (primary) hypertension: Secondary | ICD-10-CM | POA: Diagnosis not present

## 2022-11-12 DIAGNOSIS — I2510 Atherosclerotic heart disease of native coronary artery without angina pectoris: Secondary | ICD-10-CM | POA: Diagnosis not present

## 2022-11-18 DIAGNOSIS — I5032 Chronic diastolic (congestive) heart failure: Secondary | ICD-10-CM | POA: Diagnosis not present

## 2022-11-18 DIAGNOSIS — I10 Essential (primary) hypertension: Secondary | ICD-10-CM | POA: Diagnosis not present

## 2022-11-18 DIAGNOSIS — Z0001 Encounter for general adult medical examination with abnormal findings: Secondary | ICD-10-CM | POA: Diagnosis not present

## 2022-11-18 DIAGNOSIS — E785 Hyperlipidemia, unspecified: Secondary | ICD-10-CM | POA: Diagnosis not present

## 2022-11-19 ENCOUNTER — Ambulatory Visit: Payer: Medicare HMO | Admitting: Urology

## 2022-12-04 ENCOUNTER — Ambulatory Visit: Payer: Medicare HMO | Admitting: Urology

## 2022-12-13 DIAGNOSIS — I2510 Atherosclerotic heart disease of native coronary artery without angina pectoris: Secondary | ICD-10-CM | POA: Diagnosis not present

## 2022-12-13 DIAGNOSIS — I10 Essential (primary) hypertension: Secondary | ICD-10-CM | POA: Diagnosis not present

## 2022-12-31 NOTE — Progress Notes (Signed)
Name: Clayton Austin DOB: 1940-05-10 MRN: [id number]  History of Present Illness: Clayton Austin is a 82 y.o. male who presents today for follow up visit at [facility]. He is accompanied by his wife and sister-in-law. - GU History: 1. BPH. - 08/17/2012: Underwent TURP procedure by Dr. Berneice Heinrich. - Was taking Flomax 0.4 mg 2x/day and Proscar 5 mg daily but ran out a few months ago. 2. Urethral stricture (recurrent).  3. Kidney / bladder stones. - 10/31/2021: Underwent cystolithalopaxy and urethral dilation by Dr. Ronne Binning.  4. 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 5. Erectile dysfunction.  At last visit with Dr. Ronne Binning on 11/06/2021: Postop check s/p cystolithalopaxy and urethral dilation on 10/31/2021. Passed voiding trial; PVR = 5 ml.   Since last visit: - 08/02/2022: Seen in ER for A-fib, urinary retention secondary to constipation. Treated with enemas and Foley catheter.  - 08/08/2022: Seen in ER for gross hematuria and urinary frequency after Foley catheter removal 1 day prior. PVR = 106 ml per bladder scan and this was not done immediately after voiding. Creatinine 1.18; GFR 62. No leukocytosis (WBC 9.9). Antibiotic changed from Amoxicillin to Cefdinir to treat for suspected UTI. Urine culture came back negative.   - 10/27/2022: RUS showed no stones, masses, or hydronephrosis.   Today: He denies any episode of stone pain/ passage in the past year. He denies acute flank pain / abdominal pain.  He reports weak urinary stream. He reports urinary hesitancy, urgency, frequency, nocturia, and straining to void. Occasional urge incontinence. Denies dysuria, gross hematuria, or sensations of incomplete emptying.  Fall Screening: Do you usually have a device to assist in your mobility? Yes - cane   Medications: Current Outpatient Medications  Medication Sig Dispense Refill   amLODipine (NORVASC) 10 MG tablet Take 10 mg by mouth every morning.      aspirin EC 81  MG tablet Take 1 tablet (81 mg total) by mouth daily.     atorvastatin (LIPITOR) 20 MG tablet Take 20 mg by mouth every morning.      levothyroxine (SYNTHROID) 75 MCG tablet Take 75 mcg by mouth daily.     losartan (COZAAR) 25 MG tablet Take 1 tablet by mouth daily.     metoprolol tartrate (LOPRESSOR) 25 MG tablet Take 1 tablet (25 mg total) by mouth 2 (two) times daily. 60 tablet 11   nitrofurantoin, macrocrystal-monohydrate, (MACROBID) 100 MG capsule Take 1 capsule (100 mg total) by mouth 2 (two) times daily for 7 days. 14 capsule 0   omeprazole (PRILOSEC) 20 MG capsule Take 20 mg by mouth every morning.      finasteride (PROSCAR) 5 MG tablet Take 1 tablet (5 mg total) by mouth daily. 90 tablet 3   tamsulosin (FLOMAX) 0.4 MG CAPS capsule Take 1 capsule (0.4 mg total) by mouth 2 (two) times daily. 180 capsule 3   No current facility-administered medications for this visit.    Allergies: No Known Allergies  Past Medical History:  Diagnosis Date   Benign prostatic hypertrophy    BPH (benign prostatic hypertrophy)    Coronary artery disease cardiologist-  dr Purvis Sheffield   a. NSTEMI 07/2012 => critical LM disease on LHC => s/p CABG (L-LAD/D1, S-OM2, S-PDA/PL);  EF 55-60% at [facility] 07/2012   Exercise tolerance test normal    Dec 2014  per dr Purvis Sheffield (cardiologist) note [facility] treadmilll score 5.5 (low risk)   GERD (gastroesophageal reflux disease)    History of atrial fibrillation    post-op  CABG 04/ 2014   History of non-ST elevation myocardial infarction (NSTEMI)    04/ 2014   Hyperlipidemia    Hypertension    Hypothyroidism    Ischemic heart disease    STABLE PER DR Purvis Sheffield (CARDIOLOGIST) NOTE 06-18-2015   Lower urinary tract symptoms (LUTS)    RBBB (right bundle branch block)    S/P CABG x 5    08-17-2012   LIMA sequentilly to LAD and Diagonal,  SVG to OM,  SVG seq to PDA and PLB with EVH from right thigh   Urethral stricture    recurrent   Wears dentures    UPPER   Wears  glasses    Past Surgical History:  Procedure Laterality Date   CORONARY ARTERY BYPASS GRAFT N/A 08/17/2012   Procedure: CORONARY ARTERY BYPASS GRAFTING (CABG);  Surgeon: Loreli Slot, MD;  Location: [facility] OR;  Service: Open Heart Surgery;  Laterality: N/A;  L-LAD/D1, S-OM2,  S-PDA/PL   CYSTOSCOPY N/A 08/17/2012   Procedure: CYSTOSCOPY FLEXIBLE for difficult  foley catheter insertion;  Surgeon: Sebastian Ache, MD;  Location: [facility] OR;  Service: Urology;  Laterality: N/A;   CYSTOSCOPY N/A 01/16/2016   Procedure: CYSTOSCOPY FLEXIBLE;  Surgeon: Sebastian Ache, MD;  Location: [facility];  Service: Urology;  Laterality: N/A;   CYSTOSCOPY WITH LITHOLAPAXY N/A 10/31/2021   Procedure: CYSTOSCOPY WITH LITHOLAPAXY;  Surgeon: Malen Gauze, MD;  Location: [facility];  Service: Urology;  Laterality: N/A;   CYSTOSCOPY WITH URETHRAL DILATATION N/A 01/16/2016   Procedure: CYSTOSCOPY WITH URETHRAL BALLOON DILATATION;  Surgeon: Sebastian Ache, MD;  Location: [facility];  Service: Urology;  Laterality: N/A;   LEFT HEART CATHETERIZATION WITH CORONARY ANGIOGRAM N/A 08/16/2012   Procedure: LEFT HEART CATHETERIZATION WITH CORONARY ANGIOGRAM;  Surgeon: Tonny Bollman, MD;  Location: [facility] CATH LAB;  Service: Cardiovascular;  Laterality: N/A;  NSTEMI;  critical LM stenosis extending into the ostial LAD & LCFX,  moderately tight RCA stenosis,  Normal LVF, ef 55-60%   TRANSURETHRAL RESECTION OF PROSTATE  06/2012   Family History  Problem Relation Age of Onset   Coronary artery disease Father 42   Social History   Socioeconomic History   Marital status: Married    Spouse name: Not on file   Number of children: Not on file   Years of education: Not on file   Highest education level: Not on file  Occupational History   Not on file  Tobacco Use   Smoking status: Former    Current packs/day: 0.00    Average packs/day: 0.5 packs/day for 10.0 years (5.0 ttl pk-yrs)    Types: Cigarettes     Start date: 08/05/1960    Quit date: 08/06/1970    Years since quitting: 52.4   Smokeless tobacco: Never  Vaping Use   Vaping status: Never Used  Substance and Sexual Activity   Alcohol use: No    Alcohol/week: 0.0 standard drinks of alcohol   Drug use: No   Sexual activity: Not Currently  Other Topics Concern   Not on file  Social History Narrative   Not on file   Social Determinants of Health   Financial Resource Strain: Low Risk  (08/28/2022)   Received from [facility], [facility]   Overall Financial Resource Strain (CARDIA)    Difficulty of Paying Living Expenses: Not very hard  Food Insecurity: No Food Insecurity (08/28/2022)   Received from [facility], [facility]   Hunger Vital Sign    Worried About  Running Out of Food in the Last Year: Never true    Ran Out of Food in the Last Year: Never true  Transportation Needs: No Transportation Needs (08/28/2022)   Received from [facility], [facility]   PRAPARE - Transportation    Lack of Transportation (Medical): No    Lack of Transportation (Non-Medical): No  Physical Activity: Not on file  Stress: No Stress Concern Present (08/03/2022)   Received from [facility], [facility] of Occupational Health - Occupational Stress Questionnaire    Feeling of Stress : Not at all  Social Connections: Unknown (08/03/2022)   Received from [facility], [facility]   Social Network    Social Network: Not on file  Intimate Partner Violence: Not At Risk (08/03/2022)   Received from [facility], [facility]   HITS    Over the last 12 months how often did your partner physically hurt you?: 1    Over the last 12 months how often did your partner insult you or talk down to you?: 1    Over the last 12 months how often did your partner threaten you with physical harm?: 1    Over the last 12 months how often did your partner scream or curse at you?: 1    SUBJECTIVE  Review of  Systems Constitutional: Patient reports recent fatigue  lntegumentary: Patient denies any rashes or pruritus Cardiovascular: Patient denies chest pain or syncope Respiratory: Patient denies shortness of breath Gastrointestinal: Patient denies nausea, vomiting, constipation, or diarrhea Musculoskeletal: Patient denies muscle cramps or weakness Neurologic: Patient denies convulsions or seizures Psychiatric: Patient denies memory problems Allergic/Immunologic: Patient denies recent allergic reaction(s) Hematologic/Lymphatic: Patient denies bleeding tendencies Endocrine: Patient denies heat/cold intolerance  GU: As per HPI.  OBJECTIVE Vitals:   01/05/23 0938  BP: 108/69  Pulse: 85  Temp: 98 F (36.7 C)   There is no height or weight on file to calculate BMI.  Physical Examination Constitutional: No obvious distress; patient is non-toxic appearing  Cardiovascular: No visible lower extremity edema.  Respiratory: The patient does not have audible wheezing/stridor; respirations do not appear labored  Gastrointestinal: Palpable non-tender right inguinal hernia which is reducible  Musculoskeletal: Normal ROM of UEs  Skin: No obvious rashes/open sores  Neurologic: CN 2-12 grossly intact Psychiatric: Answered questions appropriately with normal affect  Hematologic/Lymphatic/Immunologic: No obvious bruises or sites of spontaneous bleeding  UA: >30 WBC/hpf, 3-10 RBC/hpf, bacteria (many) PVR: 0 ml Uroflow: Intermittent pattern  ASSESSMENT Kidney stones - Plan: Urinalysis, Routine w reflex microscopic, BLADDER SCAN AMB NON-IMAGING, PR COMPLEX UROFLOMETRY  Bladder stone - Plan: Urinalysis, Routine w reflex microscopic, BLADDER SCAN AMB NON-IMAGING, PR COMPLEX UROFLOMETRY  Benign prostatic hyperplasia with weak urinary stream - Plan: Urinalysis, Routine w reflex microscopic, BLADDER SCAN AMB NON-IMAGING, PR COMPLEX UROFLOMETRY  Decreased bladder capacity - Plan: Urinalysis, Routine w  reflex microscopic, BLADDER SCAN AMB NON-IMAGING, PR COMPLEX UROFLOMETRY  Urethral diverticulum - Plan: Urinalysis, Routine w reflex microscopic, BLADDER SCAN AMB NON-IMAGING, PR COMPLEX UROFLOMETRY  Benign prostatic hyperplasia with urinary obstruction - Plan: tamsulosin (FLOMAX) 0.4 MG CAPS capsule  Nocturia - Plan: tamsulosin (FLOMAX) 0.4 MG CAPS capsule, finasteride (PROSCAR) 5 MG tablet  Right inguinal hernia - Plan: US PELVIS LIMITED (TRANSABDOMINAL ONLY), Ambulatory referral to General Surgery  Abnormal urinalysis - Plan: nitrofurantoin, macrocrystal-monohydrate, (MACROBID) 100 MG capsule  Other fatigue - Plan: nitrofurantoin, macrocrystal-monohydrate, (MACROBID) 100 MG capsule  No significant evidence of recurrent urethral stricture or BPH per normal PVR  today. Advised to restart Flomax 0.4 mg 2x/day and Proscar 5 mg daily; refills sent.   We discussed suspected UTI based on his recent fatigue and abnormal UA. We agreed to check urine culture and treat empirically with Macrobid while awaiting culture results and sensitivities.  RUS 2 months ago was unremarkable, however discussed small possibility of ureteral stone. Advised KUB prior to next visit (ordered previously).   For right inguinal hernia, advised ultrasound for further evaluation and referral to General Surgery for surgical consultation.   Will plan to follow up with urology in 3 months. Pt verbalized understanding and agreement. All questions were answered.  PLAN Advised the following: Urine culture. Macrobid 100 mg 2x/day x7 days. Restart Flomax 0.4 mg 2x/day and Proscar 5 mg daily. KUB. Inguinal ultrasound. Referral placed to General Surgery. Return in about 3 months (around 04/06/2023) for UA, PVR, & f/u with Evette Georges NP.  Orders Placed This Encounter  Procedures   US PELVIS LIMITED (TRANSABDOMINAL ONLY)    Standing Status:   Future    Standing Expiration Date:   01/05/2024    Order Specific Question:    Reason for Exam (SYMPTOM  OR DIAGNOSIS REQUIRED)    Answer:   suspected right inguinal hernia    Order Specific Question:   Preferred imaging location?    Answer:   [facility]   Urinalysis, Routine w reflex microscopic   Ambulatory referral to General Surgery    Referral Priority:   Routine    Referral Type:   Surgical    Referral Reason:   Specialty Services Required    Requested Specialty:   General Surgery    Number of Visits Requested:   1   PR COMPLEX UROFLOMETRY   BLADDER SCAN AMB NON-IMAGING    It has been explained that the patient is to follow regularly with their PCP in addition to all other providers involved in their care and to follow instructions provided by these respective offices. Patient advised to contact urology clinic if any urologic-pertaining questions, concerns, new symptoms or problems arise in the interim period.  Patient Instructions  >80% of stones are calcium oxalate. This type of stones forms when body either isn't clearing oxalate well enough, is making too much oxalate, or too little citrate. This results in oxalate binding to form crystals, which continue to aggregate and form stones.  Limiting calcium does not help, but limiting oxalate in the diet can help. Increasing citric acid intake may also help.  The following measures may help to prevent the recurrence of stones: Increase water intake to 2-2.5 liters per day May add citrus juice (lemon, lime or orange juice) to water Moderation in dairy foods Decrease in salt content 5. Low Oxalate diet: Oxylates are found in foods like Tomato, Spinach, red wine and chocolate (see additional resources below).  Internet resources for information regarding low oxalate diet: https://kidneystones.yangchunwu.com https://my.VerticalStretch.be  Foods Low in Sodium or Oxalate Foods You Can Eat  Drinks Coffee, fruit and  veggie juice (using the recommended veggies), fruit punch  Fruits Apples, apricots (fresh or canned), avocado, bananas, cherries (sweet), cranberries, grapefruit, red or green grapes, lemon and lime juice, melons, nectarines, papayas, peaches, pears, pineapples, oranges, strawberries (fresh), tangerines  Veggies Artichokes, asparagus, bamboo shoots, broccoli, brussels sprouts, cabbage, cauliflower, chayote squash, chicory, corn, cucumbers, endive, lettuce, lima beans, mushrooms, onions, peas, peppers, potatoes, radishes, rutabagas, zucchini  Breads, Cereals, Grains Egg noodles, rye bread, cooked and dry cereals without nuts or bran, crackers with unsalted  tops, white or wild rice  Meat, Systems developer, Fish, [employer], fish, poultry, eggs, egg whites, egg replacements  Soup Homemade soup (using the recommended veggies and meat), low-sodium bouillon, low-sodium canned  Desserts Cookies, cakes, ice cream, pudding without chocolate or nuts, candy without chocolate or nuts  Fats and Oils Butter, margarine, cream, oil, salad dressing, mayo  Other Foods Unsalted potato chips or pretzels, herbs (like garlic, garlic powder, onion powder), lemon juice, salt-free seasoning blends, vinegar  Other Foods Low in Oxalate Foods You Can Eat  Drinks Beer, cola, wine, buttermilk, lemonade or limeade (without added vitamin C), milk  Meat, Meat Replacements, Fish, [employer] meat, ham, bacon, hot dogs, bratwurst, sausage, chicken nuggets, cheddar cheese, canned fish and shellfish  Soup Tomato soup, cheese soup  Other Foods Coconuts, lemon or lime juices, sugar or sweeteners, jellies or jams (from the recommended list)   Moderate-Oxalate Foods Foods to Limit   Drinks Fruit and veggie juices (from the list below), chocolate milk, rice milk, hot cocoa, tea   Fruits Blackberries, blueberries, black currants, cherries (sour), fruit cocktail, mangoes, orange peel, prunes, purple plums   Veggies Baked beans,  carrots, celery, green beans, parsnips, summer squash, tomatoes, turnips   Breads, Cereals, Grains White bread, cornbread or cornmeal, white English muffins, saltine or soda crackers, brown rice, vanilla wafers, spaghetti and other noodles, firm tofu, bagels, oatmeal   Meat/meat replacements, fish, poultry Sardines   Desserts Chocolate cake   Fats and Oils Macadamia nuts, pistachio nuts, English walnuts   Other Foods Jams or jellies (made with the fruits above), pepper    High-Oxalate Foods Foods to Avoid Drinks Chocolate drink mixes, soy milk, Ovaltine, instant iced tea, fruit juices of fruits listed below Fruits Apricots (dried), red currants, figs, kiwi, plums, rhubarb Veggies Beans (wax, dried), beets and beet greens, chives, collard greens, eggplant, escarole, dark greens of all kinds, leeks, okra, parsley, rutabagas, spinach, Swiss chard, tomato paste, watercress Breads, Cereals, Grains Amaranth, barley, white corn flour, fried potatoes, fruitcake, grits, soybean products, sweet potatoes, wheat germ and bran, buckwheat flour, All Bran cereal, graham crackers, pretzels, whole wheat bread Meat/meat replacements, fish, poultry  Dried beans, peanut butter, soy burgers, miso  Desserts Carob, chocolate, marmalades Fats and Oils Nuts (peanuts, almonds, pecans, cashews, hazelnuts), nut butters, sesame seeds, tahini paste Other Foods Poppy seeds   Electronically signed by:  Donnita Falls, MSN, FNP-C, CUNP 01/05/2023 10:27 AM

## 2023-01-05 ENCOUNTER — Encounter: Payer: Self-pay | Admitting: Urology

## 2023-01-05 ENCOUNTER — Ambulatory Visit: Payer: Medicare HMO | Admitting: Urology

## 2023-01-05 ENCOUNTER — Ambulatory Visit (INDEPENDENT_AMBULATORY_CARE_PROVIDER_SITE_OTHER): Payer: Medicare HMO | Admitting: Urology

## 2023-01-05 VITALS — BP 108/69 | HR 85 | Temp 98.0°F

## 2023-01-05 DIAGNOSIS — N210 Calculus in bladder: Secondary | ICD-10-CM

## 2023-01-05 DIAGNOSIS — N200 Calculus of kidney: Secondary | ICD-10-CM | POA: Diagnosis not present

## 2023-01-05 DIAGNOSIS — N401 Enlarged prostate with lower urinary tract symptoms: Secondary | ICD-10-CM | POA: Diagnosis not present

## 2023-01-05 DIAGNOSIS — R3912 Poor urinary stream: Secondary | ICD-10-CM

## 2023-01-05 DIAGNOSIS — Z87442 Personal history of urinary calculi: Secondary | ICD-10-CM | POA: Diagnosis not present

## 2023-01-05 DIAGNOSIS — N138 Other obstructive and reflux uropathy: Secondary | ICD-10-CM

## 2023-01-05 DIAGNOSIS — N361 Urethral diverticulum: Secondary | ICD-10-CM | POA: Diagnosis not present

## 2023-01-05 DIAGNOSIS — R5383 Other fatigue: Secondary | ICD-10-CM

## 2023-01-05 DIAGNOSIS — R351 Nocturia: Secondary | ICD-10-CM | POA: Diagnosis not present

## 2023-01-05 DIAGNOSIS — N3289 Other specified disorders of bladder: Secondary | ICD-10-CM | POA: Diagnosis not present

## 2023-01-05 DIAGNOSIS — R8290 Unspecified abnormal findings in urine: Secondary | ICD-10-CM

## 2023-01-05 DIAGNOSIS — K4090 Unilateral inguinal hernia, without obstruction or gangrene, not specified as recurrent: Secondary | ICD-10-CM | POA: Diagnosis not present

## 2023-01-05 LAB — MICROSCOPIC EXAMINATION: WBC, UA: >30 /HPF — AB (ref 0–5)

## 2023-01-05 LAB — URINALYSIS, ROUTINE W REFLEX MICROSCOPIC
Bilirubin, UA: NEGATIVE
Glucose, UA: NEGATIVE
Ketones, UA: NEGATIVE
Nitrite, UA: POSITIVE — AB
Specific Gravity, UA: 1.02 (ref 1.005–1.030)
Urobilinogen, Ur: 1 mg/dL (ref 0.2–1.0)
pH, UA: 6 (ref 5.0–7.5)

## 2023-01-05 LAB — BLADDER SCAN AMB NON-IMAGING: Scan Result: 0

## 2023-01-05 MED ORDER — NITROFURANTOIN MONOHYD MACRO 100 MG PO CAPS: 100.0000 mg | ORAL_CAPSULE | Freq: Two times a day (BID) | ORAL | 0 refills | Status: DC

## 2023-01-05 MED ORDER — FINASTERIDE 5 MG PO TABS
5.0000 mg | ORAL_TABLET | Freq: Every day | ORAL | 3 refills | Status: DC
Start: 2023-01-05 — End: 2023-06-26

## 2023-01-05 MED ORDER — TAMSULOSIN HCL 0.4 MG PO CAPS
0.4000 mg | ORAL_CAPSULE | Freq: Two times a day (BID) | ORAL | 3 refills | Status: DC
Start: 2023-01-05 — End: 2023-06-26

## 2023-01-05 NOTE — Progress Notes (Signed)
Uroflow  Peak Flow: 62ml Average Flow: 6ml Voided Volume: 25ml Voiding Time: 13sec Flow Time: 4sec Time to Peak Flow: 1sec  PVR Volume: 0ml

## 2023-01-05 NOTE — Patient Instructions (Signed)

## 2023-01-05 NOTE — Addendum Note (Signed)
Addended by: Christoper Fabian R on: 01/05/2023 11:09 AM   Modules accepted: Orders

## 2023-01-08 ENCOUNTER — Other Ambulatory Visit: Payer: Self-pay | Admitting: Urology

## 2023-01-08 DIAGNOSIS — N3000 Acute cystitis without hematuria: Secondary | ICD-10-CM

## 2023-01-08 LAB — URINE CULTURE

## 2023-01-08 MED ORDER — CIPROFLOXACIN HCL 500 MG PO TABS
500.0000 mg | ORAL_TABLET | Freq: Two times a day (BID) | ORAL | 0 refills | Status: AC
Start: 2023-01-08 — End: 2023-01-15

## 2023-01-09 ENCOUNTER — Telehealth: Payer: Self-pay

## 2023-01-09 NOTE — Telephone Encounter (Signed)
Patient's wife is made aware and voiced understanding.

## 2023-01-09 NOTE — Telephone Encounter (Signed)
-----   Message from Donnita Falls sent at 01/08/2023  4:05 PM EDT ----- Please let pt know urine culture result. Resistant to Macrobid, which was prescribed empirically at his OV on 01/05/2023. Advised to discontinue Macrobid & start Ciprofloxacin today (prescription sent). Thanks.

## 2023-01-19 DIAGNOSIS — M1990 Unspecified osteoarthritis, unspecified site: Secondary | ICD-10-CM | POA: Diagnosis not present

## 2023-01-19 DIAGNOSIS — I480 Paroxysmal atrial fibrillation: Secondary | ICD-10-CM | POA: Diagnosis not present

## 2023-01-19 DIAGNOSIS — N401 Enlarged prostate with lower urinary tract symptoms: Secondary | ICD-10-CM | POA: Diagnosis not present

## 2023-01-19 DIAGNOSIS — I10 Essential (primary) hypertension: Secondary | ICD-10-CM | POA: Diagnosis not present

## 2023-01-19 DIAGNOSIS — Z23 Encounter for immunization: Secondary | ICD-10-CM | POA: Diagnosis not present

## 2023-02-02 DIAGNOSIS — H2510 Age-related nuclear cataract, unspecified eye: Secondary | ICD-10-CM | POA: Diagnosis not present

## 2023-02-02 DIAGNOSIS — H524 Presbyopia: Secondary | ICD-10-CM | POA: Diagnosis not present

## 2023-02-02 DIAGNOSIS — H353131 Nonexudative age-related macular degeneration, bilateral, early dry stage: Secondary | ICD-10-CM | POA: Diagnosis not present

## 2023-02-18 DIAGNOSIS — I10 Essential (primary) hypertension: Secondary | ICD-10-CM | POA: Diagnosis not present

## 2023-02-18 DIAGNOSIS — I2510 Atherosclerotic heart disease of native coronary artery without angina pectoris: Secondary | ICD-10-CM | POA: Diagnosis not present

## 2023-02-25 NOTE — Progress Notes (Signed)
Updated medications and medical hx

## 2023-03-05 ENCOUNTER — Ambulatory Visit: Payer: Medicare HMO | Admitting: Internal Medicine

## 2023-03-18 ENCOUNTER — Telehealth: Payer: Self-pay | Admitting: Urology

## 2023-03-18 NOTE — Telephone Encounter (Signed)
Patient's wife is aware of NP's response.  Patient states he did not see the need to go to the ER at this time, he will keep follow up on 12/16 as scheduled.

## 2023-03-18 NOTE — Telephone Encounter (Signed)
Please see pt concern below

## 2023-03-18 NOTE — Telephone Encounter (Signed)
Patient wife called patient is having blood in his urine and she wants to know could this be caused by too much medication

## 2023-03-21 DIAGNOSIS — I10 Essential (primary) hypertension: Secondary | ICD-10-CM | POA: Diagnosis not present

## 2023-03-21 DIAGNOSIS — I2510 Atherosclerotic heart disease of native coronary artery without angina pectoris: Secondary | ICD-10-CM | POA: Diagnosis not present

## 2023-04-02 NOTE — Progress Notes (Signed)
Name: Clayton Austin DOB: 07-15-1940 MRN: [id number]  History of Present Illness: Clayton Austin is a 82 y.o. male who presents today for follow up visit at [facility] . He is accompanied by his wife. - GU history: 1. BPH with LUTS (weak urinary stream, hesitancy, urgency, frequency, nocturia, straining to void, occasional urge incontinence). - 08/17/2012: Underwent TURP procedure by Dr. Berneice Heinrich. - 08/02/2022: Episode of urinary retention secondary to constipation. Treated with enemas and Foley catheter in ER.  - Taking Flomax 0.4 mg 2x/day and Proscar 5 mg daily. - Denies caffeine intake. 2. Urethral stricture (recurrent). - 10/31/2021: Underwent urethral dilation by Dr. Ronne Binning. 3. Kidney / bladder stones. - 10/31/2021: Underwent cystolithalopaxy by Dr. Ronne Binning.  - 10/27/2022: RUS showed no stones, masses, or hydronephrosis.  4. 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 5. Erectile dysfunction.   Urine culture results in past 12 months: - 08/02/2022: Positive for Aerococcus urinae - 08/08/2022: Negative - 01/05/2023: Positive for Pseudomonas aeruginosa  At last visit on 01/05/2023: The plan was:  For suspected UTI: - Urine culture. Positive for Pseudomonas aeruginosa. Resistant to Macrobid, which was prescribed empirically. Switched to Cipro. For BPH with LUTS:  - Restart Flomax 0.4 mg 2x/day and Proscar 5 mg daily. Stone surveillance: - KUB. For right inguinal hernia: - Inguinal ultrasound. - Referral placed to General Surgery.  Today: He reports intermittent pink-tinged urine over the past 2 weeks. He denies increased urinary urgency, frequency, dysuria, gross hematuria, hesitancy, straining to void, or sensations of incomplete emptying. He denies recent episode of stone pain / passage. He denies acute flank pain / abdominal pain. Denies fevers. Reports feeling more tired and weak recently.  His wife reports that he didn't finish the entire course of  antibiotics prescribed for his UTI in September 2024 and questions if that may have led to a recurrent / relapsing UTI.    Fall Screening: Do you usually have a device to assist in your mobility? Yes - wheelchair  Medications: Current Outpatient Medications  Medication Sig Dispense Refill   amLODipine (NORVASC) 10 MG tablet Take 10 mg by mouth every morning.      apixaban (ELIQUIS) 5 MG TABS tablet Take 5 mg by mouth 2 (two) times daily.     aspirin EC 81 MG tablet Take 1 tablet (81 mg total) by mouth daily.     atorvastatin (LIPITOR) 20 MG tablet Take 20 mg by mouth every morning.      cefdinir (OMNICEF) 300 MG capsule Take 1 capsule (300 mg total) by mouth 2 (two) times daily for 7 days. 14 capsule 0   diclofenac Sodium (VOLTAREN) 1 % GEL Apply 1 g topically 4 (four) times daily.     finasteride (PROSCAR) 5 MG tablet Take 1 tablet (5 mg total) by mouth daily. 90 tablet 3   ibuprofen (ADVIL) 600 MG tablet Take 600 mg by mouth every 8 (eight) hours as needed.     levothyroxine (SYNTHROID) 75 MCG tablet Take 75 mcg by mouth daily.     losartan (COZAAR) 25 MG tablet Take 1 tablet by mouth daily.     metoprolol tartrate (LOPRESSOR) 25 MG tablet Take 1 tablet (25 mg total) by mouth 2 (two) times daily. 60 tablet 11   omeprazole (PRILOSEC) 20 MG capsule Take 20 mg by mouth every morning.      potassium chloride SA (KLOR-CON M) 20 MEQ tablet Take 20 mEq by mouth daily.     tamsulosin (FLOMAX) 0.4 MG CAPS capsule  Take 1 capsule (0.4 mg total) by mouth 2 (two) times daily. 180 capsule 3   No current facility-administered medications for this visit.    Allergies: No Known Allergies  Past Medical History:  Diagnosis Date   Benign prostatic hypertrophy    BPH (benign prostatic hypertrophy)    Coronary artery disease cardiologist-  dr Purvis Sheffield   a. NSTEMI 07/2012 => critical LM disease on LHC => s/p CABG (L-LAD/D1, S-OM2, S-PDA/PL);  EF 55-60% at [facility] 07/2012   Exercise tolerance test normal     Dec 2014  per dr Purvis Sheffield (cardiologist) note [facility] treadmilll score 5.5 (low risk)   GERD (gastroesophageal reflux disease)    History of atrial fibrillation    post-op  CABG 04/ 2014   History of non-ST elevation myocardial infarction (NSTEMI)    04/ 2014   Hyperlipidemia    Hypertension    Hypothyroidism    Ischemic heart disease    STABLE PER DR Purvis Sheffield (CARDIOLOGIST) NOTE 06-18-2015   Lower urinary tract symptoms (LUTS)    RBBB (right bundle branch block)    S/P CABG x 5    08-17-2012   LIMA sequentilly to LAD and Diagonal,  SVG to OM,  SVG seq to PDA and PLB with EVH from right thigh   Urethral stricture    recurrent   Wears dentures    UPPER   Wears glasses    Past Surgical History:  Procedure Laterality Date   CORONARY ARTERY BYPASS GRAFT N/A 08/17/2012   Procedure: CORONARY ARTERY BYPASS GRAFTING (CABG);  Surgeon: Loreli Slot, MD;  Location: [facility] OR;  Service: Open Heart Surgery;  Laterality: N/A;  L-LAD/D1, S-OM2,  S-PDA/PL   CYSTOSCOPY N/A 08/17/2012   Procedure: CYSTOSCOPY FLEXIBLE for difficult  foley catheter insertion;  Surgeon: Sebastian Ache, MD;  Location: [facility] OR;  Service: Urology;  Laterality: N/A;   CYSTOSCOPY N/A 01/16/2016   Procedure: CYSTOSCOPY FLEXIBLE;  Surgeon: Sebastian Ache, MD;  Location: [facility];  Service: Urology;  Laterality: N/A;   CYSTOSCOPY WITH LITHOLAPAXY N/A 10/31/2021   Procedure: CYSTOSCOPY WITH LITHOLAPAXY;  Surgeon: Malen Gauze, MD;  Location: [facility];  Service: Urology;  Laterality: N/A;   CYSTOSCOPY WITH URETHRAL DILATATION N/A 01/16/2016   Procedure: CYSTOSCOPY WITH URETHRAL BALLOON DILATATION;  Surgeon: Sebastian Ache, MD;  Location: [facility];  Service: Urology;  Laterality: N/A;   LEFT HEART CATHETERIZATION WITH CORONARY ANGIOGRAM N/A 08/16/2012   Procedure: LEFT HEART CATHETERIZATION WITH CORONARY ANGIOGRAM;  Surgeon: Tonny Bollman, MD;  Location: [facility] CATH LAB;  Service:  Cardiovascular;  Laterality: N/A;  NSTEMI;  critical LM stenosis extending into the ostial LAD & LCFX,  moderately tight RCA stenosis,  Normal LVF, ef 55-60%   TRANSURETHRAL RESECTION OF PROSTATE  06/2012   Family History  Problem Relation Age of Onset   Coronary artery disease Father 49   Social History   Socioeconomic History   Marital status: Married    Spouse name: Not on file   Number of children: Not on file   Years of education: Not on file   Highest education level: Not on file  Occupational History   Not on file  Tobacco Use   Smoking status: Former    Current packs/day: 0.00    Average packs/day: 0.5 packs/day for 10.0 years (5.0 ttl pk-yrs)    Types: Cigarettes    Start date: 08/05/1960    Quit date: 08/06/1970    Years since quitting: 52.7   Smokeless tobacco:  Never  Vaping Use   Vaping status: Never Used  Substance and Sexual Activity   Alcohol use: No    Alcohol/week: 0.0 standard drinks of alcohol   Drug use: No   Sexual activity: Not Currently  Other Topics Concern   Not on file  Social History Narrative   Not on file   Social Drivers of Health   Financial Resource Strain: Low Risk  (08/28/2022)   Received from [facility]   Overall Financial Resource Strain (CARDIA)    Difficulty of Paying Living Expenses: Not very hard  Food Insecurity: No Food Insecurity (08/28/2022)   Received from [facility], [facility]   Hunger Vital Sign    Worried About Running Out of Food in the Last Year: Never true    Ran Out of Food in the Last Year: Never true  Transportation Needs: No Transportation Needs (08/28/2022)   Received from [employer], [facility]   PRAPARE - Transportation    Lack of Transportation (Medical): No    Lack of Transportation (Non-Medical): No  Physical Activity: Not on file  Stress: No Stress Concern Present (08/03/2022)   Received from [facility], [facility] - Occupational  Stress Questionnaire    Feeling of Stress : Not at all  Social Connections: Unknown (08/03/2022)   Received from [facility], [facility]   Social Network    Social Network: Not on file  Intimate Partner Violence: Not At Risk (08/03/2022)   Received from [facility], [facility]   HITS    Over the last 12 months how often did your partner physically hurt you?: Never    Over the last 12 months how often did your partner insult you or talk down to you?: Never    Over the last 12 months how often did your partner threaten you with physical harm?: Never    Over the last 12 months how often did your partner scream or curse at you?: Never    Review of Systems Constitutional: Patient denies any unintentional weight loss or change in strength lntegumentary: Patient denies any rashes or pruritus Cardiovascular: Patient denies chest pain or syncope Respiratory: Patient denies shortness of breath Gastrointestinal: Patient denies nausea, vomiting, constipation, or diarrhea Musculoskeletal: Patient denies muscle cramps or weakness Neurologic: Patient denies convulsions or seizures Allergic/Immunologic: Patient denies recent allergic reaction(s) Hematologic/Lymphatic: Patient denies bleeding tendencies Endocrine: Patient denies heat/cold intolerance  GU: As per HPI.  OBJECTIVE Vitals:   04/06/23 1343  BP: 132/66  Pulse: 71   Body mass index is 21.06 kg/m.  Physical Examination Constitutional: No obvious distress; patient is non-toxic appearing  Cardiovascular: No visible lower extremity edema.  Respiratory: The patient does not have audible wheezing/stridor; respirations do not appear labored  Gastrointestinal: Abdomen non-distended Musculoskeletal: Normal ROM of UEs  Skin: No obvious rashes/open sores  Neurologic: CN 2-12 grossly intact Psychiatric: Answered questions appropriately with normal affect  Hematologic/Lymphatic/Immunologic: No obvious bruises or sites of  spontaneous bleeding  UA: 11-30 WBC/hpf, >30 RBC/hpf, many bacteria PVR: 37 ml  ASSESSMENT History of hematuria - Plan: Urine culture, cefdinir (OMNICEF) 300 MG capsule, DISCONTINUED: cephALEXin (KEFLEX) 500 MG capsule  Benign prostatic hyperplasia with weak urinary stream - Plan: Urinalysis, Routine w reflex microscopic, BLADDER SCAN AMB NON-IMAGING, Urine culture, DISCONTINUED: cephALEXin (KEFLEX) 500 MG capsule  Urethral diverticulum - Plan: Urinalysis, Routine w reflex microscopic, BLADDER SCAN AMB NON-IMAGING  Postprocedural anterior urethral stricture - Plan: Urinalysis, Routine w reflex  microscopic, BLADDER SCAN AMB NON-IMAGING  Kidney stones - Plan: Urinalysis, Routine w reflex microscopic, BLADDER SCAN AMB NON-IMAGING  Bladder stone - Plan: Urinalysis, Routine w reflex microscopic, BLADDER SCAN AMB NON-IMAGING  Nocturia - Plan: Urinalysis, Routine w reflex microscopic, BLADDER SCAN AMB NON-IMAGING  Erectile dysfunction due to diseases classified elsewhere - Plan: Urinalysis, Routine w reflex microscopic, BLADDER SCAN AMB NON-IMAGING  Former smoker  History of UTI - Plan: Urine culture, cefdinir (OMNICEF) 300 MG capsule, DISCONTINUED: cephALEXin (KEFLEX) 500 MG capsule  Abnormal urinalysis - Plan: Urine culture, cefdinir (OMNICEF) 300 MG capsule, DISCONTINUED: cephALEXin (KEFLEX) 500 MG capsule  We discussed recurring vs relapsing UTI as suspected etiology for his current symptoms. Will check urine culture and treat empirically with Cefdinir while awaiting culture results and sensitivities. If urine culture comes back negative however we discussed that CT hematuria protocol and cystoscopy may be advised to further evaluation. Will plan for follow up in 3 months with Dr. Ronne Binning or sooner if needed. Pt verbalized understanding and agreement. All questions were answered.  PLAN Advised the following: 1. Urine culture. 2. Cefdinir 300 mg 3x/day x7 days. 3. Continue Flomax 0.4  mg 2x/day and Proscar 5 mg daily. 4. Continue minimal caffeine intake. 5. Return in about 3 months (around 07/05/2023) for f/u with Dr. Ronne Binning.  Orders Placed This Encounter  Procedures   Urine culture   Urinalysis, Routine w reflex microscopic   BLADDER SCAN AMB NON-IMAGING    It has been explained that the patient is to follow regularly with their PCP in addition to all other providers involved in their care and to follow instructions provided by these respective offices. Patient advised to contact urology clinic if any urologic-pertaining questions, concerns, new symptoms or problems arise in the interim period.  There are no Patient Instructions on file for this visit.  Electronically signed by:  Donnita Falls, FNP   04/06/23    2:11 PM

## 2023-04-06 ENCOUNTER — Ambulatory Visit: Payer: Medicare HMO | Admitting: Urology

## 2023-04-06 ENCOUNTER — Encounter: Payer: Self-pay | Admitting: Urology

## 2023-04-06 VITALS — BP 132/66 | HR 71 | Ht 71.0 in | Wt 151.0 lb

## 2023-04-06 DIAGNOSIS — N210 Calculus in bladder: Secondary | ICD-10-CM

## 2023-04-06 DIAGNOSIS — R3912 Poor urinary stream: Secondary | ICD-10-CM

## 2023-04-06 DIAGNOSIS — Z87448 Personal history of other diseases of urinary system: Secondary | ICD-10-CM

## 2023-04-06 DIAGNOSIS — R8290 Unspecified abnormal findings in urine: Secondary | ICD-10-CM | POA: Diagnosis not present

## 2023-04-06 DIAGNOSIS — R351 Nocturia: Secondary | ICD-10-CM | POA: Diagnosis not present

## 2023-04-06 DIAGNOSIS — Z87440 Personal history of urinary (tract) infections: Secondary | ICD-10-CM | POA: Diagnosis not present

## 2023-04-06 DIAGNOSIS — N200 Calculus of kidney: Secondary | ICD-10-CM

## 2023-04-06 DIAGNOSIS — N99114 Postprocedural urethral stricture, male, unspecified: Secondary | ICD-10-CM | POA: Diagnosis not present

## 2023-04-06 DIAGNOSIS — N361 Urethral diverticulum: Secondary | ICD-10-CM

## 2023-04-06 DIAGNOSIS — N521 Erectile dysfunction due to diseases classified elsewhere: Secondary | ICD-10-CM

## 2023-04-06 DIAGNOSIS — Z87891 Personal history of nicotine dependence: Secondary | ICD-10-CM | POA: Insufficient documentation

## 2023-04-06 DIAGNOSIS — Z87898 Personal history of other specified conditions: Secondary | ICD-10-CM | POA: Diagnosis not present

## 2023-04-06 DIAGNOSIS — N401 Enlarged prostate with lower urinary tract symptoms: Secondary | ICD-10-CM | POA: Diagnosis not present

## 2023-04-06 LAB — BLADDER SCAN AMB NON-IMAGING: Scan Result: 37

## 2023-04-06 MED ORDER — CEFDINIR 300 MG PO CAPS
300.0000 mg | ORAL_CAPSULE | Freq: Two times a day (BID) | ORAL | 0 refills | Status: AC
Start: 2023-04-06 — End: 2023-04-13

## 2023-04-06 MED ORDER — CEPHALEXIN 500 MG PO CAPS
500.0000 mg | ORAL_CAPSULE | Freq: Three times a day (TID) | ORAL | 0 refills | Status: DC
Start: 2023-04-06 — End: 2023-04-06

## 2023-04-06 NOTE — Progress Notes (Signed)
post void residual= 37 ml  

## 2023-04-07 LAB — URINALYSIS, ROUTINE W REFLEX MICROSCOPIC
Bilirubin, UA: NEGATIVE
Glucose, UA: NEGATIVE
Nitrite, UA: POSITIVE — AB
Specific Gravity, UA: 1.025 (ref 1.005–1.030)
Urobilinogen, Ur: 1 mg/dL (ref 0.2–1.0)
pH, UA: 6.5 (ref 5.0–7.5)

## 2023-04-07 LAB — MICROSCOPIC EXAMINATION: RBC, Urine: >30 /[HPF] — AB (ref 0–2)

## 2023-04-09 ENCOUNTER — Telehealth: Payer: Self-pay

## 2023-04-09 LAB — URINE CULTURE

## 2023-04-09 NOTE — Telephone Encounter (Signed)
Tried calling patient with no answer, left voice message for return call to the office.

## 2023-04-09 NOTE — Telephone Encounter (Signed)
-----   Message from Donnita Falls sent at 04/09/2023  4:32 PM EST ----- Please notify patient: Urine culture grew lactobacillus, which is likely a skin contaminant. Advised to follow up as planned with Dr. Ronne Binning in March 2025.

## 2023-04-10 NOTE — Telephone Encounter (Signed)
Patient is made aware and voiced understanding. 

## 2023-04-20 DIAGNOSIS — I2510 Atherosclerotic heart disease of native coronary artery without angina pectoris: Secondary | ICD-10-CM | POA: Diagnosis not present

## 2023-04-20 DIAGNOSIS — I10 Essential (primary) hypertension: Secondary | ICD-10-CM | POA: Diagnosis not present

## 2023-05-05 ENCOUNTER — Ambulatory Visit: Payer: Medicare HMO | Admitting: Internal Medicine

## 2023-05-21 DIAGNOSIS — I2510 Atherosclerotic heart disease of native coronary artery without angina pectoris: Secondary | ICD-10-CM | POA: Diagnosis not present

## 2023-05-21 DIAGNOSIS — I10 Essential (primary) hypertension: Secondary | ICD-10-CM | POA: Diagnosis not present

## 2023-05-28 DIAGNOSIS — I10 Essential (primary) hypertension: Secondary | ICD-10-CM | POA: Diagnosis not present

## 2023-05-28 DIAGNOSIS — R600 Localized edema: Secondary | ICD-10-CM | POA: Diagnosis not present

## 2023-05-28 DIAGNOSIS — N390 Urinary tract infection, site not specified: Secondary | ICD-10-CM | POA: Diagnosis not present

## 2023-06-26 ENCOUNTER — Ambulatory Visit (INDEPENDENT_AMBULATORY_CARE_PROVIDER_SITE_OTHER): Payer: Medicare HMO | Admitting: Urology

## 2023-06-26 VITALS — BP 107/67 | HR 73

## 2023-06-26 DIAGNOSIS — Z87442 Personal history of urinary calculi: Secondary | ICD-10-CM | POA: Diagnosis not present

## 2023-06-26 DIAGNOSIS — R351 Nocturia: Secondary | ICD-10-CM | POA: Diagnosis not present

## 2023-06-26 DIAGNOSIS — R3912 Poor urinary stream: Secondary | ICD-10-CM

## 2023-06-26 DIAGNOSIS — N138 Other obstructive and reflux uropathy: Secondary | ICD-10-CM

## 2023-06-26 DIAGNOSIS — N401 Enlarged prostate with lower urinary tract symptoms: Secondary | ICD-10-CM

## 2023-06-26 DIAGNOSIS — N210 Calculus in bladder: Secondary | ICD-10-CM

## 2023-06-26 DIAGNOSIS — K4090 Unilateral inguinal hernia, without obstruction or gangrene, not specified as recurrent: Secondary | ICD-10-CM

## 2023-06-26 DIAGNOSIS — N3000 Acute cystitis without hematuria: Secondary | ICD-10-CM | POA: Diagnosis not present

## 2023-06-26 LAB — MICROSCOPIC EXAMINATION
RBC, Urine: >30 /HPF — ABNORMAL HIGH (ref 0–2)
WBC, UA: >30 /HPF — ABNORMAL HIGH (ref 0–5)

## 2023-06-26 LAB — URINALYSIS, ROUTINE W REFLEX MICROSCOPIC
Bilirubin, UA: NEGATIVE
Glucose, UA: NEGATIVE
Ketones, UA: NEGATIVE
Nitrite, UA: NEGATIVE
Specific Gravity, UA: 1.025 (ref 1.005–1.030)
Urobilinogen, Ur: 0.2 mg/dL (ref 0.2–1.0)
pH, UA: 6.5 (ref 5.0–7.5)

## 2023-06-26 LAB — BLADDER SCAN AMB NON-IMAGING: Scan Result: 69

## 2023-06-26 MED ORDER — TAMSULOSIN HCL 0.4 MG PO CAPS
0.4000 mg | ORAL_CAPSULE | Freq: Two times a day (BID) | ORAL | 3 refills | Status: AC
Start: 2023-06-26 — End: ?

## 2023-06-26 MED ORDER — FINASTERIDE 5 MG PO TABS
5.0000 mg | ORAL_TABLET | Freq: Every day | ORAL | 3 refills | Status: AC
Start: 2023-06-26 — End: ?

## 2023-06-26 MED ORDER — DOXYCYCLINE HYCLATE 100 MG PO CAPS
100.0000 mg | ORAL_CAPSULE | Freq: Two times a day (BID) | ORAL | 0 refills | Status: DC
Start: 2023-06-26 — End: 2023-12-26

## 2023-06-26 NOTE — Progress Notes (Signed)
 Bladder Scan completed today.  Patient can void prior to the bladder scan. Bladder scan result: 69  Performed By: [facility] LPN

## 2023-06-26 NOTE — Progress Notes (Signed)
 06/26/2023 12:44 PM   Loann Quill 1941/04/15 [id number]  Referring provider: Benetta Spar, MD [address] [place],  [place] [address]  Followup BPH   HPI: Mr Jolley is a 83yo here for followup for BPh with incomplete emptying, bladder calculi and right inguinal hernia. PVR 69cc. He notes increased pain with urination fro the past month. He has not seen any bladder calculi. No straining to urinate. No gross hematuria. No other complaints today   PMH: Past Medical History:  Diagnosis Date   Benign prostatic hypertrophy    BPH (benign prostatic hypertrophy)    Coronary artery disease cardiologist-  dr Purvis Sheffield   a. NSTEMI 07/2012 => critical LM disease on LHC => s/p CABG (L-LAD/D1, S-OM2, S-PDA/PL);  EF 55-60% at [facility] 07/2012   Exercise tolerance test normal    Dec 2014  per dr Purvis Sheffield (cardiologist) note [facility] treadmilll score 5.5 (low risk)   GERD (gastroesophageal reflux disease)    History of atrial fibrillation    post-op  CABG 04/ 2014   History of non-ST elevation myocardial infarction (NSTEMI)    04/ 2014   Hyperlipidemia    Hypertension    Hypothyroidism    Ischemic heart disease    STABLE PER DR Purvis Sheffield (CARDIOLOGIST) NOTE 06-18-2015   Lower urinary tract symptoms (LUTS)    RBBB (right bundle branch block)    S/P CABG x 5    08-17-2012   LIMA sequentilly to LAD and Diagonal,  SVG to OM,  SVG seq to PDA and PLB with EVH from right thigh   Urethral stricture    recurrent   Wears dentures    UPPER   Wears glasses     Surgical History: Past Surgical History:  Procedure Laterality Date   CORONARY ARTERY BYPASS GRAFT N/A 08/17/2012   Procedure: CORONARY ARTERY BYPASS GRAFTING (CABG);  Surgeon: Loreli Slot, MD;  Location: [facility] OR;  Service: Open Heart Surgery;  Laterality: N/A;  L-LAD/D1, S-OM2,  S-PDA/PL   CYSTOSCOPY N/A 08/17/2012   Procedure: CYSTOSCOPY FLEXIBLE for difficult  foley catheter insertion;  Surgeon: Sebastian Ache, MD;  Location: [facility] OR;  Service: Urology;  Laterality: N/A;   CYSTOSCOPY N/A 01/16/2016   Procedure: CYSTOSCOPY FLEXIBLE;  Surgeon: Sebastian Ache, MD;  Location: [facility];  Service: Urology;  Laterality: N/A;   CYSTOSCOPY WITH LITHOLAPAXY N/A 10/31/2021   Procedure: CYSTOSCOPY WITH LITHOLAPAXY;  Surgeon: Malen Gauze, MD;  Location: [facility];  Service: Urology;  Laterality: N/A;   CYSTOSCOPY WITH URETHRAL DILATATION N/A 01/16/2016   Procedure: CYSTOSCOPY WITH URETHRAL BALLOON DILATATION;  Surgeon: Sebastian Ache, MD;  Location: [facility];  Service: Urology;  Laterality: N/A;   LEFT HEART CATHETERIZATION WITH CORONARY ANGIOGRAM N/A 08/16/2012   Procedure: LEFT HEART CATHETERIZATION WITH CORONARY ANGIOGRAM;  Surgeon: Tonny Bollman, MD;  Location: [facility] CATH LAB;  Service: Cardiovascular;  Laterality: N/A;  NSTEMI;  critical LM stenosis extending into the ostial LAD & LCFX,  moderately tight RCA stenosis,  Normal LVF, ef 55-60%   TRANSURETHRAL RESECTION OF PROSTATE  06/2012    Home Medications:  Allergies as of 06/26/2023   No Known Allergies      Medication List        Accurate as of June 26, 2023 12:44 PM. If you have any questions, ask your nurse or doctor.          amLODipine 10 MG tablet Commonly known as: NORVASC Take 10 mg by mouth every morning.  apixaban 5 MG Tabs tablet Commonly known as: ELIQUIS Take 5 mg by mouth 2 (two) times daily.   aspirin EC 81 MG tablet Take 1 tablet (81 mg total) by mouth daily.   atorvastatin 20 MG tablet Commonly known as: LIPITOR Take 20 mg by mouth every morning.   diclofenac Sodium 1 % Gel Commonly known as: VOLTAREN Apply 1 g topically 4 (four) times daily.   finasteride 5 MG tablet Commonly known as: PROSCAR Take 1 tablet (5 mg total) by mouth daily.   ibuprofen 600 MG tablet Commonly known as: ADVIL Take 600 mg by mouth every 8 (eight) hours as needed.   levothyroxine 75 MCG  tablet Commonly known as: SYNTHROID Take 75 mcg by mouth daily.   losartan 25 MG tablet Commonly known as: COZAAR Take 1 tablet by mouth daily.   metoprolol tartrate 25 MG tablet Commonly known as: LOPRESSOR Take 1 tablet (25 mg total) by mouth 2 (two) times daily.   omeprazole 20 MG capsule Commonly known as: PRILOSEC Take 20 mg by mouth every morning.   potassium chloride SA 20 MEQ tablet Commonly known as: KLOR-CON M Take 20 mEq by mouth daily.   tamsulosin 0.4 MG Caps capsule Commonly known as: FLOMAX Take 1 capsule (0.4 mg total) by mouth 2 (two) times daily.        Allergies: No Known Allergies  Family History: Family History  Problem Relation Age of Onset   Coronary artery disease Father 55    Social History:  reports that he quit smoking about 52 years ago. His smoking use included cigarettes. He started smoking about 62 years ago. He has a 5 pack-year smoking history. He has never used smokeless tobacco. He reports that he does not drink alcohol and does not use drugs.  ROS: All other review of systems were reviewed and are negative except what is noted above in HPI  Physical Exam: BP 107/67   Pulse 73   Constitutional:  Alert and oriented, No acute distress. HEENT: [place] AT, moist mucus membranes.  Trachea midline, no masses. Cardiovascular: No clubbing, cyanosis, or edema. Respiratory: Normal respiratory effort, no increased work of breathing. GI: Abdomen is soft, nontender, nondistended, no abdominal masses GU: No CVA tenderness.  Lymph: No cervical or inguinal lymphadenopathy. Skin: No rashes, bruises or suspicious lesions. Neurologic: Grossly intact, no focal deficits, moving all 4 extremities. Psychiatric: Normal mood and affect.  Laboratory Data: Lab Results  Component Value Date   WBC 12.6 (H) 08/08/2022   HGB 13.9 08/08/2022   HCT 41.8 08/08/2022   MCV 94.4 08/08/2022   PLT 239 08/08/2022    Lab Results  Component Value Date    CREATININE 1.48 (H) 08/08/2022    No results found for: "PSA"  No results found for: "TESTOSTERONE"  Lab Results  Component Value Date   HGBA1C 5.4 08/17/2012    Urinalysis    Component Value Date/Time   COLORURINE RED (A) 08/08/2022 1421   APPEARANCEUR Cloudy (A) 04/06/2023 1338   LABSPEC 1.006 08/08/2022 1421   PHURINE 6.0 08/08/2022 1421   GLUCOSEU Negative 04/06/2023 1338   HGBUR MODERATE (A) 08/08/2022 1421   BILIRUBINUR Negative 04/06/2023 1338   KETONESUR NEGATIVE 08/08/2022 1421   PROTEINUR 2+ (A) 04/06/2023 1338   PROTEINUR 100 (A) 08/08/2022 1421   UROBILINOGEN 1.0 08/17/2012 0408   NITRITE Positive (A) 04/06/2023 1338   NITRITE NEGATIVE 08/08/2022 1421   LEUKOCYTESUR 2+ (A) 04/06/2023 1338   LEUKOCYTESUR MODERATE (A) 08/08/2022 1421  Lab Results  Component Value Date   LABMICR See below: 04/06/2023   WBCUA 11-30 (A) 04/06/2023   LABEPIT 0-10 04/06/2023   MUCUS Present 10/09/2021   BACTERIA Many (A) 04/06/2023    Pertinent Imaging:  Results for orders placed during the hospital encounter of 08/25/12  DG Abd 1 View  Narrative *RADIOLOGY REPORT*  Clinical Data: Abdominal pain and vomiting.  Elevated white count.  ABDOMEN - 1 VIEW  Comparison: None  Findings: Mildly distended gas filled loops of small bowel are identified within the central abdomen. Stool and gas in the colon and rectum are noted. No suspicious calcifications are identified. Catheter overlying the pelvis is noted. Degenerative changes of the lower lumbar spine and hips noted. No acute bony abnormalities are present.  IMPRESSION: Nonspecific bowel gas pattern - small bowel obstruction is not excluded.  Consider further evaluation with CT as indicated.   Original Report Authenticated By: Harmon Pier, M.D.  No results found for this or any previous visit.  No results found for this or any previous visit.  No results found for this or any previous visit.  Results for  orders placed during the hospital encounter of 10/27/22  US RENAL  Narrative CLINICAL DATA:  Kidney stone follow-up.  EXAM: RENAL / URINARY TRACT ULTRASOUND COMPLETE  COMPARISON:  None Available.  FINDINGS: Right Kidney:  Renal measurements: 8.5 cm x 3.7 cm x 4.4 cm = volume: 73.2 mL. Diffusely increased echogenicity of the renal parenchyma is noted. No mass or hydronephrosis visualized.  Left Kidney:  Renal measurements: 8.9 cm x 5.6 cm x 3.9 cm = volume: 98.4 mL. Limited in visualization secondary to overlying bowel gas and rib shadows. Echogenicity within normal limits. No mass or hydronephrosis visualized.  Bladder:  Appears normal for degree of bladder distention. Bilateral ureteral jets are visualized.  Other:  None.  IMPRESSION: 1. Limited study, as described above, with subsequent Leigh limited visualization of the left kidney. 2. Echogenic right kidney which may represent sequelae associated with medical renal disease.   Electronically Signed By: Aram Candela M.D. On: 10/30/2022 21:30  No results found for this or any previous visit.  No results found for this or any previous visit.  No results found for this or any previous visit.   Assessment & Plan:    1. Benign prostatic hyperplasia with urinary obstruction (Primary) -contineu flomax 0.4mg  BID and finasteride - Urinalysis, Routine w reflex microscopic - BLADDER SCAN AMB NON-IMAGING  2.  Bladder stone resolved  3. Right inguinal hernia Referral to Dr. Lovell Sheehan  4. Acute cystitis -urine for culture Doxycycline 100mg  BID for 7 days  No follow-ups on file.  [name], MD  [facility] [place]

## 2023-06-28 LAB — URINE CULTURE

## 2023-06-30 ENCOUNTER — Encounter: Payer: Self-pay | Admitting: Urology

## 2023-06-30 NOTE — Patient Instructions (Signed)

## 2023-07-01 ENCOUNTER — Encounter: Payer: Self-pay | Admitting: Urology

## 2023-07-08 ENCOUNTER — Ambulatory Visit: Payer: Medicare HMO | Admitting: Internal Medicine

## 2023-07-10 ENCOUNTER — Encounter: Payer: Self-pay | Admitting: Internal Medicine

## 2023-07-10 ENCOUNTER — Ambulatory Visit: Payer: Medicare HMO | Attending: Internal Medicine | Admitting: Internal Medicine

## 2023-07-10 ENCOUNTER — Telehealth: Payer: Self-pay

## 2023-07-10 VITALS — BP 104/60 | HR 62 | Ht 71.0 in | Wt 136.8 lb

## 2023-07-10 DIAGNOSIS — I10 Essential (primary) hypertension: Secondary | ICD-10-CM

## 2023-07-10 DIAGNOSIS — I517 Cardiomegaly: Secondary | ICD-10-CM

## 2023-07-10 NOTE — Patient Instructions (Addendum)
 Medication Instructions:  Your physician has recommended you make the following change in your medication:   -Stop Aspirin  -Use compression stockings- can buy at your local pharmacy, [facility], [facility], [facility]   *If you need a refill on your cardiac medications before your next appointment, please call your pharmacy*   Lab Work: None If you have labs (blood work) drawn today and your tests are completely normal, you will receive your results only by: MyChart Message (if you have MyChart) OR A paper copy in the mail If you have any lab test that is abnormal or we need to change your treatment, we will call you to review the results.   Testing/Procedures: None   Follow-Up: At [facility], you and your health needs are our priority.  As part of our continuing mission to provide you with exceptional heart care, we have created designated Provider Care Teams.  These Care Teams include your primary Cardiologist (physician) and Advanced Practice Providers (APPs -  Physician Assistants and Nurse Practitioners) who all work together to provide you with the care you need, when you need it.  We recommend signing up for the patient portal called "MyChart".  Sign up information is provided on this After Visit Summary.  MyChart is used to connect with patients for Virtual Visits (Telemedicine).  Patients are able to view lab/test results, encounter notes, upcoming appointments, etc.  Non-urgent messages can be sent to your provider as well.   To learn more about what you can do with MyChart, go to [email or web address].    Your next appointment:   1 year(s)  Provider:   You may see Vishnu Norton Pastel, MD or the following Advanced Practice Provider on your designated Care Team:   Sharlene Dory, NP    Other Instructions

## 2023-07-10 NOTE — Progress Notes (Signed)
 Cardiology Office Note  Date: 07/10/2023   ID: Clayton Austin 1940/11/18, MRN [id number]  PCP:  Benetta Spar, MD  Cardiologist:  Marjo Bicker, MD Electrophysiologist:  None   History of Present Illness: Clayton Austin is a 83 y.o. male known to have CAD manifested by NSTEMI in 2014 s/p CABG (LIMA to LAD/D1, SVG to OM2, SVG to PDA/PLV), paroxysmal A-fib, severe LVH, chronic RBBB was referred to cardiology clinic to establish care.  Accompanied by wife.  Sedentary at home.  Can perform daily activities.  Needs assistance for some.  Denies having any symptoms of angina, DOE, dizziness, lightheadedness, palpitations, fatigue or leg swelling.  Overall doing great.  EKG today showed NSR.  He was seen by cardiology at [facility] in 2024 who recommended Lexiscan due to elevated troponins in setting of A-fib with RVR in 2024.  Lexiscan was not performed.  He was also recommended to obtain carotid Doppler ultrasound due to minimal carotid artery stenosis in 2018, this was not obtained.  Wife reported swelling in his bilateral legs but I did not notice any.  Past Medical History:  Diagnosis Date   Benign prostatic hypertrophy    BPH (benign prostatic hypertrophy)    Coronary artery disease cardiologist-  dr Purvis Sheffield   a. NSTEMI 07/2012 => critical LM disease on LHC => s/p CABG (L-LAD/D1, S-OM2, S-PDA/PL);  EF 55-60% at [facility] 07/2012   Exercise tolerance test normal    Dec 2014  per dr Purvis Sheffield (cardiologist) note [facility] treadmilll score 5.5 (low risk)   GERD (gastroesophageal reflux disease)    History of atrial fibrillation    post-op  CABG 04/ 2014   History of non-ST elevation myocardial infarction (NSTEMI)    04/ 2014   Hyperlipidemia    Hypertension    Hypothyroidism    Ischemic heart disease    STABLE PER DR Purvis Sheffield (CARDIOLOGIST) NOTE 06-18-2015   Lower urinary tract symptoms (LUTS)    RBBB (right bundle branch block)    S/P CABG x 5    08-17-2012   LIMA  sequentilly to LAD and Diagonal,  SVG to OM,  SVG seq to PDA and PLB with EVH from right thigh   Urethral stricture    recurrent   Wears dentures    UPPER   Wears glasses     Past Surgical History:  Procedure Laterality Date   CORONARY ARTERY BYPASS GRAFT N/A 08/17/2012   Procedure: CORONARY ARTERY BYPASS GRAFTING (CABG);  Surgeon: Loreli Slot, MD;  Location: [facility] OR;  Service: Open Heart Surgery;  Laterality: N/A;  L-LAD/D1, S-OM2,  S-PDA/PL   CYSTOSCOPY N/A 08/17/2012   Procedure: CYSTOSCOPY FLEXIBLE for difficult  foley catheter insertion;  Surgeon: Sebastian Ache, MD;  Location: [facility] OR;  Service: Urology;  Laterality: N/A;   CYSTOSCOPY N/A 01/16/2016   Procedure: CYSTOSCOPY FLEXIBLE;  Surgeon: Sebastian Ache, MD;  Location: [facility];  Service: Urology;  Laterality: N/A;   CYSTOSCOPY WITH LITHOLAPAXY N/A 10/31/2021   Procedure: CYSTOSCOPY WITH LITHOLAPAXY;  Surgeon: Malen Gauze, MD;  Location: [facility] ORS;  Service: Urology;  Laterality: N/A;   CYSTOSCOPY WITH URETHRAL DILATATION N/A 01/16/2016   Procedure: CYSTOSCOPY WITH URETHRAL BALLOON DILATATION;  Surgeon: Sebastian Ache, MD;  Location[facility];  Service: Urology;  Laterality: N/A;   LEFT HEART CATHETERIZATION WITH CORONARY ANGIOGRAM N/A 08/16/2012   Procedure: LEFT HEART CATHETERIZATION WITH CORONARY ANGIOGRAM;  Surgeon: Tonny Bollman, MD;  Location[facility] CATH LAB;  Service:  Cardiovascular;  Laterality: N/A;  NSTEMI;  critical LM stenosis extending into the ostial LAD & LCFX,  moderately tight RCA stenosis,  Normal LVF, ef 55-60%   TRANSURETHRAL RESECTION OF PROSTATE  06/2012    Current Outpatient Medications  Medication Sig Dispense Refill   amLODipine (NORVASC) 5 MG tablet Take 5 mg by mouth daily.     apixaban (ELIQUIS) 5 MG TABS tablet Take 5 mg by mouth 2 (two) times daily.     atorvastatin (LIPITOR) 20 MG tablet Take 20 mg by mouth every morning.      diclofenac Sodium (VOLTAREN) 1 %  GEL Apply 1 g topically 4 (four) times daily.     doxycycline (VIBRAMYCIN) 100 MG capsule Take 1 capsule (100 mg total) by mouth every 12 (twelve) hours. 14 capsule 0   finasteride (PROSCAR) 5 MG tablet Take 1 tablet (5 mg total) by mouth daily. 90 tablet 3   furosemide (LASIX) 40 MG tablet Take 40 mg by mouth daily.     ibuprofen (ADVIL) 600 MG tablet Take 600 mg by mouth every 8 (eight) hours as needed.     levothyroxine (SYNTHROID) 88 MCG tablet Take 88 mcg by mouth daily before breakfast.     losartan (COZAAR) 25 MG tablet Take 1 tablet by mouth daily.     metoprolol tartrate (LOPRESSOR) 25 MG tablet Take 1 tablet (25 mg total) by mouth 2 (two) times daily. 60 tablet 11   omeprazole (PRILOSEC) 20 MG capsule Take 20 mg by mouth every morning.      potassium chloride SA (KLOR-CON M) 20 MEQ tablet Take 20 mEq by mouth daily.     tamsulosin (FLOMAX) 0.4 MG CAPS capsule Take 1 capsule (0.4 mg total) by mouth 2 (two) times daily. 180 capsule 3   No current facility-administered medications for this visit.   Allergies:  Patient has no known allergies.   Social History: The patient  reports that he quit smoking about 52 years ago. His smoking use included cigarettes. He started smoking about 62 years ago. He has a 5 pack-year smoking history. He has never used smokeless tobacco. He reports that he does not drink alcohol and does not use drugs.   Family History: The patient's family history includes Coronary artery disease (age of onset: 20) in his father.   ROS:  Please see the history of present illness. Otherwise, complete review of systems is positive for none  All other systems are reviewed and negative.   Physical Exam: VS:  BP 104/60 (BP Location: Left Arm, Patient Position: Sitting, Cuff Size: Normal)   Pulse 62   Ht 5\' 11"  (1.803 m)   Wt 136 lb 12.8 oz (62.1 kg)   SpO2 97%   BMI 19.08 kg/m , BMI Body mass index is 19.08 kg/m.  Wt Readings from Last 3 Encounters:  07/10/23 136 lb  12.8 oz (62.1 kg)  04/06/23 151 lb (68.5 kg)  08/08/22 151 lb (68.5 kg)    General: Patient appears comfortable at rest. HEENT: Conjunctiva and lids normal, oropharynx clear with moist mucosa. Neck: Supple, no elevated JVP or carotid bruits, no thyromegaly. Lungs: Clear to auscultation, nonlabored breathing at rest. Cardiac: Regular rate and rhythm, no S3 or significant systolic murmur, no pericardial rub. Abdomen: Soft, nontender, no hepatomegaly, bowel sounds present, no guarding or rebound. Extremities: No pitting edema, distal pulses 2+. Skin: Warm and dry. Musculoskeletal: No kyphosis. Neuropsychiatric: Alert and oriented x3, affect grossly appropriate.  Recent Labwork: 08/08/2022: BUN 25; Creatinine, Ser  1.48; Hemoglobin 13.9; Platelets 239; Potassium 3.8; Sodium 136  No results found for: "CHOL", "TRIG", "HDL", "CHOLHDL", "VLDL", "LDLCALC", "LDLDIRECT"    Assessment and Plan:  CAD manifested by NSTEMI 2014 s/p CABG: No angina or DOE in the last 1 year.  Not on aspirin due to Eliquis use.  Continue atorvastatin 20 mg nightly.  ER precautions for chest pain.  HLD, not at goal: LDL 105 in 2024.  Will obtain lipid panel report from the PCP.  Goal LDL will need to be less than 70.  Currently on atorvastatin 20 mg nightly.  Paroxysmal A-fib: EKG today showed NSR.  Asymptomatic.  Continue metoprolol tartrate 25 mg twice daily and Eliquis 5 mg twice daily.  Severe LVH on echocardiogram from 2024: Asymptomatic.  Will defer any workup at this time.  HTN, controlled: Continue current antihypertensives, amlodipine 5 mg once daily, p.o. Lasix 40 mg once daily and losartan 25 mg once daily.  Currently does not have any dizziness or lightheadedness with soft blood pressures.  Instructed patient that he probably will need to cut back on the antihypertensive medications in the future if he develops new symptoms of dizziness.  Minimal bilateral carotid artery disease in 2018: Will repeat.        Medication Adjustments/Labs and Tests Ordered: Current medicines are reviewed at length with the patient today.  Concerns regarding medicines are outlined above.    Disposition:  Follow up  1 year  Signed Tauren Delbuono Verne Spurr, MD, 07/10/2023 12:46 PM    [facility] [address] [place], [place], [place] [address]

## 2023-07-10 NOTE — Telephone Encounter (Signed)
 Pt wife called wqanting the up her appt w/ [facility] advised her to call that office and gave her the number [phone]

## 2023-07-14 ENCOUNTER — Telehealth: Payer: Self-pay

## 2023-07-14 DIAGNOSIS — I779 Disorder of arteries and arterioles, unspecified: Secondary | ICD-10-CM

## 2023-07-14 DIAGNOSIS — I517 Cardiomegaly: Secondary | ICD-10-CM | POA: Insufficient documentation

## 2023-07-14 NOTE — Telephone Encounter (Signed)
-----   Message from Clayton Austin sent at 07/14/2023 10:17 AM EDT ----- Regarding: Carotid doppler and HLD report I saw this patient last week Friday. Could you obtain carotid Doppler ultrasound bilateral (diagnosis: Carotid artery disease) and lipid panel report from his PCP.  Thank you.

## 2023-07-14 NOTE — Telephone Encounter (Signed)
 Patient informed and verbalized understanding. Will send to scheduling for time and date

## 2023-07-23 ENCOUNTER — Encounter: Payer: Self-pay | Admitting: General Surgery

## 2023-07-23 ENCOUNTER — Ambulatory Visit (INDEPENDENT_AMBULATORY_CARE_PROVIDER_SITE_OTHER): Admitting: General Surgery

## 2023-07-23 VITALS — BP 108/66 | HR 82 | Temp 97.5°F | Resp 12 | Ht 71.0 in | Wt 145.0 lb

## 2023-07-23 DIAGNOSIS — K4090 Unilateral inguinal hernia, without obstruction or gangrene, not specified as recurrent: Secondary | ICD-10-CM | POA: Diagnosis not present

## 2023-07-23 NOTE — Progress Notes (Signed)
 Clayton Austin; [id number]; 05-28-1940   HPI Patient is an 83 year old black male who was referred to my care by Dr. Ronne Binning of urology for evaluation and treatment of a right inguinal hernia.  History was obtained primarily through the patient's wife who takes care of him.  He has multiple medical issues and primarily leads a sedentary life.  He primarily sits in a wheelchair but is able to ambulate, though this is limited secondary to hip pain.  She states that she has noted a right groin mass which goes away when he lies down.  He does not complain of pain in that area.  No nausea or vomiting have been noted.  Chronically anticoagulated for atrial fibrillation Past Medical History:  Diagnosis Date   Benign prostatic hypertrophy    BPH (benign prostatic hypertrophy)    Coronary artery disease cardiologist-  dr Purvis Sheffield   a. NSTEMI 07/2012 => critical LM disease on LHC => s/p CABG (L-LAD/D1, S-OM2, S-PDA/PL);  EF 55-60% at [facility] 07/2012   Exercise tolerance test normal    Dec 2014  per dr Purvis Sheffield (cardiologist) note [facility] treadmilll score 5.5 (low risk)   GERD (gastroesophageal reflux disease)    History of atrial fibrillation    post-op  CABG 04/ 2014   History of non-ST elevation myocardial infarction (NSTEMI)    04/ 2014   Hyperlipidemia    Hypertension    Hypothyroidism    Ischemic heart disease    STABLE PER DR Purvis Sheffield (CARDIOLOGIST) NOTE 06-18-2015   Lower urinary tract symptoms (LUTS)    RBBB (right bundle branch block)    S/P CABG x 5    08-17-2012   LIMA sequentilly to LAD and Diagonal,  SVG to OM,  SVG seq to PDA and PLB with EVH from right thigh   Urethral stricture    recurrent   Wears dentures    UPPER   Wears glasses     Past Surgical History:  Procedure Laterality Date   CORONARY ARTERY BYPASS GRAFT N/A 08/17/2012   Procedure: CORONARY ARTERY BYPASS GRAFTING (CABG);  Surgeon: Loreli Slot, MD;  Location: [facility] OR;  Service: Open Heart Surgery;  Laterality:  N/A;  L-LAD/D1, S-OM2,  S-PDA/PL   CYSTOSCOPY N/A 08/17/2012   Procedure: CYSTOSCOPY FLEXIBLE for difficult  foley catheter insertion;  Surgeon: Sebastian Ache, MD;  Location: [facility] OR;  Service: Urology;  Laterality: N/A;   CYSTOSCOPY N/A 01/16/2016   Procedure: CYSTOSCOPY FLEXIBLE;  Surgeon: Sebastian Ache, MD;  Location: [facility];  Service: Urology;  Laterality: N/A;   CYSTOSCOPY WITH LITHOLAPAXY N/A 10/31/2021   Procedure: CYSTOSCOPY WITH LITHOLAPAXY;  Surgeon: Malen Gauze, MD;  Location: [facility];  Service: Urology;  Laterality: N/A;   CYSTOSCOPY WITH URETHRAL DILATATION N/A 01/16/2016   Procedure: CYSTOSCOPY WITH URETHRAL BALLOON DILATATION;  Surgeon: Sebastian Ache, MD;  Location: [facility];  Service: Urology;  Laterality: N/A;   LEFT HEART CATHETERIZATION WITH CORONARY ANGIOGRAM N/A 08/16/2012   Procedure: LEFT HEART CATHETERIZATION WITH CORONARY ANGIOGRAM;  Surgeon: Tonny Bollman, MD;  Location: [facility] CATH LAB;  Service: Cardiovascular;  Laterality: N/A;  NSTEMI;  critical LM stenosis extending into the ostial LAD & LCFX,  moderately tight RCA stenosis,  Normal LVF, ef 55-60%   TRANSURETHRAL RESECTION OF PROSTATE  06/2012    Family History  Problem Relation Age of Onset   Coronary artery disease Father 19    Current Outpatient Medications on File Prior to Visit  Medication Sig Dispense Refill  amLODipine (NORVASC) 5 MG tablet Take 5 mg by mouth daily.     apixaban (ELIQUIS) 5 MG TABS tablet Take 5 mg by mouth 2 (two) times daily.     atorvastatin (LIPITOR) 20 MG tablet Take 20 mg by mouth every morning.      diclofenac Sodium (VOLTAREN) 1 % GEL Apply 1 g topically 4 (four) times daily.     doxycycline (VIBRAMYCIN) 100 MG capsule Take 1 capsule (100 mg total) by mouth every 12 (twelve) hours. 14 capsule 0   finasteride (PROSCAR) 5 MG tablet Take 1 tablet (5 mg total) by mouth daily. 90 tablet 3   furosemide (LASIX) 40 MG tablet Take 40 mg by mouth  daily.     ibuprofen (ADVIL) 600 MG tablet Take 600 mg by mouth every 8 (eight) hours as needed.     levothyroxine (SYNTHROID) 88 MCG tablet Take 88 mcg by mouth daily before breakfast.     losartan (COZAAR) 25 MG tablet Take 1 tablet by mouth daily.     metoprolol tartrate (LOPRESSOR) 25 MG tablet Take 1 tablet (25 mg total) by mouth 2 (two) times daily. 60 tablet 11   omeprazole (PRILOSEC) 20 MG capsule Take 20 mg by mouth every morning.      potassium chloride SA (KLOR-CON M) 20 MEQ tablet Take 20 mEq by mouth daily.     tamsulosin (FLOMAX) 0.4 MG CAPS capsule Take 1 capsule (0.4 mg total) by mouth 2 (two) times daily. 180 capsule 3   No current facility-administered medications on file prior to visit.    No Known Allergies  Social History   Substance and Sexual Activity  Alcohol Use No   Alcohol/week: 0.0 standard drinks of alcohol    Social History   Tobacco Use  Smoking Status Former   Current packs/day: 0.00   Average packs/day: 0.5 packs/day for 10.0 years (5.0 ttl pk-yrs)   Types: Cigarettes   Start date: 08/05/1960   Quit date: 08/06/1970   Years since quitting: 52.9  Smokeless Tobacco Never    Review of Systems  Constitutional:  Positive for chills.  HENT: Negative.    Eyes: Negative.   Respiratory: Negative.    Cardiovascular: Negative.   Gastrointestinal: Negative.   Genitourinary:  Positive for dysuria.  Musculoskeletal:  Positive for joint pain.  Skin: Negative.   Neurological: Negative.   Endo/Heme/Allergies: Negative.   Psychiatric/Behavioral: Negative.      Objective   Vitals:   07/23/23 1101  BP: 108/66  Pulse: 82  Resp: 12  Temp: (!) 97.5 F (36.4 C)  SpO2: 97%    Physical Exam Vitals reviewed. Nursing note reviewed: Patient examined in wheelchair. Constitutional:      Appearance: Normal appearance. He is normal weight. He is not ill-appearing.  HENT:     Head: Normocephalic and atraumatic.  Cardiovascular:     Rate and Rhythm:  Normal rate. Rhythm irregular.     Heart sounds: Normal heart sounds. No murmur heard.    No friction rub. No gallop.  Pulmonary:     Effort: Pulmonary effort is normal. No respiratory distress.     Breath sounds: Normal breath sounds. No stridor. No wheezing, rhonchi or rales.  Abdominal:     General: There is no distension.     Palpations: Abdomen is soft. There is no mass.     Tenderness: There is no abdominal tenderness. There is no guarding or rebound.     Hernia: A hernia is present.  Comments: Easily reducible right inguinal hernia.  No left inguinal hernia noted.  Genitourinary:    Testes: Normal.  Skin:    General: Skin is warm and dry.  Neurological:     Mental Status: He is alert and oriented to person, place, and time.   Multiple office notes including cardiology notes reviewed  Assessment  Right inguinal hernia, currently asymptomatic.  Patient also leads a sedentary lifestyle.  He has primarily taken care of by his wife. Multiple comorbidities including cardiac disease, dementia, hypertension Plan  At this point, I do not recommend surgical intervention.  Wife and patient agree.  I think he would have a difficult time tolerating the surgery.  He would need cardiac clearance prior to any surgical intervention.  As he leads a sedentary life, I doubt hernia repair would provide any benefit.  His risk of incarceration is low.  The signs and symptoms of incarceration were explained to the patient's wife.  Follow-up here as needed should he become more symptomatic.

## 2023-07-30 DIAGNOSIS — N390 Urinary tract infection, site not specified: Secondary | ICD-10-CM | POA: Diagnosis not present

## 2023-07-30 DIAGNOSIS — I10 Essential (primary) hypertension: Secondary | ICD-10-CM | POA: Diagnosis not present

## 2023-07-30 DIAGNOSIS — Z0001 Encounter for general adult medical examination with abnormal findings: Secondary | ICD-10-CM | POA: Diagnosis not present

## 2023-07-30 DIAGNOSIS — I5032 Chronic diastolic (congestive) heart failure: Secondary | ICD-10-CM | POA: Diagnosis not present

## 2023-07-30 DIAGNOSIS — R600 Localized edema: Secondary | ICD-10-CM | POA: Diagnosis not present

## 2023-07-30 DIAGNOSIS — I480 Paroxysmal atrial fibrillation: Secondary | ICD-10-CM | POA: Diagnosis not present

## 2023-07-30 DIAGNOSIS — I2510 Atherosclerotic heart disease of native coronary artery without angina pectoris: Secondary | ICD-10-CM | POA: Diagnosis not present

## 2023-07-30 DIAGNOSIS — E785 Hyperlipidemia, unspecified: Secondary | ICD-10-CM | POA: Diagnosis not present

## 2023-07-30 DIAGNOSIS — Z1389 Encounter for screening for other disorder: Secondary | ICD-10-CM | POA: Diagnosis not present

## 2023-07-31 DIAGNOSIS — E039 Hypothyroidism, unspecified: Secondary | ICD-10-CM | POA: Diagnosis not present

## 2023-07-31 DIAGNOSIS — T83511D Infection and inflammatory reaction due to indwelling urethral catheter, subsequent encounter: Secondary | ICD-10-CM | POA: Diagnosis not present

## 2023-07-31 DIAGNOSIS — I119 Hypertensive heart disease without heart failure: Secondary | ICD-10-CM | POA: Diagnosis not present

## 2023-07-31 DIAGNOSIS — N390 Urinary tract infection, site not specified: Secondary | ICD-10-CM | POA: Diagnosis not present

## 2023-08-03 ENCOUNTER — Encounter

## 2023-08-13 ENCOUNTER — Telehealth: Payer: Self-pay

## 2023-08-13 NOTE — Telephone Encounter (Signed)
 Patient's wife called in to voided that patient is seeing blood in his urine. Wife state's patient said he been seeing blood in his urine off and on for awhile now. Patient scheduled with NP and placed on cancellation list for a sooner appointment. Verbalized understanding.

## 2023-08-29 DIAGNOSIS — I2510 Atherosclerotic heart disease of native coronary artery without angina pectoris: Secondary | ICD-10-CM | POA: Diagnosis not present

## 2023-08-29 DIAGNOSIS — I10 Essential (primary) hypertension: Secondary | ICD-10-CM | POA: Diagnosis not present

## 2023-08-30 DIAGNOSIS — Z7982 Long term (current) use of aspirin: Secondary | ICD-10-CM | POA: Diagnosis not present

## 2023-08-30 DIAGNOSIS — E079 Disorder of thyroid, unspecified: Secondary | ICD-10-CM | POA: Diagnosis not present

## 2023-08-30 DIAGNOSIS — Z79899 Other long term (current) drug therapy: Secondary | ICD-10-CM | POA: Diagnosis not present

## 2023-08-30 DIAGNOSIS — K5900 Constipation, unspecified: Secondary | ICD-10-CM | POA: Diagnosis not present

## 2023-08-30 DIAGNOSIS — E785 Hyperlipidemia, unspecified: Secondary | ICD-10-CM | POA: Diagnosis not present

## 2023-08-30 DIAGNOSIS — I10 Essential (primary) hypertension: Secondary | ICD-10-CM | POA: Diagnosis not present

## 2023-09-17 ENCOUNTER — Ambulatory Visit

## 2023-09-23 NOTE — Progress Notes (Deleted)
 Name: Clayton Austin DOB: 1940/07/15 MRN: [id number]  History of Present Illness: Clayton Austin is a 83 y.o. male who presents today for follow up visit at [facility] [place].  Relevant History includes: 1. BPH with LUTS (weak urinary stream, hesitancy, urgency, frequency, nocturia, straining to void, occasional urge incontinence). - 08/17/2012: Underwent TURP procedure by Dr. Secundino Dach. - 08/02/2022: Episode of urinary retention secondary to constipation. Treated with enemas and Foley catheter in ER.  - Denies caffeine intake. 2. Urethral stricture (recurrent). - 10/31/2021: Underwent urethral dilation by Dr. Claretta Croft. 3. Kidney / bladder stones. - 10/31/2021: Underwent cystolithalopaxy by Dr. Claretta Croft.  4. 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 5. Erectile dysfunction.   Urine culture results in past 12 months: - 01/05/2023: Positive for Pseudomonas aeruginosa - 04/06/2023: Grew lactobacillus - 06/26/2023: Negative  At last visit with Dr. Claretta Croft on 06/26/2023: The plan was: 1. For acute UTI: Doxycycline  prescribed. 2. For BPH: Continue Flomax  BID and Proscar . 3. For right inguinal hernia: Referred to General Surgery.   Since last visit: > 08/30/2023: Seen in ER for constipation x3 days. Received soap suds enema.  Today: He {Actions; denies-reports:120008} urinary urgency, frequency, nocturia x***, dysuria, gross hematuria, ***weak urinary stream, hesitancy, straining to void, or sensations of incomplete emptying.   Medications: Current Outpatient Medications  Medication Sig Dispense Refill   amLODipine  (NORVASC ) 5 MG tablet Take 5 mg by mouth daily.     apixaban (ELIQUIS) 5 MG TABS tablet Take 5 mg by mouth 2 (two) times daily.     atorvastatin  (LIPITOR ) 20 MG tablet Take 20 mg by mouth every morning.      diclofenac Sodium (VOLTAREN) 1 % GEL Apply 1 g topically 4 (four) times daily.     doxycycline  (VIBRAMYCIN ) 100 MG capsule Take 1 capsule (100 mg total) by  mouth every 12 (twelve) hours. 14 capsule 0   finasteride  (PROSCAR ) 5 MG tablet Take 1 tablet (5 mg total) by mouth daily. 90 tablet 3   furosemide  (LASIX ) 40 MG tablet Take 40 mg by mouth daily.     ibuprofen (ADVIL) 600 MG tablet Take 600 mg by mouth every 8 (eight) hours as needed.     levothyroxine  (SYNTHROID ) 88 MCG tablet Take 88 mcg by mouth daily before breakfast.     losartan  (COZAAR ) 25 MG tablet Take 1 tablet by mouth daily.     metoprolol  tartrate (LOPRESSOR ) 25 MG tablet Take 1 tablet (25 mg total) by mouth 2 (two) times daily. 60 tablet 11   omeprazole (PRILOSEC) 20 MG capsule Take 20 mg by mouth every morning.      potassium chloride  SA (KLOR-CON  M) 20 MEQ tablet Take 20 mEq by mouth daily.     tamsulosin  (FLOMAX ) 0.4 MG CAPS capsule Take 1 capsule (0.4 mg total) by mouth 2 (two) times daily. 180 capsule 3   No current facility-administered medications for this visit.    Allergies: No Known Allergies  Past Medical History:  Diagnosis Date   Benign prostatic hypertrophy    BPH (benign prostatic hypertrophy)    Coronary artery disease cardiologist-  dr Wanetta Guthrie   a. NSTEMI 07/2012 => critical LM disease on LHC => s/p CABG (L-LAD/D1, S-OM2, S-PDA/PL);  EF 55-60% at [facility] 07/2012   Exercise tolerance test normal    Dec 2014  per dr Wanetta Guthrie (cardiologist) note [facility] treadmilll score 5.5 (low risk)   GERD (gastroesophageal reflux disease)    History of atrial fibrillation    post-op  CABG 04/  2014   History of non-ST elevation myocardial infarction (NSTEMI)    04/ 2014   Hyperlipidemia    Hypertension    Hypothyroidism    Ischemic heart disease    STABLE PER DR Wanetta Guthrie (CARDIOLOGIST) NOTE 06-18-2015   Lower urinary tract symptoms (LUTS)    RBBB (right bundle branch block)    S/P CABG x 5    08-17-2012   LIMA sequentilly to LAD and Diagonal,  SVG to OM,  SVG seq to PDA and PLB with EVH from right thigh   Urethral stricture    recurrent   Wears dentures    UPPER    Wears glasses    Past Surgical History:  Procedure Laterality Date   CORONARY ARTERY BYPASS GRAFT N/A 08/17/2012   Procedure: CORONARY ARTERY BYPASS GRAFTING (CABG);  Surgeon: Zelphia Higashi, MD;  Location: [facility] OR;  Service: Open Heart Surgery;  Laterality: N/A;  L-LAD/D1, S-OM2,  S-PDA/PL   CYSTOSCOPY N/A 08/17/2012   Procedure: CYSTOSCOPY FLEXIBLE for difficult  foley catheter insertion;  Surgeon: Osborn Blaze, MD;  Location: [facility] OR;  Service: Urology;  Laterality: N/A;   CYSTOSCOPY N/A 01/16/2016   Procedure: CYSTOSCOPY FLEXIBLE;  Surgeon: Osborn Blaze, MD;  Location: [facility];  Service: Urology;  Laterality: N/A;   CYSTOSCOPY WITH LITHOLAPAXY N/A 10/31/2021   Procedure: CYSTOSCOPY WITH LITHOLAPAXY;  Surgeon: Marco Severs, MD;  Location: [facility];  Service: Urology;  Laterality: N/A;   CYSTOSCOPY WITH URETHRAL DILATATION N/A 01/16/2016   Procedure: CYSTOSCOPY WITH URETHRAL BALLOON DILATATION;  Surgeon: Osborn Blaze, MD;  Location: [facility];  Service: Urology;  Laterality: N/A;   LEFT HEART CATHETERIZATION WITH CORONARY ANGIOGRAM N/A 08/16/2012   Procedure: LEFT HEART CATHETERIZATION WITH CORONARY ANGIOGRAM;  Surgeon: Arnoldo Lapping, MD;  Location: [facility] CATH LAB;  Service: Cardiovascular;  Laterality: N/A;  NSTEMI;  critical LM stenosis extending into the ostial LAD & LCFX,  moderately tight RCA stenosis,  Normal LVF, ef 55-60%   TRANSURETHRAL RESECTION OF PROSTATE  06/2012   Family History  Problem Relation Age of Onset   Coronary artery disease Father 12   Social History   Socioeconomic History   Marital status: Married    Spouse name: Not on file   Number of children: Not on file   Years of education: Not on file   Highest education level: Not on file  Occupational History   Not on file  Tobacco Use   Smoking status: Former    Current packs/day: 0.00    Average packs/day: 0.5 packs/day for 10.0 years (5.0 ttl pk-yrs)    Types:  Cigarettes    Start date: 08/05/1960    Quit date: 08/06/1970    Years since quitting: 53.1   Smokeless tobacco: Never  Vaping Use   Vaping status: Never Used  Substance and Sexual Activity   Alcohol  use: No    Alcohol /week: 0.0 standard drinks of alcohol    Drug use: No   Sexual activity: Not Currently  Other Topics Concern   Not on file  Social History Narrative   Not on file   Social Drivers of Health   Financial Resource Strain: Low Risk  (08/28/2022)   Received from [facility], [facility]   Overall Financial Resource Strain (CARDIA)    Difficulty of Paying Living Expenses: Not very hard  Food Insecurity: No Food Insecurity (08/28/2022)   Received from [facility], [facility]   Hunger Vital Sign    Worried About Running Out  of Food in the Last Year: Never true    Ran Out of Food in the Last Year: Never true  Transportation Needs: No Transportation Needs (08/28/2022)   Received from [facility], [facility]   PRAPARE - Transportation    Lack of Transportation (Medical): No    Lack of Transportation (Non-Medical): No  Physical Activity: Not on file  Stress: No Stress Concern Present (08/03/2022)   Received from [facility], [facility] of Occupational Health - Occupational Stress Questionnaire    Feeling of Stress : Not at all  Social Connections: Unknown (08/03/2022)   Received from [facility], [facility]   Social Network    Social Network: Not on file  Intimate Partner Violence: Not At Risk (08/03/2022)   Received from [facility], [facility]   HITS    Over the last 12 months how often did your partner physically hurt you?: Never    Over the last 12 months how often did your partner insult you or talk down to you?: Never    Over the last 12 months how often did your partner threaten you with physical harm?: Never    Over the last 12 months how often did your partner scream or curse at you?: Never    Review of  Systems Constitutional: Patient denies any unintentional weight loss or change in strength lntegumentary: Patient denies any rashes or pruritus Cardiovascular: Patient denies chest pain or syncope Respiratory: Patient denies shortness of breath Gastrointestinal: ***Patient denies nausea, vomiting, constipation, or diarrhea ***As per HPI Musculoskeletal: Patient denies muscle cramps or weakness Neurologic: Patient denies convulsions or seizures Allergic/Immunologic: Patient denies recent allergic reaction(s) Hematologic/Lymphatic: Patient denies bleeding tendencies Endocrine: Patient denies heat/cold intolerance  GU: As per HPI.  OBJECTIVE There were no vitals filed for this visit. There is no height or weight on file to calculate BMI.  Physical Examination Constitutional: No obvious distress; patient is non-toxic appearing  Cardiovascular: No visible lower extremity edema.  Respiratory: The patient does not have audible wheezing/stridor; respirations do not appear labored  Gastrointestinal: Abdomen non-distended Musculoskeletal: Normal ROM of UEs  Skin: No obvious rashes/open sores  Neurologic: CN 2-12 grossly intact Psychiatric: Answered questions appropriately with normal affect  Hematologic/Lymphatic/Immunologic: No obvious bruises or sites of spontaneous bleeding  UA: ***negative ***positive for *** leukocytes, *** blood, ***nitrites Urine microscopy: *** WBC/hpf, *** RBC/hpf, *** bacteria ***glucosuria (secondary to ***Jardiance ***Farxiga use) ***otherwise unremarkable  PVR: *** ml  ASSESSMENT No diagnosis found. ***  We agreed to plan for follow up in *** months / ***1 year or sooner if needed. Patient verbalized understanding of and agreement with current plan. All questions were answered.  PLAN Advised the following: 1. *** 2. ***No follow-ups on file.  No orders of the defined types were placed in this encounter.   It has been explained that the patient is  to follow regularly with their PCP in addition to all other providers involved in their care and to follow instructions provided by these respective offices. Patient advised to contact urology clinic if any urologic-pertaining questions, concerns, new symptoms or problems arise in the interim period.  There are no Patient Instructions on file for this visit.  Electronically signed by:  Lauretta Ponto, FNP   09/23/23    1:16 PM

## 2023-09-24 ENCOUNTER — Ambulatory Visit: Admitting: Urology

## 2023-09-24 DIAGNOSIS — N401 Enlarged prostate with lower urinary tract symptoms: Secondary | ICD-10-CM

## 2023-09-24 DIAGNOSIS — N99114 Postprocedural urethral stricture, male, unspecified: Secondary | ICD-10-CM

## 2023-09-24 DIAGNOSIS — N521 Erectile dysfunction due to diseases classified elsewhere: Secondary | ICD-10-CM

## 2023-09-24 DIAGNOSIS — Z87440 Personal history of urinary (tract) infections: Secondary | ICD-10-CM

## 2023-09-24 DIAGNOSIS — N200 Calculus of kidney: Secondary | ICD-10-CM

## 2023-09-29 ENCOUNTER — Ambulatory Visit: Admitting: Urology

## 2023-09-29 DIAGNOSIS — I2510 Atherosclerotic heart disease of native coronary artery without angina pectoris: Secondary | ICD-10-CM | POA: Diagnosis not present

## 2023-09-29 DIAGNOSIS — I10 Essential (primary) hypertension: Secondary | ICD-10-CM | POA: Diagnosis not present

## 2023-10-06 ENCOUNTER — Ambulatory Visit: Admitting: Urology

## 2023-10-09 ENCOUNTER — Ambulatory Visit (HOSPITAL_COMMUNITY)
Admission: RE | Admit: 2023-10-09 | Discharge: 2023-10-09 | Disposition: A | Discharge status: Home / Self Care | Source: Ambulatory Visit | Attending: Urology | Admitting: Urology

## 2023-10-09 DIAGNOSIS — K4090 Unilateral inguinal hernia, without obstruction or gangrene, not specified as recurrent: Secondary | ICD-10-CM | POA: Diagnosis not present

## 2023-10-09 DIAGNOSIS — R1909 Other intra-abdominal and pelvic swelling, mass and lump: Secondary | ICD-10-CM | POA: Diagnosis not present

## 2023-10-15 ENCOUNTER — Encounter

## 2023-10-19 ENCOUNTER — Ambulatory Visit: Payer: Self-pay | Admitting: Urology

## 2023-10-19 NOTE — Progress Notes (Signed)
 Please let pt know his ultrasound confirmed he has a right inguinal hernia. Does he need a referral to General Surgery for that?

## 2023-10-19 NOTE — Telephone Encounter (Signed)
 Patient/wife is made aware. Wife Clayton Austin state's they already knew about the inguinal hernia and his PCP advised not to do surgery due to his age. Patient/wife state they will call back after making a decision on general surgery referral.

## 2023-10-19 NOTE — Telephone Encounter (Signed)
-----   Message from Lauraine JAYSON Oz sent at 10/19/2023 12:22 PM EDT ----- Please let pt know his ultrasound confirmed he has a right inguinal hernia. Does he need a referral to General Surgery for that? ----- Message ----- From: Interface, Rad Results In Sent: 10/19/2023  10:15 AM EDT To: Sarah C Larocco, FNP

## 2023-11-04 DIAGNOSIS — I10 Essential (primary) hypertension: Secondary | ICD-10-CM | POA: Diagnosis not present

## 2023-11-04 DIAGNOSIS — I2510 Atherosclerotic heart disease of native coronary artery without angina pectoris: Secondary | ICD-10-CM | POA: Diagnosis not present

## 2023-11-10 ENCOUNTER — Telehealth: Payer: Self-pay | Admitting: Internal Medicine

## 2023-11-10 NOTE — Telephone Encounter (Signed)
 Pt cancelled Carotid 3xs, per staff message from Dr. Stacia Can follow up with PCP and return back to cardiology if any new issues,.  Deleting pt's recall.

## 2023-12-05 DIAGNOSIS — I2510 Atherosclerotic heart disease of native coronary artery without angina pectoris: Secondary | ICD-10-CM | POA: Diagnosis not present

## 2023-12-05 DIAGNOSIS — I10 Essential (primary) hypertension: Secondary | ICD-10-CM | POA: Diagnosis not present

## 2023-12-15 DIAGNOSIS — K4091 Unilateral inguinal hernia, without obstruction or gangrene, recurrent: Secondary | ICD-10-CM | POA: Diagnosis not present

## 2023-12-15 DIAGNOSIS — K4090 Unilateral inguinal hernia, without obstruction or gangrene, not specified as recurrent: Secondary | ICD-10-CM | POA: Diagnosis not present

## 2023-12-15 DIAGNOSIS — Z79899 Other long term (current) drug therapy: Secondary | ICD-10-CM | POA: Diagnosis not present

## 2023-12-15 DIAGNOSIS — Z7982 Long term (current) use of aspirin: Secondary | ICD-10-CM | POA: Diagnosis not present

## 2023-12-15 DIAGNOSIS — N3001 Acute cystitis with hematuria: Secondary | ICD-10-CM | POA: Diagnosis not present

## 2023-12-15 DIAGNOSIS — E785 Hyperlipidemia, unspecified: Secondary | ICD-10-CM | POA: Diagnosis not present

## 2023-12-15 DIAGNOSIS — R1084 Generalized abdominal pain: Secondary | ICD-10-CM | POA: Diagnosis not present

## 2023-12-15 DIAGNOSIS — E079 Disorder of thyroid, unspecified: Secondary | ICD-10-CM | POA: Diagnosis not present

## 2023-12-15 DIAGNOSIS — I10 Essential (primary) hypertension: Secondary | ICD-10-CM | POA: Diagnosis not present

## 2023-12-17 ENCOUNTER — Ambulatory Visit: Admitting: Urology

## 2023-12-25 ENCOUNTER — Emergency Department (HOSPITAL_COMMUNITY)

## 2023-12-25 ENCOUNTER — Encounter (HOSPITAL_COMMUNITY): Payer: Self-pay | Admitting: Family Medicine

## 2023-12-25 ENCOUNTER — Inpatient Hospital Stay (HOSPITAL_COMMUNITY)
Admission: EM | Admit: 2023-12-25 | Discharge: 2023-12-31 | DRG: 872 | Disposition: A | Discharge status: Skilled Nursing Facility | Attending: Family Medicine | Admitting: Family Medicine

## 2023-12-25 ENCOUNTER — Other Ambulatory Visit: Payer: Self-pay

## 2023-12-25 DIAGNOSIS — Z681 Body mass index (BMI) 19 or less, adult: Secondary | ICD-10-CM

## 2023-12-25 DIAGNOSIS — R220 Localized swelling, mass and lump, head: Secondary | ICD-10-CM | POA: Diagnosis not present

## 2023-12-25 DIAGNOSIS — I480 Paroxysmal atrial fibrillation: Secondary | ICD-10-CM | POA: Diagnosis not present

## 2023-12-25 DIAGNOSIS — I959 Hypotension, unspecified: Secondary | ICD-10-CM | POA: Diagnosis not present

## 2023-12-25 DIAGNOSIS — K8000 Calculus of gallbladder with acute cholecystitis without obstruction: Secondary | ICD-10-CM | POA: Diagnosis not present

## 2023-12-25 DIAGNOSIS — I252 Old myocardial infarction: Secondary | ICD-10-CM

## 2023-12-25 DIAGNOSIS — D649 Anemia, unspecified: Secondary | ICD-10-CM | POA: Diagnosis not present

## 2023-12-25 DIAGNOSIS — R6520 Severe sepsis without septic shock: Secondary | ICD-10-CM | POA: Diagnosis present

## 2023-12-25 DIAGNOSIS — Z66 Do not resuscitate: Secondary | ICD-10-CM | POA: Diagnosis present

## 2023-12-25 DIAGNOSIS — R319 Hematuria, unspecified: Secondary | ICD-10-CM

## 2023-12-25 DIAGNOSIS — I2510 Atherosclerotic heart disease of native coronary artery without angina pectoris: Secondary | ICD-10-CM | POA: Diagnosis not present

## 2023-12-25 DIAGNOSIS — A419 Sepsis, unspecified organism: Principal | ICD-10-CM | POA: Diagnosis present

## 2023-12-25 DIAGNOSIS — R2689 Other abnormalities of gait and mobility: Secondary | ICD-10-CM | POA: Diagnosis not present

## 2023-12-25 DIAGNOSIS — Z79890 Hormone replacement therapy (postmenopausal): Secondary | ICD-10-CM

## 2023-12-25 DIAGNOSIS — I25810 Atherosclerosis of coronary artery bypass graft(s) without angina pectoris: Secondary | ICD-10-CM | POA: Diagnosis not present

## 2023-12-25 DIAGNOSIS — N3001 Acute cystitis with hematuria: Secondary | ICD-10-CM | POA: Diagnosis present

## 2023-12-25 DIAGNOSIS — N202 Calculus of kidney with calculus of ureter: Secondary | ICD-10-CM | POA: Diagnosis not present

## 2023-12-25 DIAGNOSIS — I6782 Cerebral ischemia: Secondary | ICD-10-CM | POA: Diagnosis not present

## 2023-12-25 DIAGNOSIS — K219 Gastro-esophageal reflux disease without esophagitis: Secondary | ICD-10-CM | POA: Diagnosis not present

## 2023-12-25 DIAGNOSIS — Z043 Encounter for examination and observation following other accident: Secondary | ICD-10-CM | POA: Diagnosis not present

## 2023-12-25 DIAGNOSIS — N3289 Other specified disorders of bladder: Secondary | ICD-10-CM | POA: Diagnosis not present

## 2023-12-25 DIAGNOSIS — N361 Urethral diverticulum: Secondary | ICD-10-CM | POA: Diagnosis not present

## 2023-12-25 DIAGNOSIS — E039 Hypothyroidism, unspecified: Secondary | ICD-10-CM | POA: Diagnosis present

## 2023-12-25 DIAGNOSIS — E785 Hyperlipidemia, unspecified: Secondary | ICD-10-CM | POA: Diagnosis present

## 2023-12-25 DIAGNOSIS — E8809 Other disorders of plasma-protein metabolism, not elsewhere classified: Secondary | ICD-10-CM | POA: Diagnosis present

## 2023-12-25 DIAGNOSIS — R300 Dysuria: Secondary | ICD-10-CM | POA: Diagnosis not present

## 2023-12-25 DIAGNOSIS — Z8249 Family history of ischemic heart disease and other diseases of the circulatory system: Secondary | ICD-10-CM

## 2023-12-25 DIAGNOSIS — R1310 Dysphagia, unspecified: Secondary | ICD-10-CM | POA: Diagnosis not present

## 2023-12-25 DIAGNOSIS — H353131 Nonexudative age-related macular degeneration, bilateral, early dry stage: Secondary | ICD-10-CM | POA: Diagnosis not present

## 2023-12-25 DIAGNOSIS — Z7901 Long term (current) use of anticoagulants: Secondary | ICD-10-CM

## 2023-12-25 DIAGNOSIS — R3912 Poor urinary stream: Secondary | ICD-10-CM | POA: Diagnosis present

## 2023-12-25 DIAGNOSIS — N210 Calculus in bladder: Secondary | ICD-10-CM | POA: Diagnosis present

## 2023-12-25 DIAGNOSIS — E441 Mild protein-calorie malnutrition: Secondary | ICD-10-CM | POA: Diagnosis not present

## 2023-12-25 DIAGNOSIS — Z79899 Other long term (current) drug therapy: Secondary | ICD-10-CM | POA: Diagnosis not present

## 2023-12-25 DIAGNOSIS — I700 Atherosclerosis of aorta: Secondary | ICD-10-CM | POA: Diagnosis not present

## 2023-12-25 DIAGNOSIS — R531 Weakness: Secondary | ICD-10-CM | POA: Diagnosis not present

## 2023-12-25 DIAGNOSIS — Z7189 Other specified counseling: Secondary | ICD-10-CM | POA: Diagnosis not present

## 2023-12-25 DIAGNOSIS — I4891 Unspecified atrial fibrillation: Secondary | ICD-10-CM | POA: Diagnosis present

## 2023-12-25 DIAGNOSIS — N401 Enlarged prostate with lower urinary tract symptoms: Secondary | ICD-10-CM | POA: Diagnosis present

## 2023-12-25 DIAGNOSIS — N179 Acute kidney failure, unspecified: Secondary | ICD-10-CM | POA: Diagnosis present

## 2023-12-25 DIAGNOSIS — E871 Hypo-osmolality and hyponatremia: Secondary | ICD-10-CM | POA: Diagnosis not present

## 2023-12-25 DIAGNOSIS — R911 Solitary pulmonary nodule: Secondary | ICD-10-CM | POA: Diagnosis not present

## 2023-12-25 DIAGNOSIS — N136 Pyonephrosis: Secondary | ICD-10-CM | POA: Diagnosis not present

## 2023-12-25 DIAGNOSIS — R278 Other lack of coordination: Secondary | ICD-10-CM | POA: Diagnosis not present

## 2023-12-25 DIAGNOSIS — M47812 Spondylosis without myelopathy or radiculopathy, cervical region: Secondary | ICD-10-CM | POA: Diagnosis not present

## 2023-12-25 DIAGNOSIS — N200 Calculus of kidney: Secondary | ICD-10-CM | POA: Diagnosis not present

## 2023-12-25 DIAGNOSIS — Z87891 Personal history of nicotine dependence: Secondary | ICD-10-CM

## 2023-12-25 DIAGNOSIS — I517 Cardiomegaly: Secondary | ICD-10-CM | POA: Diagnosis not present

## 2023-12-25 DIAGNOSIS — Z515 Encounter for palliative care: Secondary | ICD-10-CM | POA: Diagnosis not present

## 2023-12-25 DIAGNOSIS — I4510 Unspecified right bundle-branch block: Secondary | ICD-10-CM | POA: Diagnosis present

## 2023-12-25 DIAGNOSIS — N139 Obstructive and reflux uropathy, unspecified: Secondary | ICD-10-CM | POA: Diagnosis not present

## 2023-12-25 DIAGNOSIS — E440 Moderate protein-calorie malnutrition: Secondary | ICD-10-CM | POA: Diagnosis present

## 2023-12-25 DIAGNOSIS — M4803 Spinal stenosis, cervicothoracic region: Secondary | ICD-10-CM | POA: Diagnosis not present

## 2023-12-25 DIAGNOSIS — D62 Acute posthemorrhagic anemia: Secondary | ICD-10-CM | POA: Diagnosis not present

## 2023-12-25 DIAGNOSIS — I10 Essential (primary) hypertension: Secondary | ICD-10-CM | POA: Diagnosis present

## 2023-12-25 DIAGNOSIS — G9389 Other specified disorders of brain: Secondary | ICD-10-CM | POA: Diagnosis not present

## 2023-12-25 DIAGNOSIS — R310 Gross hematuria: Secondary | ICD-10-CM | POA: Diagnosis not present

## 2023-12-25 DIAGNOSIS — K4090 Unilateral inguinal hernia, without obstruction or gangrene, not specified as recurrent: Secondary | ICD-10-CM | POA: Diagnosis not present

## 2023-12-25 DIAGNOSIS — M6289 Other specified disorders of muscle: Secondary | ICD-10-CM | POA: Diagnosis not present

## 2023-12-25 DIAGNOSIS — N390 Urinary tract infection, site not specified: Secondary | ICD-10-CM | POA: Diagnosis not present

## 2023-12-25 DIAGNOSIS — R488 Other symbolic dysfunctions: Secondary | ICD-10-CM | POA: Diagnosis not present

## 2023-12-25 DIAGNOSIS — R680 Hypothermia, not associated with low environmental temperature: Secondary | ICD-10-CM | POA: Diagnosis not present

## 2023-12-25 DIAGNOSIS — N1330 Unspecified hydronephrosis: Secondary | ICD-10-CM | POA: Diagnosis not present

## 2023-12-25 DIAGNOSIS — R109 Unspecified abdominal pain: Secondary | ICD-10-CM | POA: Diagnosis not present

## 2023-12-25 DIAGNOSIS — M6281 Muscle weakness (generalized): Secondary | ICD-10-CM | POA: Diagnosis not present

## 2023-12-25 DIAGNOSIS — N020 Recurrent and persistent hematuria with minor glomerular abnormality: Secondary | ICD-10-CM | POA: Diagnosis not present

## 2023-12-25 DIAGNOSIS — M4802 Spinal stenosis, cervical region: Secondary | ICD-10-CM | POA: Diagnosis not present

## 2023-12-25 LAB — URINALYSIS, ROUTINE W REFLEX MICROSCOPIC
Bacteria, UA: NONE SEEN
Bilirubin Urine: NEGATIVE
Glucose, UA: NEGATIVE mg/dL
Ketones, ur: NEGATIVE mg/dL
Leukocytes,Ua: NEGATIVE
Nitrite: NEGATIVE
Protein, ur: 100 mg/dL — AB
RBC / HPF: >50 RBC/hpf (ref 0–5)
Specific Gravity, Urine: 1.016 (ref 1.005–1.030)
WBC, UA: >50 WBC/hpf (ref 0–5)
pH: 6 (ref 5.0–8.0)

## 2023-12-25 LAB — COMPREHENSIVE METABOLIC PANEL WITH GFR
ALT: 16 U/L (ref 0–44)
AST: 36 U/L (ref 15–41)
Albumin: 3.4 g/dL — ABNORMAL LOW (ref 3.5–5.0)
Alkaline Phosphatase: 61 U/L (ref 38–126)
Anion gap: 17 — ABNORMAL HIGH (ref 5–15)
BUN: 48 mg/dL — ABNORMAL HIGH (ref 8–23)
CO2: 17 mmol/L — ABNORMAL LOW (ref 22–32)
Calcium: 9 mg/dL (ref 8.9–10.3)
Chloride: 98 mmol/L (ref 98–111)
Creatinine, Ser: 2.82 mg/dL — ABNORMAL HIGH (ref 0.61–1.24)
GFR, Estimated: 22 mL/min — ABNORMAL LOW (ref >60)
Glucose, Bld: 113 mg/dL — ABNORMAL HIGH (ref 70–99)
Potassium: 4.8 mmol/L (ref 3.5–5.1)
Sodium: 132 mmol/L — ABNORMAL LOW (ref 135–145)
Total Bilirubin: 0.9 mg/dL (ref 0.0–1.2)
Total Protein: 6.7 g/dL (ref 6.5–8.1)

## 2023-12-25 LAB — CBC WITH DIFFERENTIAL/PLATELET
Abs Immature Granulocytes: 0.04 K/uL (ref 0.00–0.07)
Basophils Absolute: 0 K/uL (ref 0.0–0.1)
Basophils Relative: 0 %
Eosinophils Absolute: 0 K/uL (ref 0.0–0.5)
Eosinophils Relative: 0 %
HCT: 19.9 % — ABNORMAL LOW (ref 39.0–52.0)
Hemoglobin: 6.1 g/dL — CL (ref 13.0–17.0)
Immature Granulocytes: 0 %
Lymphocytes Relative: 11 %
Lymphs Abs: 1.3 K/uL (ref 0.7–4.0)
MCH: 30.5 pg (ref 26.0–34.0)
MCHC: 30.7 g/dL (ref 30.0–36.0)
MCV: 99.5 fL (ref 80.0–100.0)
Monocytes Absolute: 1.1 K/uL — ABNORMAL HIGH (ref 0.1–1.0)
Monocytes Relative: 9 %
Neutro Abs: 9.6 K/uL — ABNORMAL HIGH (ref 1.7–7.7)
Neutrophils Relative %: 80 %
Platelets: 268 K/uL (ref 150–400)
RBC: 2 MIL/uL — ABNORMAL LOW (ref 4.22–5.81)
RDW: 13.4 % (ref 11.5–15.5)
WBC: 12 K/uL — ABNORMAL HIGH (ref 4.0–10.5)
nRBC: 0 % (ref 0.0–0.2)

## 2023-12-25 LAB — MAGNESIUM: Magnesium: 2.1 mg/dL (ref 1.7–2.4)

## 2023-12-25 LAB — LACTIC ACID, PLASMA
Lactic Acid, Venous: 4.7 mmol/L (ref 0.5–1.9)
Lactic Acid, Venous: 4 mmol/L (ref 0.5–1.9)
Lactic Acid, Venous: 2.3 mmol/L (ref 0.5–1.9)

## 2023-12-25 LAB — BASIC METABOLIC PANEL WITH GFR
Anion gap: 14 (ref 5–15)
BUN: 43 mg/dL — ABNORMAL HIGH (ref 8–23)
CO2: 17 mmol/L — ABNORMAL LOW (ref 22–32)
Calcium: 8.5 mg/dL — ABNORMAL LOW (ref 8.9–10.3)
Chloride: 100 mmol/L (ref 98–111)
Creatinine, Ser: 2.4 mg/dL — ABNORMAL HIGH (ref 0.61–1.24)
GFR, Estimated: 26 mL/min — ABNORMAL LOW (ref >60)
Glucose, Bld: 100 mg/dL — ABNORMAL HIGH (ref 70–99)
Potassium: 4.3 mmol/L (ref 3.5–5.1)
Sodium: 131 mmol/L — ABNORMAL LOW (ref 135–145)

## 2023-12-25 LAB — TROPONIN I (HIGH SENSITIVITY)
Troponin I (High Sensitivity): 87 ng/L — ABNORMAL HIGH (ref <18)
Troponin I (High Sensitivity): 96 ng/L — ABNORMAL HIGH (ref <18)

## 2023-12-25 LAB — POC OCCULT BLOOD, ED: Negative: NEGATIVE

## 2023-12-25 LAB — PREPARE RBC (CROSSMATCH)

## 2023-12-25 MED ORDER — ONDANSETRON HCL 4 MG/2ML IJ SOLN
4.0000 mg | Freq: Four times a day (QID) | INTRAMUSCULAR | Status: AC | PRN
Start: 2023-12-25 — End: ?

## 2023-12-25 MED ORDER — TETANUS-DIPHTH-ACELL PERTUSSIS 5-2.5-18.5 LF-MCG/0.5 IM SUSY: 0.5000 mL | PREFILLED_SYRINGE | Freq: Once | INTRAMUSCULAR | Status: DC

## 2023-12-25 MED ORDER — FENTANYL CITRATE (PF) 100 MCG/2ML IJ SOLN
50.0000 ug | Freq: Once | INTRAMUSCULAR | Status: AC
  Administered 2023-12-25: 50 ug via INTRAVENOUS
  Filled 2023-12-25: qty 2

## 2023-12-25 MED ORDER — TAMSULOSIN HCL 0.4 MG PO CAPS
0.4000 mg | ORAL_CAPSULE | Freq: Two times a day (BID) | ORAL | Status: DC
  Administered 2023-12-25 – 2023-12-31 (×12): 0.4 mg via ORAL
  Filled 2023-12-25 (×12): qty 1

## 2023-12-25 MED ORDER — ACETAMINOPHEN 650 MG RE SUPP: 650.0000 mg | Freq: Four times a day (QID) | RECTAL | Status: DC | PRN

## 2023-12-25 MED ORDER — METOPROLOL TARTRATE 25 MG PO TABS
25.0000 mg | ORAL_TABLET | Freq: Two times a day (BID) | ORAL | Status: DC
  Administered 2023-12-26: 25 mg via ORAL
  Filled 2023-12-25 (×3): qty 1

## 2023-12-25 MED ORDER — LACTATED RINGERS IV BOLUS
1000.0000 mL | Freq: Once | INTRAVENOUS | Status: AC
  Administered 2023-12-25: 1000 mL via INTRAVENOUS

## 2023-12-25 MED ORDER — SODIUM CHLORIDE 0.9 % IV SOLN
1.0000 g | Freq: Once | INTRAVENOUS | Status: AC
  Administered 2023-12-25: 1 g via INTRAVENOUS
  Filled 2023-12-25: qty 10

## 2023-12-25 MED ORDER — ONDANSETRON HCL 4 MG PO TABS: 4.0000 mg | ORAL_TABLET | Freq: Four times a day (QID) | ORAL | Status: DC | PRN

## 2023-12-25 MED ORDER — LEVOTHYROXINE SODIUM 100 MCG PO TABS
100.0000 ug | ORAL_TABLET | Freq: Every day | ORAL | Status: DC
  Administered 2023-12-26 – 2023-12-31 (×6): 100 ug via ORAL
  Filled 2023-12-25 (×6): qty 1

## 2023-12-25 MED ORDER — ATORVASTATIN CALCIUM 20 MG PO TABS
20.0000 mg | ORAL_TABLET | Freq: Every morning | ORAL | Status: DC
  Administered 2023-12-26 – 2023-12-31 (×6): 20 mg via ORAL
  Filled 2023-12-25: qty 1
  Filled 2023-12-25 (×2): qty 2
  Filled 2023-12-25 (×3): qty 1

## 2023-12-25 MED ORDER — ACETAMINOPHEN 325 MG PO TABS
650.0000 mg | ORAL_TABLET | Freq: Four times a day (QID) | ORAL | Status: DC | PRN
  Administered 2023-12-25 – 2023-12-26 (×3): 650 mg via ORAL
  Filled 2023-12-25 (×3): qty 2

## 2023-12-25 MED ORDER — SODIUM CHLORIDE 0.9 % IV SOLN
1.0000 g | INTRAVENOUS | Status: DC
  Administered 2023-12-26 – 2023-12-28 (×3): 1 g via INTRAVENOUS
  Filled 2023-12-25 (×3): qty 10

## 2023-12-25 MED ORDER — SODIUM CHLORIDE 0.9 % IV BOLUS
500.0000 mL | Freq: Once | INTRAVENOUS | Status: AC
  Administered 2023-12-25: 500 mL via INTRAVENOUS

## 2023-12-25 MED ORDER — PANTOPRAZOLE SODIUM 40 MG PO TBEC
40.0000 mg | DELAYED_RELEASE_TABLET | Freq: Every day | ORAL | Status: DC
  Administered 2023-12-25 – 2023-12-31 (×7): 40 mg via ORAL
  Filled 2023-12-25 (×7): qty 1

## 2023-12-25 MED ORDER — FINASTERIDE 5 MG PO TABS
5.0000 mg | ORAL_TABLET | Freq: Every day | ORAL | Status: DC
  Administered 2023-12-25 – 2023-12-31 (×7): 5 mg via ORAL
  Filled 2023-12-25 (×7): qty 1

## 2023-12-25 MED ORDER — SODIUM CHLORIDE 0.9% IV SOLUTION: Freq: Once | INTRAVENOUS | Status: DC

## 2023-12-25 MED ORDER — LACTATED RINGERS IV SOLN: INTRAVENOUS | Status: AC

## 2023-12-25 MED ORDER — ENSURE PLUS HIGH PROTEIN PO LIQD
237.0000 mL | Freq: Two times a day (BID) | ORAL | Status: DC
  Administered 2023-12-26 – 2023-12-29 (×3): 237 mL via ORAL

## 2023-12-25 MED ORDER — ONDANSETRON HCL 4 MG/2ML IJ SOLN
4.0000 mg | Freq: Once | INTRAMUSCULAR | Status: AC
  Administered 2023-12-25: 4 mg via INTRAVENOUS
  Filled 2023-12-25: qty 2

## 2023-12-25 MED ORDER — MORPHINE SULFATE (PF) 4 MG/ML IV SOLN
2.0000 mg | Freq: Once | INTRAVENOUS | Status: AC
  Administered 2023-12-25: 2 mg via INTRAVENOUS
  Filled 2023-12-25: qty 1

## 2023-12-25 MED ORDER — LACTATED RINGERS IV BOLUS
500.0000 mL | Freq: Once | INTRAVENOUS | Status: AC
  Administered 2023-12-25 (×2): 500 mL via INTRAVENOUS

## 2023-12-25 NOTE — ED Notes (Addendum)
 Wife gave verbal consent over the phone for blood product, spoke with Rosina PARAS- EMT P and Rock CROME, RN

## 2023-12-25 NOTE — H&P (Signed)
 History and Physical    Patient: Clayton Austin FMW:[id number] DOB: 24-Apr-1940 DOA: 12/25/2023 DOS: the patient was seen and examined on 12/25/2023 PCP: Carlette Benita Area, MD  Patient coming from: Home  Chief Complaint:  Chief Complaint  Patient presents with   Fall   HPI: Clayton Austin is a 83 y.o. male with medical history significant of hypertension, hypothyroidism, hyperlipidemia, coronary artery disease with history of CABG x 5 vessels in and NSTEMI in 2014, paroxysmal atrial fibrillation on anticoagulation.  Patient has been having difficulty urinating for the past 3 to 4 days.  Around the same time, he has noticed some hematuria.  Per his recollection, this has never happened before. He came to the hospital he came to the hospital due to suprapubic pain and difficulty urinating.  No fevers or chills although last night he got weak and fell, striking his head. No confusion or neurodeficits following that episode.  Due to inability to urinate, urology came and placed a Foley catheter.  Per EDP 7 ureteral stones were present which were pushed back into the bladder.  [name] was noted.  Review of Systems: As mentioned in the history of present illness. All other systems reviewed and are negative. Past Medical History:  Diagnosis Date   Benign prostatic hypertrophy    BPH (benign prostatic hypertrophy)    Coronary artery disease cardiologist-  dr charls   a. NSTEMI 07/2012 => critical LM disease on LHC => s/p CABG (L-LAD/D1, S-OM2, S-PDA/PL);  EF 55-60% at [facility] 07/2012   Exercise tolerance test normal    Dec 2014  per dr charls (cardiologist) note [facility] treadmilll score 5.5 (low risk)   GERD (gastroesophageal reflux disease)    History of atrial fibrillation    post-op  CABG 04/ 2014   History of non-ST elevation myocardial infarction (NSTEMI)    04/ 2014   Hyperlipidemia    Hypertension    Hypothyroidism    Ischemic heart disease    STABLE PER DR CHARLS  (CARDIOLOGIST) NOTE 06-18-2015   Lower urinary tract symptoms (LUTS)    RBBB (right bundle branch block)    S/P CABG x 5    08-17-2012   LIMA sequentilly to LAD and Diagonal,  SVG to OM,  SVG seq to PDA and PLB with EVH from right thigh   Urethral stricture    recurrent   Wears dentures    UPPER   Wears glasses    Past Surgical History:  Procedure Laterality Date   CORONARY ARTERY BYPASS GRAFT N/A 08/17/2012   Procedure: CORONARY ARTERY BYPASS GRAFTING (CABG);  Surgeon: Elspeth JAYSON Millers, MD;  Location: [facility] OR;  Service: Open Heart Surgery;  Laterality: N/A;  L-LAD/D1, S-OM2,  S-PDA/PL   CYSTOSCOPY N/A 08/17/2012   Procedure: CYSTOSCOPY FLEXIBLE for difficult  foley catheter insertion;  Surgeon: Ricardo Likens, MD;  Location: [facility] OR;  Service: Urology;  Laterality: N/A;   CYSTOSCOPY N/A 01/16/2016   Procedure: CYSTOSCOPY FLEXIBLE;  Surgeon: Ricardo Likens, MD;  Location: [facility];  Service: Urology;  Laterality: N/A;   CYSTOSCOPY WITH LITHOLAPAXY N/A 10/31/2021   Procedure: CYSTOSCOPY WITH LITHOLAPAXY;  Surgeon: Sherrilee Belvie CROME, MD;  Location: [facility];  Service: Urology;  Laterality: N/A;   CYSTOSCOPY WITH URETHRAL DILATATION N/A 01/16/2016   Procedure: CYSTOSCOPY WITH URETHRAL BALLOON DILATATION;  Surgeon: Ricardo Likens, MD;  Location: [facility];  Service: Urology;  Laterality: N/A;   LEFT HEART CATHETERIZATION WITH CORONARY ANGIOGRAM N/A 08/16/2012   Procedure:  LEFT HEART CATHETERIZATION WITH CORONARY ANGIOGRAM;  Surgeon: Ozell Fell, MD;  Location: [facility] CATH LAB;  Service: Cardiovascular;  Laterality: N/A;  NSTEMI;  critical LM stenosis extending into the ostial LAD & LCFX,  moderately tight RCA stenosis,  Normal LVF, ef 55-60%   TRANSURETHRAL RESECTION OF PROSTATE  06/2012   Social History:  reports that he quit smoking about 53 years ago. His smoking use included cigarettes. He started smoking about 63 years ago. He has a 5 pack-year smoking history.  He has never used smokeless tobacco. He reports that he does not drink alcohol  and does not use drugs.  No Known Allergies  Family History  Problem Relation Age of Onset   Coronary artery disease Father 59    Prior to Admission medications   Medication Sig Start Date End Date Taking? Authorizing Provider  amLODipine  (NORVASC ) 5 MG tablet Take 5 mg by mouth daily.    [provider]  apixaban (ELIQUIS) 5 MG TABS tablet Take 5 mg by mouth 2 (two) times daily. 08/07/22   [provider]  atorvastatin  (LIPITOR ) 20 MG tablet Take 20 mg by mouth every morning.     [provider]  diclofenac Sodium (VOLTAREN) 1 % GEL Apply 1 g topically 4 (four) times daily. 07/15/22   [provider]  doxycycline  (VIBRAMYCIN ) 100 MG capsule Take 1 capsule (100 mg total) by mouth every 12 (twelve) hours. 06/26/23   McKenzie, Belvie CROME, MD  finasteride  (PROSCAR ) 5 MG tablet Take 1 tablet (5 mg total) by mouth daily. 06/26/23   McKenzie, Belvie CROME, MD  furosemide  (LASIX ) 40 MG tablet Take 40 mg by mouth daily.    [provider]  ibuprofen (ADVIL) 600 MG tablet Take 600 mg by mouth every 8 (eight) hours as needed.    [provider]  levothyroxine  (SYNTHROID ) 88 MCG tablet Take 88 mcg by mouth daily before breakfast.    [provider]  losartan  (COZAAR ) 25 MG tablet Take 1 tablet by mouth daily. 04/08/17   [provider]  metoprolol  tartrate (LOPRESSOR ) 25 MG tablet Take 1 tablet (25 mg total) by mouth 2 (two) times daily. 04/08/17   Charls Pearla LABOR, MD  omeprazole (PRILOSEC) 20 MG capsule Take 20 mg by mouth every morning.     [provider]  potassium chloride  SA (KLOR-CON  M) 20 MEQ tablet Take 20 mEq by mouth daily.    [provider]  tamsulosin  (FLOMAX ) 0.4 MG CAPS capsule Take 1 capsule (0.4 mg total) by mouth 2 (two) times daily. 06/26/23   Sherrilee Belvie CROME, MD    Physical Exam: Vitals:   12/25/23 1800 12/25/23  1815 12/25/23 1830 12/25/23 1915  BP: (!) 116/53 (!) 120/59 130/64 (!) 134/54  Pulse: 73 77 79 81  Resp: 14  18 20   Temp:      TempSrc:      SpO2: 100% 100% 100% 100%  Weight:      Height:       General: Elderly male. Awake and alert and oriented x3. No acute cardiopulmonary distress.  HEENT: Normocephalic atraumatic.  Right and left ears normal in appearance.  Pupils equal, round, reactive to light. Extraocular muscles are intact. Sclerae anicteric and noninjected.  Moist mucosal membranes. No mucosal lesions.  Neck: Neck supple without lymphadenopathy. No carotid bruits. No masses palpated.  Cardiovascular: Regular rate with normal S1-S2 sounds. No murmurs, rubs, gallops auscultated. No JVD.  Respiratory: Good respiratory effort with no wheezes, rales, rhonchi. Lungs clear  to auscultation bilaterally.  No accessory muscle use. Abdomen: Soft, tenderness in the suprapubic area, nondistended. Active bowel sounds. No masses or hepatosplenomegaly  Skin: No rashes, lesions, or ulcerations.  Dry, warm to touch. 2+ dorsalis pedis and radial pulses. Musculoskeletal: No calf or leg pain. All major joints not erythematous nontender.  No upper or lower joint deformation.  Good ROM.  No contractures  Psychiatric: Intact judgment and insight. Pleasant and cooperative. Neurologic: No focal neurological deficits. Strength is 5/5 and symmetric in upper and lower extremities.  Cranial nerves II through XII are grossly intact.  Data Reviewed: Labs and imaging reviewed by me  Assessment and Plan: No notes have been filed under this hospital service. Service: Hospitalist  Active Problems:   CAD (coronary artery disease) of artery bypass graft   Hypothyroidism   Benign prostatic hyperplasia with weak urinary stream   Benign essential HTN   Bladder stone   Paroxysmal atrial fibrillation (HCC)   Atrial fibrillation with RVR (HCC)   Sepsis (HCC)  Sepsis Admit Antibiotics Repeat lactic acid every 2  hours Fluid bolus Hematuria with UTI IV antibiotics Anemia secondary to acute blood loss Hold anticoagulation Transfuse 1 unit Repeat CBC AKI secondary to ureteral obstruction IV fluids Recheck creatinine Paroxysmal atrial fibrillation Hold anticoagulation Continue rate control with metoprolol  BPH Continue Flomax  and finasteride  Hypertension Hypothyroidism   Advance Care Planning:   Code Status: Full Code confirmed by patient  Consults: Urology  Family Communication: None  Severity of Illness: The appropriate patient status for this patient is INPATIENT. Inpatient status is judged to be reasonable and necessary in order to provide the required intensity of service to ensure the patient's safety. The patient's presenting symptoms, physical exam findings, and initial radiographic and laboratory data in the context of their chronic comorbidities is felt to place them at high risk for further clinical deterioration. Furthermore, it is not anticipated that the patient will be medically stable for discharge from the hospital within 2 midnights of admission.   * I certify that at the point of admission it is my clinical judgment that the patient will require inpatient hospital care spanning beyond 2 midnights from the point of admission due to high intensity of service, high risk for further deterioration and high frequency of surveillance required.*  Author: Aleighya Mcanelly J Baelynn Schmuhl, DO 12/25/2023 7:28 PM  For on call review [email or web address].

## 2023-12-25 NOTE — ED Provider Notes (Signed)
 [place] EMERGENCY DEPARTMENT AT [facility] Provider Note   CSN: [id number] Arrival date & time: 12/25/23  1238     Patient presents with: Clayton Austin is a 83 y.o. male.   Patient is an 83 year old male who presents to the emergency department with his wife secondary to generalized weakness, recent fall with head injury.  Patient also admits to suprapubic abdominal discomfort and difficulty with urination.  Patient currently denies any associated fever or chills.  Wife notes that he had a fall last night after his legs became weak and he fell to the ground.  There was no associated dizziness, lightheadedness or syncope.  Patient denies any associated chest pain or shortness of breath at this point.  He has had no associated nausea, vomiting or diarrhea.  He denies any melena or hematochezia.   Fall Associated symptoms include abdominal pain and headaches.       Prior to Admission medications   Medication Sig Start Date End Date Taking? Authorizing Provider  amLODipine  (NORVASC ) 5 MG tablet Take 5 mg by mouth daily.    [provider]  apixaban (ELIQUIS) 5 MG TABS tablet Take 5 mg by mouth 2 (two) times daily. 08/07/22   [provider]  atorvastatin  (LIPITOR ) 20 MG tablet Take 20 mg by mouth every morning.     [provider]  diclofenac Sodium (VOLTAREN) 1 % GEL Apply 1 g topically 4 (four) times daily. 07/15/22   [provider]  doxycycline  (VIBRAMYCIN ) 100 MG capsule Take 1 capsule (100 mg total) by mouth every 12 (twelve) hours. 06/26/23   McKenzie, Belvie CROME, MD  finasteride  (PROSCAR ) 5 MG tablet Take 1 tablet (5 mg total) by mouth daily. 06/26/23   McKenzie, Belvie CROME, MD  furosemide  (LASIX ) 40 MG tablet Take 40 mg by mouth daily.    [provider]  ibuprofen (ADVIL) 600 MG tablet Take 600 mg by mouth every 8 (eight) hours as needed.    [provider]  levothyroxine  (SYNTHROID ) 88 MCG tablet Take 88 mcg  by mouth daily before breakfast.    [provider]  losartan  (COZAAR ) 25 MG tablet Take 1 tablet by mouth daily. 04/08/17   [provider]  metoprolol  tartrate (LOPRESSOR ) 25 MG tablet Take 1 tablet (25 mg total) by mouth 2 (two) times daily. 04/08/17   Charls Pearla LABOR, MD  omeprazole (PRILOSEC) 20 MG capsule Take 20 mg by mouth every morning.     [provider]  potassium chloride  SA (KLOR-CON  M) 20 MEQ tablet Take 20 mEq by mouth daily.    [provider]  tamsulosin  (FLOMAX ) 0.4 MG CAPS capsule Take 1 capsule (0.4 mg total) by mouth 2 (two) times daily. 06/26/23   McKenzie, Belvie CROME, MD    Allergies: Patient has no known allergies.    Review of Systems  Gastrointestinal:  Positive for abdominal pain.  Neurological:  Positive for headaches.  All other systems reviewed and are negative.   Updated Vital Signs BP 130/69   Pulse 91   Temp (!) 97.5 F (36.4 C) (Oral)   Resp (!) 22   Ht 6' 1 (1.854 m)   Wt 68 kg   SpO2 100%   BMI 19.79 kg/m   Physical Exam Vitals and nursing note reviewed.  Constitutional:      General: He is not in acute distress.    Appearance: Normal appearance. He is not ill-appearing.  HENT:     Head:  Normocephalic and atraumatic.     Nose: Nose normal.     Mouth/Throat:     Mouth: Mucous membranes are moist.  Eyes:     Extraocular Movements: Extraocular movements intact.     Conjunctiva/sclera: Conjunctivae normal.     Pupils: Pupils are equal, round, and reactive to light.  Cardiovascular:     Rate and Rhythm: Normal rate and regular rhythm.     Pulses: Normal pulses.     Heart sounds: Normal heart sounds. No murmur heard.    No gallop.  Pulmonary:     Effort: Pulmonary effort is normal. No respiratory distress.     Breath sounds: Normal breath sounds. No stridor. No wheezing, rhonchi or rales.  Abdominal:     General: Abdomen is flat. Bowel sounds are normal. There is no distension.     Palpations:  Abdomen is soft.     Tenderness: There is no guarding.     Comments: Tenderness palpation to suprapubic region  Musculoskeletal:        General: No swelling, tenderness, deformity or signs of injury. Normal range of motion.     Cervical back: Normal range of motion and neck supple. No rigidity or tenderness.     Right lower leg: No edema.     Left lower leg: No edema.  Skin:    General: Skin is warm and dry.     Comments: Abrasion to the left posterior aspect of the scalp  Neurological:     General: No focal deficit present.     Mental Status: He is alert and oriented to person, place, and time. Mental status is at baseline.     Cranial Nerves: No cranial nerve deficit.     Sensory: No sensory deficit.     Coordination: Coordination normal.     Gait: Gait normal.  Psychiatric:        Mood and Affect: Mood normal.        Behavior: Behavior normal.        Thought Content: Thought content normal.        Judgment: Judgment normal.     (all labs ordered are listed, but only abnormal results are displayed) Labs Reviewed  COMPREHENSIVE METABOLIC PANEL WITH GFR - Abnormal; Notable for the following components:      Result Value   Sodium 132 (*)    CO2 17 (*)    Glucose, Bld 113 (*)    BUN 48 (*)    Creatinine, Ser 2.82 (*)    Albumin  3.4 (*)    GFR, Estimated 22 (*)    Anion gap 17 (*)    All other components within normal limits  LACTIC ACID, PLASMA - Abnormal; Notable for the following components:   Lactic Acid, Venous 4.7 (*)    All other components within normal limits  CBC WITH DIFFERENTIAL/PLATELET - Abnormal; Notable for the following components:   WBC 12.0 (*)    RBC 2.00 (*)    Hemoglobin 6.1 (*)    HCT 19.9 (*)    Neutro Abs 9.6 (*)    Monocytes Absolute 1.1 (*)    All other components within normal limits  URINALYSIS, ROUTINE W REFLEX MICROSCOPIC - Abnormal; Notable for the following components:   Color, Urine RED (*)    APPearance CLOUDY (*)    Hgb urine  dipstick MODERATE (*)    Protein, ur 100 (*)    All other components within normal limits  POC OCCULT BLOOD, ED - Abnormal  TROPONIN I (HIGH SENSITIVITY) - Abnormal; Notable for the following components:   Troponin I (High Sensitivity) 87 (*)    All other components within normal limits  URINE CULTURE  MAGNESIUM   LACTIC ACID, PLASMA  TYPE AND SCREEN  PREPARE RBC (CROSSMATCH)  TROPONIN I (HIGH SENSITIVITY)    EKG: None  Radiology: CT ABDOMEN PELVIS WO CONTRAST Result Date: 12/25/2023 CLINICAL DATA:  Abdominal pain, acute, nonlocalized EXAM: CT ABDOMEN AND PELVIS WITHOUT CONTRAST TECHNIQUE: Multidetector CT imaging of the abdomen and pelvis was performed following the standard protocol without IV contrast. RADIATION DOSE REDUCTION: This exam was performed according to the departmental dose-optimization program which includes automated exposure control, adjustment of the mA and/or kV according to patient size and/or use of iterative reconstruction technique. COMPARISON:  October 09, 2023 FINDINGS: Of note, the lack of intravenous contrast limits evaluation of the solid organ parenchyma and vascularity. Lower chest: No focal airspace consolidation or pleural effusion. Hepatobiliary: No mass.Punctate gallstones. No wall thickening. No intrahepatic or extrahepatic biliary ductal dilation. Pancreas: No mass or main ductal dilation. No peripancreatic inflammation or fluid collection. Spleen: Normal size. No mass. Adrenals/Urinary Tract: No adrenal masses. No renal mass. Moderate bilateral hydronephrosis. No obstructive ureteral calculi. The urinary bladder is distended, volume estimated at 1000 mL. Stomach/Bowel: The stomach is decompressed without focal abnormality. No small bowel wall thickening or inflammation. No small bowel obstruction.Normal appendix. Large right inguinal hernia containing multiple segments of small bowel and intra-abdominal fat. Overall, the hernia sac measures 13.4 cm in craniocaudal  dimension. Vascular/Lymphatic: No aortic aneurysm. Diffuse aortoiliac atherosclerosis. No intraabdominal or pelvic lymphadenopathy. Reproductive: Moderate prostatomegaly.  No free pelvic fluid. Other: No pneumoperitoneum or ascites. Musculoskeletal: No acute fracture or destructive lesion. Multilevel degenerative disc disease of the spine. Mild superior endplate concavity at T12, likely degenerative. Severe bilateral hip osteoarthritis. Mild anasarca. IMPRESSION: 1. Moderate bilateral hydronephrosis. In the absence of obstructive ureteral calculi, this is likely due to vesicoureteral reflux from overdistention of the urinary bladder, volume estimated at 1000 mL. Correlation for causes of urinary retention recommended. 2. Large right inguinal hernia containing multiple segments of small bowel and intra-abdominal fat, measuring 13.4 cm in craniocaudal dimension. No bowel obstruction or findings of vascular compromise. 3. Cholecystolithiasis.  No changes of acute cholecystitis. 4. Moderate prostatomegaly. Aortic Atherosclerosis (ICD10-I70.0). Electronically Signed   By: Rogelia Myers M.D.   On: 12/25/2023 15:40   CT Cervical Spine Wo Contrast Result Date: 12/25/2023 CLINICAL DATA:  Status post fall. EXAM: CT CERVICAL SPINE WITHOUT CONTRAST TECHNIQUE: Multidetector CT imaging of the cervical spine was performed without intravenous contrast. Multiplanar CT image reconstructions were also generated. RADIATION DOSE REDUCTION: This exam was performed according to the departmental dose-optimization program which includes automated exposure control, adjustment of the mA and/or kV according to patient size and/or use of iterative reconstruction technique. COMPARISON:  None Available. FINDINGS: Alignment: There is straightening of the normal cervical spinal lordosis. Skull base and vertebrae: No acute fracture. No primary bone lesion or focal pathologic process. Soft tissues and spinal canal: No prevertebral fluid or  swelling. No visible canal hematoma. Disc levels: Marked severity endplate sclerosis, mild to moderate severity anterior osteophyte formation and posterior bony spurring are seen at the levels of C5-C6, C6-C7 and C7-T1. There is moderate to marked severity intervertebral disc space narrowing at the level of C6-C7, with marked severity intervertebral disc space narrowing at C7-T1. Bilateral marked severity multilevel facet joint hypertrophy is noted. Upper chest: Moderate severity biapical scarring and/or atelectasis is seen,  right greater than left. Other: None. IMPRESSION: Marked severity multilevel degenerative changes, as described above, without evidence of an acute fracture or subluxation. Electronically Signed   By: Suzen Dials M.D.   On: 12/25/2023 15:24   CT Head Wo Contrast Result Date: 12/25/2023 CLINICAL DATA:  Status post fall. EXAM: CT HEAD WITHOUT CONTRAST TECHNIQUE: Contiguous axial images were obtained from the base of the skull through the vertex without intravenous contrast. RADIATION DOSE REDUCTION: This exam was performed according to the departmental dose-optimization program which includes automated exposure control, adjustment of the mA and/or kV according to patient size and/or use of iterative reconstruction technique. COMPARISON:  None Available. FINDINGS: Brain: There is generalized cerebral atrophy with widening of the extra-axial spaces and ventricular dilatation. There are areas of decreased attenuation within the white matter tracts of the supratentorial brain, consistent with microvascular disease changes. Vascular: Marked severity calcification of the bilateral cavernous carotid arteries is noted. Skull: Normal. Negative for fracture or focal lesion. Sinuses/Orbits: No acute finding. Other: Mild right frontal and mild left posterior parietal scalp soft tissue swelling is seen. IMPRESSION: 1. Generalized cerebral atrophy and microvascular disease changes of the supratentorial  brain. 2. No acute intracranial abnormality. 3. Mild right frontal and mild left posterior parietal scalp soft tissue swelling. Electronically Signed   By: Suzen Dials M.D.   On: 12/25/2023 15:21   DG Chest Port 1 View Result Date: 12/25/2023 EXAM: 1 VIEW XRAY OF THE CHEST 12/25/2023 02:08:00 PM COMPARISON: None available. CLINICAL HISTORY: Fall, weakness. Pt c/o weakness, fall today, c/o dysuria. FINDINGS: LUNGS AND PLEURA: 1 cm left upper lobe pulmonary nodule. Biapical pleural parenchymal scarring. HEART AND MEDIASTINUM: Surgical changes in mediastinum. Intact sternotomy wires. Aortic calcification. BONES AND SOFT TISSUES: No acute osseous abnormality. IMPRESSION: 1. No acute findings. 2. 1 cm left upper lobe pulmonary nodule. Recommend CT chest for further evaluation. Electronically signed by: Donnice Mania MD 12/25/2023 02:26 PM EDT RP Workstation: [id number]     .Critical Care  Performed by: Daralene Lonni BIRCH, PA-C Authorized by: Daralene Lonni BIRCH, PA-C   Critical care provider statement:    Critical care time (minutes):  43   Critical care was necessary to treat or prevent imminent or life-threatening deterioration of the following conditions:  Sepsis   Critical care was time spent personally by me on the following activities:  Development of treatment plan with patient or surrogate, discussions with consultants, evaluation of patient's response to treatment, examination of patient, ordering and review of laboratory studies, ordering and review of radiographic studies, ordering and performing treatments and interventions, pulse oximetry, re-evaluation of patient's condition and review of old charts   I assumed direction of critical care for this patient from another provider in my specialty: no     Care discussed with: admitting provider      Medications Ordered in the ED  0.9 %  sodium chloride  infusion (Manually program via Guardrails IV Fluids) (has no administration in time  range)  lactated ringers  bolus 500 mL (has no administration in time range)  lactated ringers  infusion (has no administration in time range)  sodium chloride  0.9 % bolus 500 mL (500 mLs Intravenous New Bag/Given 12/25/23 1518)  morphine  (PF) 4 MG/ML injection 2 mg (2 mg Intravenous Given 12/25/23 1517)  ondansetron  (ZOFRAN ) injection 4 mg (4 mg Intravenous Given 12/25/23 1518)  cefTRIAXone  (ROCEPHIN ) 1 g in sodium chloride  0.9 % 100 mL IVPB (1 g Intravenous New Bag/Given 12/25/23 1605)  fentaNYL  (SUBLIMAZE ) injection 50 mcg (50  mcg Intravenous Given 12/25/23 1647)                                    Medical Decision Making Amount and/or Complexity of Data Reviewed Labs: ordered. Radiology: ordered.  Risk Prescription drug management. Decision regarding hospitalization.   This patient presents to the ED for concern of fall, generalized weakness, this involves an extensive number of treatment options, and is a complaint that carries with it a high risk of complications and morbidity.  The differential diagnosis includes sepsis, pneumonia, urinary tract infection, acute kidney injury, electrolyte derangement, intracranial hemorrhage, long bone or joint fracture, anemia, GI bleed   Co morbidities that complicate the patient evaluation  BPH, CAD, urethral strictures   Additional history obtained:  Additional history obtained from family External records from outside source obtained and reviewed including medical records   Lab Tests:  I Ordered, and personally interpreted labs.  The pertinent results include: Mild leukocytosis, anemia noted, hyponatremia, elevated creatinine and BUN, normal liver function, elevated serial troponins, elevated lactic acid but downtrending, urinalysis with blood and leukocytes, Hemoccult negative   Imaging Studies ordered:  I ordered imaging studies including CT scan head, cervical spine, abdomen and pelvis, chest x-ray I independently visualized and  interpreted imaging which showed no acute intracranial abnormality, no acute cervical spine fracture, bilateral hydronephrosis, bladder distention, inguinal hernia without obstruction I agree with the radiologist interpretation   Cardiac Monitoring: / EKG:  The patient was maintained on a cardiac monitor.  I personally viewed and interpreted the cardiac monitored which showed an underlying rhythm of: Atrial fibrillation, no ST/T wave changes, no ischemic changes, no STEMI   Consultations Obtained:  I requested consultation with the urology, Dr. Sherrilee, hospitalist,  and discussed lab and imaging findings as well as pertinent plan - they recommend: Foley catheter placed by urology, admission   Problem List / ED Course / Critical interventions / Medication management  Patient does remain stable at this time.  Discussed with family that we will plan for admission to the hospitalist service given his acute kidney injury, urinary tract infection, apparent sepsis, increased weakness, and anemia.  Hemoccult test was performed which was negative with only brown stool in the rectal vault.  He does have diffuse hematuria at this point.  Patient and family were in agreement to receive blood at this point given his hemoglobin of 6.1.  He has had no further tachycardia and his blood pressure has remained stable.  He has not been febrile while in the emergency department.  Have started him on antibiotics for his urinary tract infection.  Have been slowly providing fluids at this point to avoid fluid overload as he has no indication for septic shock.  CT scan head and cervical spine were unremarkable any signs of acute traumatic injury.  He does have an abrasion over the left posterior aspect of the scalp which does not warrant suturing or staples at this time.  Tetanus shot has been updated.  CT scan of the abdomen and pelvis otherwise demonstrated no acute surgical process.  Have discussed patient case with Dr.  Barbra with the hospital service who has decided for admission. I ordered medication including IV fluids, Rocephin , morphine , Zofran , fentanyl  for sepsis, urinary tract infection Reevaluation of the patient after these medicines showed that the patient improved I have reviewed the patients home medicines and have made adjustments as needed   Social  Determinants of Health:  None   Test / Admission - Considered:  Admission     Final diagnoses:  Sepsis, due to unspecified organism, unspecified whether acute organ dysfunction present (HCC)  Hematuria, unspecified type  Acute cystitis with hematuria  Benign prostatic hyperplasia with lower urinary tract symptoms, symptom details unspecified  Acute kidney injury (HCC)  Anemia, unspecified type    ED Discharge Orders     None          Daralene Lonni BIRCH, PA-C 12/25/23 1819    Towana Ozell BROCKS, MD 12/26/23 [phone]

## 2023-12-25 NOTE — ED Triage Notes (Signed)
 Pt's family stated that pt fell last night which resulting in a head wound and wound behind his left ear. Pt is not on blood thinners according to family. Pt's family stated that he is not acting out of character

## 2023-12-25 NOTE — Plan of Care (Signed)
  Problem: Education: Goal: Knowledge of General Education information will improve Description: Including pain rating scale, medication(s)/side effects and non-pharmacologic comfort measures Outcome: Progressing   Problem: Clinical Measurements: Goal: Cardiovascular complication will be avoided Outcome: Progressing   Problem: Nutrition: Goal: Adequate nutrition will be maintained Outcome: Progressing   Problem: Coping: Goal: Level of anxiety will decrease Outcome: Progressing   Problem: Pain Managment: Goal: General experience of comfort will improve and/or be controlled Outcome: Progressing   Problem: Safety: Goal: Ability to remain free from injury will improve Outcome: Progressing   Problem: Skin Integrity: Goal: Risk for impaired skin integrity will decrease Outcome: Progressing

## 2023-12-25 NOTE — ED Notes (Signed)
 When offering tdap- patient states he doesn't want it. Marked at not given - refused in [facility]

## 2023-12-25 NOTE — ED Notes (Signed)
 Attempted to call report however 300 stated they were not going to take this patient.

## 2023-12-26 DIAGNOSIS — A419 Sepsis, unspecified organism: Secondary | ICD-10-CM | POA: Diagnosis not present

## 2023-12-26 DIAGNOSIS — N390 Urinary tract infection, site not specified: Secondary | ICD-10-CM

## 2023-12-26 LAB — URINE CULTURE: Culture: NO GROWTH

## 2023-12-26 LAB — BASIC METABOLIC PANEL WITH GFR
Anion gap: 7 (ref 5–15)
BUN: 38 mg/dL — ABNORMAL HIGH (ref 8–23)
CO2: 23 mmol/L (ref 22–32)
Calcium: 8.5 mg/dL — ABNORMAL LOW (ref 8.9–10.3)
Chloride: 104 mmol/L (ref 98–111)
Creatinine, Ser: 2.01 mg/dL — ABNORMAL HIGH (ref 0.61–1.24)
GFR, Estimated: 32 mL/min — ABNORMAL LOW (ref >60)
Glucose, Bld: 102 mg/dL — ABNORMAL HIGH (ref 70–99)
Potassium: 4.6 mmol/L (ref 3.5–5.1)
Sodium: 134 mmol/L — ABNORMAL LOW (ref 135–145)

## 2023-12-26 LAB — CBC
HCT: 22.1 % — ABNORMAL LOW (ref 39.0–52.0)
Hemoglobin: 7.6 g/dL — ABNORMAL LOW (ref 13.0–17.0)
MCH: 31.7 pg (ref 26.0–34.0)
MCHC: 34.4 g/dL (ref 30.0–36.0)
MCV: 92.1 fL (ref 80.0–100.0)
Platelets: 239 K/uL (ref 150–400)
RBC: 2.4 MIL/uL — ABNORMAL LOW (ref 4.22–5.81)
RDW: 13.8 % (ref 11.5–15.5)
WBC: 11.1 K/uL — ABNORMAL HIGH (ref 4.0–10.5)
nRBC: 0 % (ref 0.0–0.2)

## 2023-12-26 LAB — LACTIC ACID, PLASMA: Lactic Acid, Venous: 1.6 mmol/L (ref 0.5–1.9)

## 2023-12-26 LAB — HEMOGLOBIN AND HEMATOCRIT, BLOOD
HCT: 24.1 % — ABNORMAL LOW (ref 39.0–52.0)
Hemoglobin: 8.2 g/dL — ABNORMAL LOW (ref 13.0–17.0)

## 2023-12-26 MED ORDER — TRAZODONE HCL 50 MG PO TABS
50.0000 mg | ORAL_TABLET | Freq: Every day | ORAL | Status: DC
  Administered 2023-12-26: 50 mg via ORAL
  Filled 2023-12-26: qty 1

## 2023-12-26 MED ORDER — MELATONIN 3 MG PO TABS
3.0000 mg | ORAL_TABLET | Freq: Every day | ORAL | Status: DC
  Administered 2023-12-26: 3 mg via ORAL
  Filled 2023-12-26: qty 1

## 2023-12-26 MED ORDER — LACTATED RINGERS IV SOLN: INTRAVENOUS | Status: DC

## 2023-12-26 MED ORDER — HYDROMORPHONE HCL 1 MG/ML IJ SOLN
1.0000 mg | INTRAMUSCULAR | Status: DC | PRN
  Administered 2023-12-27: 1 mg via INTRAVENOUS
  Filled 2023-12-26: qty 1

## 2023-12-26 MED ORDER — CHLORHEXIDINE GLUCONATE CLOTH 2 % EX PADS
6.0000 | MEDICATED_PAD | Freq: Every day | CUTANEOUS | Status: DC
Start: 2023-12-26 — End: 2023-12-29
  Administered 2023-12-26 – 2023-12-29 (×4): 6 via TOPICAL

## 2023-12-26 NOTE — Plan of Care (Signed)
  Problem: Health Behavior/Discharge Planning: Goal: Ability to manage health-related needs will improve Outcome: Progressing   Problem: Education: Goal: Knowledge of General Education information will improve Description: Including pain rating scale, medication(s)/side effects and non-pharmacologic comfort measures Outcome: Progressing   Problem: Education: Goal: Knowledge of General Education information will improve Description: Including pain rating scale, medication(s)/side effects and non-pharmacologic comfort measures Outcome: Progressing

## 2023-12-26 NOTE — Hospital Course (Addendum)
 Clayton Austin is a 83 y.o. male with medical history significant of hypertension, hypothyroidism, hyperlipidemia, coronary artery disease with history of CABG x 5 vessels in and NSTEMI in 2014, paroxysmal atrial fibrillation on anticoagulation.  Patient has been having difficulty urinating for the past 3 to 4 days.   Around the same time, he has noticed some hematuria.  Per his recollection, this has never happened before. He came to the hospital he came to the hospital due to suprapubic pain and difficulty urinating.  No fevers or chills although last night he got weak and fell, striking his head. Due to inability to urinate, urology came and placed a Foley catheter.   Per EDP 7 ureteral stones were present which were pushed back into the bladder.  Clayton Austin hematuria was noted.    Urology, Dr. Sherrilee,     Assessment & Plan:   Principal Problem:   Sepsis [facility]) Active Problems:   CAD (coronary artery disease) of artery bypass graft   Hypothyroidism   Benign prostatic hyperplasia with weak urinary stream   Benign essential HTN   Bladder stone   Paroxysmal atrial fibrillation (HCC)   Atrial fibrillation with RVR (HCC)     Urosepsis sepsis (HCC) POA: Met sepsis criteria due to UTI, nephrolithiasis  -During day 12/27/23 -patient was found hypothermic, mildly hypotensive-transferred to stepdown unit for close observation - Status post 2U PRBC transfusion, and bolus LR- -currently hemodynamically stable, mildly hypotensive with a BP of 102/46, temp 97.6, satting 100% - Overall improving SIRS physiology  -Continue current antibiotics   -Hypertensive otherwise hemodynamically stable this morning  -Status post Foley catheter placement-noted light bloody urine  -Will monitor closely -Will follow-up with cultures   Hematuria with ureteral obstruction due to ureteral stone - Due to UTI ureter stones -Foley catheter in place -Monitoring closely, continue IVF IV  antibiotics   Acute on chronic anemia exacerbated by hematuria -POA hemoglobin 6.1>> 7.6, 6.9>>> 9.89  -Monitoring H&H - Holding anticoagulation - S/p 1U PRBC transfusion on this admission - 12/27/2023, pursuing with 2U PRBC blood transfusion     Latest Ref Rng & Units 12/28/2023    4:26 AM 12/27/2023    8:44 PM 12/27/2023    6:51 AM  CBC  WBC 4.0 - 10.5 K/uL 11.5     Hemoglobin 13.0 - 17.0 g/dL 9.9  89.7  6.9   Hematocrit 39.0 - 52.0 % 29.0  30.3  20.6   Platelets 150 - 400 K/uL 250       Acute renal insufficiency  - Ruling out CKD-improving BUN/creatinine - Exacerbated by nephrolithiasis, UTI - Foley catheter in place -Monitor kidney function closely - Avoid nephrotoxins, hypotension Lab Results  Component Value Date   CREATININE 1.26 (H) 12/28/2023   CREATININE 1.41 (H) 12/27/2023   CREATININE 2.01 (H) 12/26/2023     CAD (coronary artery disease) of artery bypass graft - Continue home medication, holding anticoagulation  Hypothyroidism - Continue Synthroid    Benign prostatic hyperplasia with weak urinary stream -Monitoring, Foley catheter currently in place -Continue finasteride ,  -Follow-up with urologist  Benign essential HTN - hypotensive - BP soft, holding Norvasc , losartan    Bladder stone -management as above Foley catheter in place   Paroxysmal atrial fibrillation (HCC) -holding Eliquis, continue metoprolol 

## 2023-12-26 NOTE — Progress Notes (Signed)
   12/26/23 1245  TOC Brief Assessment  Insurance and Status Reviewed  Patient has primary care physician Yes (FANTA, TESFAYE DEMISSIE)  Home environment has been reviewed From home has a spouse as contact- lives alone.  Prior level of function: Independent  Social Drivers of Health Review SDOH reviewed no interventions necessary  Readmission risk has been reviewed Yes  Transition of care needs transition of care needs identified, TOC will continue to follow   Pt reports he lives alone, PT/OT evaluation ordered.  Pt may need additional support. ICM following.

## 2023-12-26 NOTE — Progress Notes (Signed)
 PROGRESS NOTE    Patient: Clayton Austin                            PCP: Fanta, Tesfaye Demissie, MD                    DOB: [birth year]            DOA: 12/25/2023 FMW:[id number]             DOS: 12/26/2023, 11:46 AM   LOS: 1 day   Date of Service: The patient was seen and examined on 12/26/2023  Subjective:   The patient was seen and examined this morning. Hemodynamically stable. Foley catheter in place, light bloody urine noted  Brief Narrative:   Clayton Austin is a 83 y.o. male with medical history significant of hypertension, hypothyroidism, hyperlipidemia, coronary artery disease with history of CABG x 5 vessels in and NSTEMI in 2014, paroxysmal atrial fibrillation on anticoagulation.  Patient has been having difficulty urinating for the past 3 to 4 days.   Around the same time, he has noticed some hematuria.  Per his recollection, this has never happened before. He came to the hospital he came to the hospital due to suprapubic pain and difficulty urinating.  No fevers or chills although last night he got weak and fell, striking his head. Due to inability to urinate, urology came and placed a Foley catheter.   Per EDP 7 ureteral stones were present which were pushed back into the bladder.  [name] was noted.    Urology, Dr. Sherrilee,     Assessment & Plan:   Principal Problem:   Sepsis [facility]) Active Problems:   CAD (coronary artery disease) of artery bypass graft   Hypothyroidism   Benign prostatic hyperplasia with weak urinary stream   Benign essential HTN   Bladder stone   Paroxysmal atrial fibrillation (HCC)   Atrial fibrillation with RVR (HCC)     Urosepsis sepsis (HCC) - POA met sepsis criteria due to UTI, nephrolithiasis -Improved sepsis physiology, hemodynamically stable now -Status post Foley catheter placement-noted light bloody urine -Will monitor closely -Continue IVF, IV antibiotics -Will follow-up with cultures   Hematuria with ureteral  obstruction due to ureteral stone - Due to UTI ureter stones -Foley catheter in place -Monitoring closely, continue IVF IV antibiotics   Acute on chronic anemia exacerbated by hematuria -POA hemoglobin 6.1 -Monitoring H&H - Holding anticoagulation - S/p 1U PRBC transfusion on this admission    Latest Ref Rng & Units 12/26/2023    4:56 AM 12/26/2023   12:36 AM 12/25/2023    3:53 PM  CBC  WBC 4.0 - 10.5 K/uL 11.1   12.0   Hemoglobin 13.0 - 17.0 g/dL 7.6  8.2  6.1   Hematocrit 39.0 - 52.0 % 22.1  24.1  19.9   Platelets 150 - 400 K/uL 239   268     Acute renal insufficiency  - Ruling out CKD - Exacerbated by nephrolithiasis, UTI - Foley catheter in place -Monitor kidney function closely - Avoid nephrotoxins, hypotension Lab Results  Component Value Date   CREATININE 2.01 (H) 12/26/2023   CREATININE 2.40 (H) 12/25/2023   CREATININE 2.82 (H) 12/25/2023       CAD (coronary artery disease) of artery bypass graft - Continue home medication, holding anticoagulation    Hypothyroidism - Continue Synthroid     Benign prostatic hyperplasia with weak urinary stream -Monitoring, Foley catheter currently  in place -Continue finasteride ,  -Follow-up with urologist  Benign essential HTN BP Soft on admission-holding BP meds, including Norvasc , losartan   Bladder stone -management as above Foley catheter in place   Paroxysmal atrial fibrillation (HCC) -holding Eliquis, continue metoprolol        -------------------------------------------------------------------------------------------------------------------------- Nutritional status:  The patient's BMI is: Body mass index is 17.25 kg/m. I agree with the assessment and plan as outlined     ---------------------------------------------------------------------------------------------------------------------- Cultures; Blood Cultures x 2 >> Urine Culture  >>>    ----------------------------------------------------------------------------------------------------------------------------  DVT prophylaxis:  SCDs Start: 12/25/23 1926   Code Status:   Code Status: Full Code  Family Communication: No family member present at bedside-  -Advance care planning has been discussed.   Admission status:   Status is: Inpatient Remains inpatient appropriate because: Needing treatment for sepsis, IV fluid, IV antibiotics, Foley catheter   Disposition: From  - home             Planning for discharge in 1-2 days   Procedures:   No admission procedures for hospital encounter.   Antimicrobials:  Anti-infectives (From admission, onward)    Start     Dose/Rate Route Frequency Ordered Stop   12/26/23 1600  cefTRIAXone  (ROCEPHIN ) 1 g in sodium chloride  0.9 % 100 mL IVPB        1 g 200 mL/hr over 30 Minutes Intravenous Every 24 hours 12/25/23 1927     12/25/23 1600  cefTRIAXone  (ROCEPHIN ) 1 g in sodium chloride  0.9 % 100 mL IVPB        1 g 200 mL/hr over 30 Minutes Intravenous  Once 12/25/23 1553 12/25/23 1730        Medication:   atorvastatin   20 mg Oral q morning   Chlorhexidine  Gluconate Cloth  6 each Topical Daily   feeding supplement  237 mL Oral BID BM   finasteride   5 mg Oral Daily   levothyroxine   100 mcg Oral Q0600   metoprolol  tartrate  25 mg Oral BID   pantoprazole   40 mg Oral Daily   tamsulosin   0.4 mg Oral BID   Tdap  0.5 mL Intramuscular Once    acetaminophen  **OR** acetaminophen , ondansetron  **OR** ondansetron  (ZOFRAN ) IV   Objective:   Vitals:   12/25/23 2212 12/26/23 0057 12/26/23 0450 12/26/23 0845  BP: (!) 103/48 (!) 110/52 128/61 (!) 110/53  Pulse: 71 61 65 75  Resp: 20 20 14    Temp: 97.8 F (36.6 C) 97.6 F (36.4 C) (!) 97.4 F (36.3 C)   TempSrc: Oral Oral Oral   SpO2:  100% 100%   Weight:      Height:        Intake/Output Summary (Last 24 hours) at 12/26/2023 1146 Last data filed at 12/26/2023 0830 Gross  per 24 hour  Intake 4810.37 ml  Output 1800 ml  Net 3010.37 ml   Filed Weights   12/25/23 1244 12/25/23 2115  Weight: 68 kg 59.3 kg     Physical examination:   General:  AAO x 3,  cooperative, no distress;   HEENT:  Normocephalic, PERRL, otherwise with in Normal limits   Neuro:  CNII-XII intact. , normal motor and sensation, reflexes intact   Lungs:   Clear to auscultation BL, Respirations unlabored,  No wheezes / crackles  Cardio:    S1/S2, RRR, No murmure, No Rubs or Gallops   Abdomen:  Soft, non-tender, bowel sounds active all four quadrants, no guarding or peritoneal signs.  Muscular  skeletal:  Limited exam -global generalized  weaknesses - in bed, able to move all 4 extremities,   2+ pulses,  symmetric, No pitting edema  Skin:  Dry, warm to touch, negative for any Rashes,  Wounds: Please see nursing documentation       ------------------------------------------------------------------------------------------------------------------------------------------    LABs:     Latest Ref Rng & Units 12/26/2023    4:56 AM 12/26/2023   12:36 AM 12/25/2023    3:53 PM  CBC  WBC 4.0 - 10.5 K/uL 11.1   12.0   Hemoglobin 13.0 - 17.0 g/dL 7.6  8.2  6.1   Hematocrit 39.0 - 52.0 % 22.1  24.1  19.9   Platelets 150 - 400 K/uL 239   268       Latest Ref Rng & Units 12/26/2023    4:56 AM 12/25/2023    7:41 PM 12/25/2023    3:53 PM  CMP  Glucose 70 - 99 mg/dL 897  899  886   BUN 8 - 23 mg/dL 38  43  48   Creatinine 0.61 - 1.24 mg/dL 7.98  7.59  7.17   Sodium 135 - 145 mmol/L 134  131  132   Potassium 3.5 - 5.1 mmol/L 4.6  4.3  4.8   Chloride 98 - 111 mmol/L 104  100  98   CO2 22 - 32 mmol/L 23  17  17    Calcium  8.9 - 10.3 mg/dL 8.5  8.5  9.0   Total Protein 6.5 - 8.1 g/dL   6.7   Total Bilirubin 0.0 - 1.2 mg/dL   0.9   Alkaline Phos 38 - 126 U/L   61   AST 15 - 41 U/L   36   ALT 0 - 44 U/L   16        Micro Results No results found for this or any previous visit (from the past  240 hours).  Radiology Reports CT ABDOMEN PELVIS WO CONTRAST Result Date: 12/25/2023 CLINICAL DATA:  Abdominal pain, acute, nonlocalized EXAM: CT ABDOMEN AND PELVIS WITHOUT CONTRAST TECHNIQUE: Multidetector CT imaging of the abdomen and pelvis was performed following the standard protocol without IV contrast. RADIATION DOSE REDUCTION: This exam was performed according to the departmental dose-optimization program which includes automated exposure control, adjustment of the mA and/or kV according to patient size and/or use of iterative reconstruction technique. COMPARISON:  October 09, 2023 FINDINGS: Of note, the lack of intravenous contrast limits evaluation of the solid organ parenchyma and vascularity. Lower chest: No focal airspace consolidation or pleural effusion. Hepatobiliary: No mass.Punctate gallstones. No wall thickening. No intrahepatic or extrahepatic biliary ductal dilation. Pancreas: No mass or main ductal dilation. No peripancreatic inflammation or fluid collection. Spleen: Normal size. No mass. Adrenals/Urinary Tract: No adrenal masses. No renal mass. Moderate bilateral hydronephrosis. No obstructive ureteral calculi. The urinary bladder is distended, volume estimated at 1000 mL. Stomach/Bowel: The stomach is decompressed without focal abnormality. No small bowel wall thickening or inflammation. No small bowel obstruction.Normal appendix. Large right inguinal hernia containing multiple segments of small bowel and intra-abdominal fat. Overall, the hernia sac measures 13.4 cm in craniocaudal dimension. Vascular/Lymphatic: No aortic aneurysm. Diffuse aortoiliac atherosclerosis. No intraabdominal or pelvic lymphadenopathy. Reproductive: Moderate prostatomegaly.  No free pelvic fluid. Other: No pneumoperitoneum or ascites. Musculoskeletal: No acute fracture or destructive lesion. Multilevel degenerative disc disease of the spine. Mild superior endplate concavity at T12, likely degenerative. Severe  bilateral hip osteoarthritis. Mild anasarca. IMPRESSION: 1. Moderate bilateral hydronephrosis. In the absence of obstructive ureteral calculi, this is likely  due to vesicoureteral reflux from overdistention of the urinary bladder, volume estimated at 1000 mL. Correlation for causes of urinary retention recommended. 2. Large right inguinal hernia containing multiple segments of small bowel and intra-abdominal fat, measuring 13.4 cm in craniocaudal dimension. No bowel obstruction or findings of vascular compromise. 3. Cholecystolithiasis.  No changes of acute cholecystitis. 4. Moderate prostatomegaly. Aortic Atherosclerosis (ICD10-I70.0). Electronically Signed   By: Rogelia Myers M.D.   On: 12/25/2023 15:40   CT Cervical Spine Wo Contrast Result Date: 12/25/2023 CLINICAL DATA:  Status post fall. EXAM: CT CERVICAL SPINE WITHOUT CONTRAST TECHNIQUE: Multidetector CT imaging of the cervical spine was performed without intravenous contrast. Multiplanar CT image reconstructions were also generated. RADIATION DOSE REDUCTION: This exam was performed according to the departmental dose-optimization program which includes automated exposure control, adjustment of the mA and/or kV according to patient size and/or use of iterative reconstruction technique. COMPARISON:  None Available. FINDINGS: Alignment: There is straightening of the normal cervical spinal lordosis. Skull base and vertebrae: No acute fracture. No primary bone lesion or focal pathologic process. Soft tissues and spinal canal: No prevertebral fluid or swelling. No visible canal hematoma. Disc levels: Marked severity endplate sclerosis, mild to moderate severity anterior osteophyte formation and posterior bony spurring are seen at the levels of C5-C6, C6-C7 and C7-T1. There is moderate to marked severity intervertebral disc space narrowing at the level of C6-C7, with marked severity intervertebral disc space narrowing at C7-T1. Bilateral marked severity  multilevel facet joint hypertrophy is noted. Upper chest: Moderate severity biapical scarring and/or atelectasis is seen, right greater than left. Other: None. IMPRESSION: Marked severity multilevel degenerative changes, as described above, without evidence of an acute fracture or subluxation. Electronically Signed   By: Suzen Dials M.D.   On: 12/25/2023 15:24   CT Head Wo Contrast Result Date: 12/25/2023 CLINICAL DATA:  Status post fall. EXAM: CT HEAD WITHOUT CONTRAST TECHNIQUE: Contiguous axial images were obtained from the base of the skull through the vertex without intravenous contrast. RADIATION DOSE REDUCTION: This exam was performed according to the departmental dose-optimization program which includes automated exposure control, adjustment of the mA and/or kV according to patient size and/or use of iterative reconstruction technique. COMPARISON:  None Available. FINDINGS: Brain: There is generalized cerebral atrophy with widening of the extra-axial spaces and ventricular dilatation. There are areas of decreased attenuation within the white matter tracts of the supratentorial brain, consistent with microvascular disease changes. Vascular: Marked severity calcification of the bilateral cavernous carotid arteries is noted. Skull: Normal. Negative for fracture or focal lesion. Sinuses/Orbits: No acute finding. Other: Mild right frontal and mild left posterior parietal scalp soft tissue swelling is seen. IMPRESSION: 1. Generalized cerebral atrophy and microvascular disease changes of the supratentorial brain. 2. No acute intracranial abnormality. 3. Mild right frontal and mild left posterior parietal scalp soft tissue swelling. Electronically Signed   By: Suzen Dials M.D.   On: 12/25/2023 15:21   DG Chest Port 1 View Result Date: 12/25/2023 EXAM: 1 VIEW XRAY OF THE CHEST 12/25/2023 02:08:00 PM COMPARISON: None available. CLINICAL HISTORY: Fall, weakness. Pt c/o weakness, fall today, c/o dysuria.  FINDINGS: LUNGS AND PLEURA: 1 cm left upper lobe pulmonary nodule. Biapical pleural parenchymal scarring. HEART AND MEDIASTINUM: Surgical changes in mediastinum. Intact sternotomy wires. Aortic calcification. BONES AND SOFT TISSUES: No acute osseous abnormality. IMPRESSION: 1. No acute findings. 2. 1 cm left upper lobe pulmonary nodule. Recommend CT chest for further evaluation. Electronically signed by: Donnice Mania MD 12/25/2023 02:26 PM  EDT RP Workstation: [id number]    SIGNED: Adriana DELENA Grams, MD, FHM. FAAFP. Jolynn Pack - [facility] Time spent - 55 min.  In seeing, evaluating and examining the patient. Reviewing medical records, labs, drawn plan of care. [facility],  Pager (please use [email or web address] to page/ text) Please use Epic Secure Chat for non-urgent communication (7AM-7PM)  If 7PM-7AM, please contact night-coverage [email or web address], 12/26/2023, 11:46 AM

## 2023-12-27 DIAGNOSIS — I959 Hypotension, unspecified: Secondary | ICD-10-CM

## 2023-12-27 DIAGNOSIS — A419 Sepsis, unspecified organism: Secondary | ICD-10-CM | POA: Diagnosis not present

## 2023-12-27 DIAGNOSIS — N390 Urinary tract infection, site not specified: Secondary | ICD-10-CM | POA: Diagnosis not present

## 2023-12-27 LAB — COMPREHENSIVE METABOLIC PANEL WITH GFR
ALT: 14 U/L (ref 0–44)
AST: 23 U/L (ref 15–41)
Albumin: 2.4 g/dL — ABNORMAL LOW (ref 3.5–5.0)
Alkaline Phosphatase: 51 U/L (ref 38–126)
Anion gap: 5 (ref 5–15)
BUN: 26 mg/dL — ABNORMAL HIGH (ref 8–23)
CO2: 24 mmol/L (ref 22–32)
Calcium: 8.1 mg/dL — ABNORMAL LOW (ref 8.9–10.3)
Chloride: 106 mmol/L (ref 98–111)
Creatinine, Ser: 1.41 mg/dL — ABNORMAL HIGH (ref 0.61–1.24)
GFR, Estimated: 49 mL/min — ABNORMAL LOW (ref >60)
Glucose, Bld: 108 mg/dL — ABNORMAL HIGH (ref 70–99)
Potassium: 4.1 mmol/L (ref 3.5–5.1)
Sodium: 135 mmol/L (ref 135–145)
Total Bilirubin: 0.8 mg/dL (ref 0.0–1.2)
Total Protein: 5.1 g/dL — ABNORMAL LOW (ref 6.5–8.1)

## 2023-12-27 LAB — CBC
HCT: 20.4 % — ABNORMAL LOW (ref 39.0–52.0)
Hemoglobin: 6.9 g/dL — CL (ref 13.0–17.0)
MCH: 32.1 pg (ref 26.0–34.0)
MCHC: 33.8 g/dL (ref 30.0–36.0)
MCV: 94.9 fL (ref 80.0–100.0)
Platelets: 239 K/uL (ref 150–400)
RBC: 2.15 MIL/uL — ABNORMAL LOW (ref 4.22–5.81)
RDW: 14.1 % (ref 11.5–15.5)
WBC: 12.5 K/uL — ABNORMAL HIGH (ref 4.0–10.5)
nRBC: 0 % (ref 0.0–0.2)

## 2023-12-27 LAB — PREPARE RBC (CROSSMATCH)

## 2023-12-27 LAB — GLUCOSE, CAPILLARY: Glucose-Capillary: 111 mg/dL — ABNORMAL HIGH (ref 70–99)

## 2023-12-27 LAB — HEMOGLOBIN AND HEMATOCRIT, BLOOD
HCT: 20.6 % — ABNORMAL LOW (ref 39.0–52.0)
HCT: 30.3 % — ABNORMAL LOW (ref 39.0–52.0)
Hemoglobin: 6.9 g/dL — CL (ref 13.0–17.0)
Hemoglobin: 10.2 g/dL — ABNORMAL LOW (ref 13.0–17.0)

## 2023-12-27 LAB — LACTIC ACID, PLASMA: Lactic Acid, Venous: 2.1 mmol/L (ref 0.5–1.9)

## 2023-12-27 LAB — MRSA NEXT GEN BY PCR, NASAL: MRSA by PCR Next Gen: NOT DETECTED

## 2023-12-27 MED ORDER — DIPHENHYDRAMINE HCL 25 MG PO CAPS
25.0000 mg | ORAL_CAPSULE | Freq: Once | ORAL | Status: DC
  Filled 2023-12-27: qty 1

## 2023-12-27 MED ORDER — HYDROMORPHONE HCL 1 MG/ML IJ SOLN
0.5000 mg | INTRAMUSCULAR | Status: DC | PRN
  Administered 2023-12-29: 0.5 mg via INTRAVENOUS
  Filled 2023-12-27: qty 0.5

## 2023-12-27 MED ORDER — SENNOSIDES 8.8 MG/5ML PO SYRP
10.0000 mL | ORAL_SOLUTION | Freq: Two times a day (BID) | ORAL | Status: DC
  Administered 2023-12-28 – 2023-12-30 (×4): 10 mL via ORAL
  Filled 2023-12-27 (×10): qty 10

## 2023-12-27 MED ORDER — LACTATED RINGERS IV BOLUS
500.0000 mL | Freq: Once | INTRAVENOUS | Status: AC
  Administered 2023-12-27: 500 mL via INTRAVENOUS

## 2023-12-27 MED ORDER — SODIUM CHLORIDE 0.9% IV SOLUTION: Freq: Once | INTRAVENOUS | Status: AC

## 2023-12-27 MED ORDER — METOPROLOL TARTRATE 25 MG PO TABS
25.0000 mg | ORAL_TABLET | Freq: Two times a day (BID) | ORAL | Status: DC
Start: 2023-12-28 — End: 2023-12-28

## 2023-12-27 MED ORDER — ACETAMINOPHEN 325 MG PO TABS
650.0000 mg | ORAL_TABLET | Freq: Once | ORAL | Status: AC
  Administered 2023-12-27: 650 mg via ORAL
  Filled 2023-12-27: qty 2

## 2023-12-27 MED ORDER — MIDODRINE HCL 5 MG PO TABS
5.0000 mg | ORAL_TABLET | Freq: Three times a day (TID) | ORAL | Status: DC
  Administered 2023-12-27: 5 mg via ORAL
  Filled 2023-12-27: qty 1

## 2023-12-27 MED ORDER — ORAL CARE MOUTH RINSE: 15.0000 mL | OROMUCOSAL | Status: DC | PRN

## 2023-12-27 MED ORDER — MELATONIN 3 MG PO TABS
6.0000 mg | ORAL_TABLET | Freq: Every day | ORAL | Status: DC
  Administered 2023-12-27 – 2023-12-30 (×4): 6 mg via ORAL
  Filled 2023-12-27 (×4): qty 2

## 2023-12-27 NOTE — Progress Notes (Signed)
 PROGRESS NOTE    Patient: Clayton Austin                            PCP: Fanta, Tesfaye Demissie, MD                    DOB: [birth year]            DOA: 12/25/2023 FMW:[id number]             DOS: 12/27/2023, 11:09 AM   LOS: 2 days   Date of Service: The patient was seen and examined on 12/27/2023  Subjective:   The patient was seen and examined this morning, sleepy, drowsy but arousable Mildly hypotensive, noted for drop in H&H - Foley catheter in place, pink hue urine noted in bag Received trazodone  and melatonin for sleep overnight  Brief Narrative:   Clayton Austin is a 83 y.o. male with medical history significant of hypertension, hypothyroidism, hyperlipidemia, coronary artery disease with history of CABG x 5 vessels in and NSTEMI in 2014, paroxysmal atrial fibrillation on anticoagulation.  Patient has been having difficulty urinating for the past 3 to 4 days.   Around the same time, he has noticed some hematuria.  Per his recollection, this has never happened before. He came to the hospital he came to the hospital due to suprapubic pain and difficulty urinating.  No fevers or chills although last night he got weak and fell, striking his head. Due to inability to urinate, urology came and placed a Foley catheter.   Per EDP 7 ureteral stones were present which were pushed back into the bladder.  [name] was noted.    Urology, Dr. Sherrilee,     Assessment & Plan:   Principal Problem:   Sepsis [facility]) Active Problems:   CAD (coronary artery disease) of artery bypass graft   Hypothyroidism   Benign prostatic hyperplasia with weak urinary stream   Benign essential HTN   Bladder stone   Paroxysmal atrial fibrillation (HCC)   Atrial fibrillation with RVR (HCC)     Urosepsis sepsis (HCC) -Hypertensive otherwise hemodynamically stable this morning - POA met sepsis criteria due to UTI, nephrolithiasis -Improved sepsis physiology, hemodynamically stable now -Status  post Foley catheter placement-noted light bloody urine -Will monitor closely -Continue IVF, IV antibiotics -Will follow-up with cultures   Hematuria with ureteral obstruction due to ureteral stone - Due to UTI ureter stones -Foley catheter in place -Monitoring closely, continue IVF IV antibiotics   Acute on chronic anemia exacerbated by hematuria -POA hemoglobin 6.1>> 7.6, 6.9 this a.m. -Monitoring H&H - Holding anticoagulation - S/p 1U PRBC transfusion on this admission - 12/27/2023, pursuing with 2U PRBC blood transfusion     Latest Ref Rng & Units 12/27/2023    6:51 AM 12/27/2023    4:23 AM 12/26/2023    4:56 AM  CBC  WBC 4.0 - 10.5 K/uL  12.5  11.1   Hemoglobin 13.0 - 17.0 g/dL 6.9  6.9  7.6   Hematocrit 39.0 - 52.0 % 20.6  20.4  22.1   Platelets 150 - 400 K/uL  239  239     Acute renal insufficiency  - Ruling out CKD-improving BUN/creatinine - Exacerbated by nephrolithiasis, UTI - Foley catheter in place -Monitor kidney function closely - Avoid nephrotoxins, hypotension Lab Results  Component Value Date   CREATININE 1.41 (H) 12/27/2023   CREATININE 2.01 (H) 12/26/2023   CREATININE 2.40 (H) 12/25/2023  CAD (coronary artery disease) of artery bypass graft - Continue home medication, holding anticoagulation  Hypothyroidism - Continue Synthroid    Benign prostatic hyperplasia with weak urinary stream -Monitoring, Foley catheter currently in place -Continue finasteride ,  -Follow-up with urologist  Benign essential HTN - hypotensive - BP soft, holding Norvasc , losartan    Bladder stone -management as above Foley catheter in place   Paroxysmal atrial fibrillation (HCC) -holding Eliquis, continue metoprolol        -------------------------------------------------------------------------------------------------------------------------- Nutritional status:  The patient's BMI is: Body mass index is 17.25 kg/m. I agree with the assessment and plan as  outlined     ---------------------------------------------------------------------------------------------------------------------- Cultures; Blood Cultures x 2 >> Urine Culture  >>>   ----------------------------------------------------------------------------------------------------------------------------  DVT prophylaxis:  SCDs Start: 12/25/23 1926   Code Status:   Code Status: Full Code  Family Communication: No family member present at bedside-  -Advance care planning has been discussed.   Admission status:   Status is: Inpatient Remains inpatient appropriate because: Needing treatment for sepsis, IV fluid, IV antibiotics, Foley catheter   Disposition: From  - home             Planning for discharge in 1-2 days   Procedures:   No admission procedures for hospital encounter.   Antimicrobials:  Anti-infectives (From admission, onward)    Start     Dose/Rate Route Frequency Ordered Stop   12/26/23 1600  cefTRIAXone  (ROCEPHIN ) 1 g in sodium chloride  0.9 % 100 mL IVPB        1 g 200 mL/hr over 30 Minutes Intravenous Every 24 hours 12/25/23 1927     12/25/23 1600  cefTRIAXone  (ROCEPHIN ) 1 g in sodium chloride  0.9 % 100 mL IVPB        1 g 200 mL/hr over 30 Minutes Intravenous  Once 12/25/23 1553 12/25/23 1730        Medication:   atorvastatin   20 mg Oral q morning   Chlorhexidine  Gluconate Cloth  6 each Topical Daily   feeding supplement  237 mL Oral BID BM   finasteride   5 mg Oral Daily   levothyroxine   100 mcg Oral Q0600   melatonin  5 mg Oral QHS   [START ON 12/28/2023] metoprolol  tartrate  25 mg Oral BID   pantoprazole   40 mg Oral Daily   tamsulosin   0.4 mg Oral BID   Tdap  0.5 mL Intramuscular Once    acetaminophen  **OR** acetaminophen , HYDROmorphone  (DILAUDID ) injection, ondansetron  **OR** ondansetron  (ZOFRAN ) IV   Objective:   Vitals:   12/26/23 1911 12/27/23 0438 12/27/23 0519 12/27/23 1056  BP: 130/78 (!) 95/45 (!) 96/51 (!) 106/59  Pulse: 79    (!) 52  Resp: 16 10 12 18   Temp: 98.4 F (36.9 C) 98.2 F (36.8 C) 98 F (36.7 C) (!) 97.4 F (36.3 C)  TempSrc: Oral Oral Oral Oral  SpO2: 100% 98% 96% 100%  Weight:      Height:        Intake/Output Summary (Last 24 hours) at 12/27/2023 1109 Last data filed at 12/27/2023 0600 Gross per 24 hour  Intake 1546.08 ml  Output 1050 ml  Net 496.08 ml   Filed Weights   12/25/23 1244 12/25/23 2115  Weight: 68 kg 59.3 kg     Physical examination:        General:  AAO x 3, drowsy, somnolent, but arousable  HEENT:  Normocephalic, PERRL, otherwise with in Normal limits   Neuro:  CNII-XII intact. , normal motor and sensation, reflexes intact  Lungs:   Clear to auscultation BL, Respirations unlabored,  No wheezes / crackles  Cardio:    S1/S2, RRR, No murmure, No Rubs or Gallops   Abdomen:  Soft, non-tender, bowel sounds active all four quadrants, no guarding or peritoneal signs.  Muscular  skeletal:  Limited exam -global generalized weaknesses - in bed, able to move all 4 extremities,   2+ pulses,  symmetric, No pitting edema  Skin:  Dry, warm to touch, negative for any Rashes,  Foley catheter in place  wounds: None visible         ------------------------------------------------------------------------------------------------------------------------------------------    LABs:     Latest Ref Rng & Units 12/27/2023    6:51 AM 12/27/2023    4:23 AM 12/26/2023    4:56 AM  CBC  WBC 4.0 - 10.5 K/uL  12.5  11.1   Hemoglobin 13.0 - 17.0 g/dL 6.9  6.9  7.6   Hematocrit 39.0 - 52.0 % 20.6  20.4  22.1   Platelets 150 - 400 K/uL  239  239       Latest Ref Rng & Units 12/27/2023    4:23 AM 12/26/2023    4:56 AM 12/25/2023    7:41 PM  CMP  Glucose 70 - 99 mg/dL 891  897  899   BUN 8 - 23 mg/dL 26  38  43   Creatinine 0.61 - 1.24 mg/dL 8.58  7.98  7.59   Sodium 135 - 145 mmol/L 135  134  131   Potassium 3.5 - 5.1 mmol/L 4.1  4.6  4.3   Chloride 98 - 111 mmol/L 106  104  100    CO2 22 - 32 mmol/L 24  23  17    Calcium  8.9 - 10.3 mg/dL 8.1  8.5  8.5   Total Protein 6.5 - 8.1 g/dL 5.1     Total Bilirubin 0.0 - 1.2 mg/dL 0.8     Alkaline Phos 38 - 126 U/L 51     AST 15 - 41 U/L 23     ALT 0 - 44 U/L 14          Micro Results Recent Results (from the past 240 hours)  Urine Culture     Status: None   Collection Time: 12/25/23  1:49 PM   Specimen: Urine, Clean Catch  Result Value Ref Range Status   Specimen Description   Final    URINE, CLEAN CATCH Performed at [facility], [address], [place], [place] [address]    Special Requests   Final    NONE Performed at [facility], [address] [place], [place] [address]    Culture   Final    NO GROWTH Performed at [facility], [address] [place], [place] [address]    Report Status 12/26/2023 FINAL  Final    Radiology Reports No results found.   SIGNED: Adriana DELENA Grams, MD, FHM. FAAFP. Jolynn Pack - [facility] Time spent - 55 min.  In seeing, evaluating and examining the patient. Reviewing medical records, labs, drawn plan of care. [facility],  Pager (please use [email or web address] to page/ text) Please use Epic Secure Chat for non-urgent communication (7AM-7PM)  If 7PM-7AM, please contact night-coverage [email or web address], 12/27/2023, 11:09 AM

## 2023-12-27 NOTE — Progress Notes (Signed)
 Patient had just finished receiving 1U of PRBC's and vitals were checked. BP low at 94/48 so manual was obtained and was 80/32. Rectal temp increasing at 96.3. Patient will wake and is oriented but drowsy. Rapid response called and Dr. Willette to bedside. Orders obtained for LR 500 bolus as well as 5mg  dose of midodrine . Bolus started and Midodrine  given prior to transfer to ICU. Patient was transported by this RN and Rapid RN. Son at bedside and aware of patient transfer.

## 2023-12-27 NOTE — Progress Notes (Signed)
 Blanket warmer placed with assistance of Facilities manager. Rectal temp reading 95.2 at time of initiation.

## 2023-12-27 NOTE — Plan of Care (Signed)
 This patient remains on [facility]U as of time of writing. The patient is AA+Ox4. No supplemental O2 requirement. No active infusions. The patient is s/p blood transfusion today with f/u H+H scheduled for tonight for phlebotomy to draw. The patient has a foley catheter in place which is draining yellow urine with scattered clots. Adult oral care protocol initiated overnight. Fall prevention measures initiated.    Problem: Education: Goal: Knowledge of General Education information will improve Description: Including pain rating scale, medication(s)/side effects and non-pharmacologic comfort measures Outcome: Progressing   Problem: Health Behavior/Discharge Planning: Goal: Ability to manage health-related needs will improve Outcome: Progressing   Problem: Clinical Measurements: Goal: Ability to maintain clinical measurements within normal limits will improve Outcome: Progressing Goal: Will remain free from infection Outcome: Progressing Goal: Diagnostic test results will improve Outcome: Progressing Goal: Respiratory complications will improve Outcome: Progressing Goal: Cardiovascular complication will be avoided Outcome: Progressing   Problem: Activity: Goal: Risk for activity intolerance will decrease Outcome: Progressing   Problem: Nutrition: Goal: Adequate nutrition will be maintained Outcome: Progressing   Problem: Coping: Goal: Level of anxiety will decrease Outcome: Progressing   Problem: Elimination: Goal: Will not experience complications related to bowel motility Outcome: Progressing Goal: Will not experience complications related to urinary retention Outcome: Progressing   Problem: Pain Managment: Goal: General experience of comfort will improve and/or be controlled Outcome: Progressing   Problem: Safety: Goal: Ability to remain free from injury will improve Outcome: Progressing   Problem: Skin Integrity: Goal: Risk for impaired skin integrity will  decrease Outcome: Progressing

## 2023-12-27 NOTE — Progress Notes (Signed)
   12/27/23 0744  Provider Notification  Provider Name/Title Dr. Willette  Date Provider Notified 12/27/23  Time Provider Notified [phone]  Method of Notification Page  Notification Reason Critical Result  Test performed and critical result HGB-6.9  Date Critical Result Received 12/27/23  Time Critical Result Received 0745  Provider response See new orders  Date of Provider Response 12/27/23  Time of Provider Response [phone]

## 2023-12-27 NOTE — Consult Note (Signed)
 Urology Consult  Referring physician: Dr. Barbra Reason for referral: urinary retention  Chief Complaint: difficulty urinating  History of Present Illness: Clayton Austin is a 83yo with a history of urethral stricture disease, BPH, bladder calculi who presented to the ER after a fall and was found to be in urinary retention. He is a poor historian but he notes new onset urinary urgency, frequency, dysuira for 1-2 days. He has not passed any bladder calculi recently. He previously underwent cystolithalopaxy in 2023 and was found to have a large urethral diverticulum filled with stone. Multiple attempts were made to place a foley which were unsuccessful. He was having hematuria this morning.   Past Medical History:  Diagnosis Date   Benign prostatic hypertrophy    BPH (benign prostatic hypertrophy)    Coronary artery disease cardiologist-  dr charls   a. NSTEMI 07/2012 => critical LM disease on LHC => s/p CABG (L-LAD/D1, S-OM2, S-PDA/PL);  EF 55-60% at [facility] 07/2012   Exercise tolerance test normal    Dec 2014  per dr charls (cardiologist) note [facility] treadmilll score 5.5 (low risk)   GERD (gastroesophageal reflux disease)    History of atrial fibrillation    post-op  CABG 04/ 2014   History of non-ST elevation myocardial infarction (NSTEMI)    04/ 2014   Hyperlipidemia    Hypertension    Hypothyroidism    Ischemic heart disease    STABLE PER DR CHARLS (CARDIOLOGIST) NOTE 06-18-2015   Lower urinary tract symptoms (LUTS)    RBBB (right bundle branch block)    S/P CABG x 5    08-17-2012   LIMA sequentilly to LAD and Diagonal,  SVG to OM,  SVG seq to PDA and PLB with EVH from right thigh   Urethral stricture    recurrent   Wears dentures    UPPER   Wears glasses    Past Surgical History:  Procedure Laterality Date   CORONARY ARTERY BYPASS GRAFT N/A 08/17/2012   Procedure: CORONARY ARTERY BYPASS GRAFTING (CABG);  Surgeon: Elspeth JAYSON Millers, MD;  Location: [facility] OR;  Service: Open  Heart Surgery;  Laterality: N/A;  L-LAD/D1, S-OM2,  S-PDA/PL   CYSTOSCOPY N/A 08/17/2012   Procedure: CYSTOSCOPY FLEXIBLE for difficult  foley catheter insertion;  Surgeon: Ricardo Likens, MD;  Location: [facility] OR;  Service: Urology;  Laterality: N/A;   CYSTOSCOPY N/A 01/16/2016   Procedure: CYSTOSCOPY FLEXIBLE;  Surgeon: Ricardo Likens, MD;  Location: [facility];  Service: Urology;  Laterality: N/A;   CYSTOSCOPY WITH LITHOLAPAXY N/A 10/31/2021   Procedure: CYSTOSCOPY WITH LITHOLAPAXY;  Surgeon: Sherrilee Belvie CROME, MD;  Location: [facility];  Service: Urology;  Laterality: N/A;   CYSTOSCOPY WITH URETHRAL DILATATION N/A 01/16/2016   Procedure: CYSTOSCOPY WITH URETHRAL BALLOON DILATATION;  Surgeon: Ricardo Likens, MD;  Location: [facility];  Service: Urology;  Laterality: N/A;   LEFT HEART CATHETERIZATION WITH CORONARY ANGIOGRAM N/A 08/16/2012   Procedure: LEFT HEART CATHETERIZATION WITH CORONARY ANGIOGRAM;  Surgeon: Ozell Fell, MD;  Location: [facility] CATH LAB;  Service: Cardiovascular;  Laterality: N/A;  NSTEMI;  critical LM stenosis extending into the ostial LAD & LCFX,  moderately tight RCA stenosis,  Normal LVF, ef 55-60%   TRANSURETHRAL RESECTION OF PROSTATE  06/2012    Medications: I have reviewed the patient's current medications. Allergies: No Known Allergies  Family History  Problem Relation Age of Onset   Coronary artery disease Father 36   Social History:  reports that he quit smoking about 77  years ago. His smoking use included cigarettes. He started smoking about 63 years ago. He has a 5 pack-year smoking history. He has never used smokeless tobacco. He reports that he does not drink alcohol  and does not use drugs.  Review of Systems  Genitourinary:  Positive for difficulty urinating and hematuria.  All other systems reviewed and are negative.   Physical Exam:  Vital signs in last 24 hours: Temp:  [97.9 F (36.6 C)-98.4 F (36.9 C)] 98 F (36.7 C) (09/07  0519) Pulse Rate:  [75-79] 79 (09/06 1911) Resp:  [10-17] 12 (09/07 0519) BP: (95-130)/(45-78) 96/51 (09/07 0519) SpO2:  [96 %-100 %] 96 % (09/07 0519) Physical Exam Vitals and nursing note reviewed.  Constitutional:      Appearance: Normal appearance.  HENT:     Head: Normocephalic and atraumatic.     Nose: Nose normal.     Mouth/Throat:     Mouth: Mucous membranes are dry.  Eyes:     Extraocular Movements: Extraocular movements intact.     Pupils: Pupils are equal, round, and reactive to light.  Cardiovascular:     Rate and Rhythm: Normal rate and regular rhythm.  Pulmonary:     Effort: Pulmonary effort is normal. No respiratory distress.  Abdominal:     General: Abdomen is flat. There is distension.  Genitourinary:    Penis: Normal.      Testes: Normal.  Musculoskeletal:        General: No swelling. Normal range of motion.     Cervical back: Normal range of motion and neck supple.  Skin:    General: Skin is warm and dry.  Neurological:     General: No focal deficit present.     Mental Status: He is alert. He is disoriented.     Laboratory Data:  Results for orders placed or performed during the hospital encounter of 12/25/23 (from the past 72 hours)  Urinalysis, Routine w reflex microscopic -Urine, Catheterized     Status: Abnormal   Collection Time: 12/25/23  1:49 PM  Result Value Ref Range   Color, Urine RED (A) YELLOW    Comment: BIOCHEMICALS MAY BE AFFECTED BY COLOR   APPearance CLOUDY (A) CLEAR   Specific Gravity, Urine 1.016 1.005 - 1.030   pH 6.0 5.0 - 8.0   Glucose, UA NEGATIVE NEGATIVE mg/dL   Hgb urine dipstick MODERATE (A) NEGATIVE   Bilirubin Urine NEGATIVE NEGATIVE   Ketones, ur NEGATIVE NEGATIVE mg/dL   Protein, ur 899 (A) NEGATIVE mg/dL   Nitrite NEGATIVE NEGATIVE   Leukocytes,Ua NEGATIVE NEGATIVE   RBC / HPF >50 0 - 5 RBC/hpf   WBC, UA >50 0 - 5 WBC/hpf   Bacteria, UA NONE SEEN NONE SEEN   Squamous Epithelial / HPF 0-5 0 - 5 /HPF   WBC  Clumps PRESENT     Comment: Performed at [facility], [address] [place], [place] [address]  Urine Culture     Status: None   Collection Time: 12/25/23  1:49 PM   Specimen: Urine, Clean Catch  Result Value Ref Range   Specimen Description      URINE, CLEAN CATCH Performed at [facility], [address] [place], [place] [address]    Special Requests      NONE Performed at [facility], [address], [place], [place] [address]    Culture      NO GROWTH Performed at [facility], [address] [place], [place] [address]  Report Status 12/26/2023 FINAL   Comprehensive metabolic panel     Status: Abnormal   Collection Time: 12/25/23  3:53 PM  Result Value Ref Range   Sodium 132 (L) 135 - 145 mmol/L   Potassium 4.8 3.5 - 5.1 mmol/L   Chloride 98 98 - 111 mmol/L   CO2 17 (L) 22 - 32 mmol/L   Glucose, Bld 113 (H) 70 - 99 mg/dL    Comment: Glucose reference range applies only to samples taken after fasting for at least 8 hours.   BUN 48 (H) 8 - 23 mg/dL   Creatinine, Ser 7.17 (H) 0.61 - 1.24 mg/dL   Calcium  9.0 8.9 - 10.3 mg/dL   Total Protein 6.7 6.5 - 8.1 g/dL   Albumin  3.4 (L) 3.5 - 5.0 g/dL   AST 36 15 - 41 U/L   ALT 16 0 - 44 U/L   Alkaline Phosphatase 61 38 - 126 U/L   Total Bilirubin 0.9 0.0 - 1.2 mg/dL   GFR, Estimated 22 (L) >60 mL/min    Comment: (NOTE) Calculated using the CKD-EPI Creatinine Equation (2021)    Anion gap 17 (H) 5 - 15    Comment: Performed at [facility], [address], [place], [place] [address]  Lactic acid, plasma     Status: Abnormal   Collection Time: 12/25/23  3:53 PM  Result Value Ref Range   Lactic Acid, Venous 4.7 (HH) 0.5 - 1.9 mmol/L    Comment: CRITICAL RESULT CALLED TO, READ BACK BY AND VERIFIED WITH JORCE A @ 1628 ON [id number] BY HENDERSON L Performed at [facility], [address], [place], [place] [address]   Troponin I (High Sensitivity)     Status: Abnormal   Collection Time: 12/25/23  3:53 PM  Result Value Ref  Range   Troponin I (High Sensitivity) 87 (H) <18 ng/L    Comment: (NOTE) Elevated high sensitivity troponin I (hsTnI) values and significant  changes across serial measurements may suggest ACS but many other  chronic and acute conditions are known to elevate hsTnI results.  Refer to the Links section for chest pain algorithms and additional  guidance. Performed at [facility], [address], [place], [place] [address]   CBC with Differential     Status: Abnormal   Collection Time: 12/25/23  3:53 PM  Result Value Ref Range   WBC 12.0 (H) 4.0 - 10.5 K/uL   RBC 2.00 (L) 4.22 - 5.81 MIL/uL   Hemoglobin 6.1 (LL) 13.0 - 17.0 g/dL    Comment: REPEATED TO VERIFY This critical result has been called to C. BERRIER by Earnie Sor on 12/25/2023 16:16:15, and has been read back. CALLED BY T. HAMER.    HCT 19.9 (L) 39.0 - 52.0 %   MCV 99.5 80.0 - 100.0 fL   MCH 30.5 26.0 - 34.0 pg   MCHC 30.7 30.0 - 36.0 g/dL   RDW 86.5 88.4 - 84.4 %   Platelets 268 150 - 400 K/uL   nRBC 0.0 0.0 - 0.2 %   Neutrophils Relative % 80 %   Neutro Abs 9.6 (H) 1.7 - 7.7 K/uL   Lymphocytes Relative 11 %   Lymphs Abs 1.3 0.7 - 4.0 K/uL   Monocytes Relative 9 %   Monocytes Absolute 1.1 (H) 0.1 - 1.0 K/uL   Eosinophils Relative 0 %   Eosinophils Absolute 0.0 0.0 - 0.5 K/uL   Basophils Relative 0 %   Basophils Absolute 0.0 0.0 - 0.1 K/uL  Immature Granulocytes 0 %   Abs Immature Granulocytes 0.04 0.00 - 0.07 K/uL    Comment: Performed at [facility], [address] [place], [place] [address]  Magnesium      Status: None   Collection Time: 12/25/23  3:53 PM  Result Value Ref Range   Magnesium  2.1 1.7 - 2.4 mg/dL    Comment: Performed at [facility], [address], [place], [place] [address]  POC occult blood, ED     Status: Abnormal   Collection Time: 12/25/23  4:34 PM  Result Value Ref Range   Negative NEG    Negative    Lactic acid, plasma     Status: Abnormal   Collection Time: 12/25/23  5:16 PM   Result Value Ref Range   Lactic Acid, Venous 4.0 (HH) 0.5 - 1.9 mmol/L    Comment: CRITICAL VALUE NOTED. VALUE IS CONSISTENT WITH PREVIOUSLY REPORTED/CALLED VALUE Performed at [facility], [address] [place], [place] [address]   Troponin I (High Sensitivity)     Status: Abnormal   Collection Time: 12/25/23  5:16 PM  Result Value Ref Range   Troponin I (High Sensitivity) 96 (H) <18 ng/L    Comment: (NOTE) Elevated high sensitivity troponin I (hsTnI) values and significant  changes across serial measurements may suggest ACS but many other  chronic and acute conditions are known to elevate hsTnI results.  Refer to the Links section for chest pain algorithms and additional  guidance. Performed at [facility], [address], [place], [place] [address]   Type and screen [facility]     Status: None (Preliminary result)   Collection Time: 12/25/23  5:16 PM  Result Value Ref Range   ABO/RH(D) B POS    Antibody Screen NEG    Sample Expiration 12/28/2023,2359    Unit Number [id number]    Blood Component Type RED CELLS,LR    Unit division 00    Status of Unit ISSUED,FINAL    Transfusion Status OK TO TRANSFUSE    Crossmatch Result Compatible    Unit Number [id number]    Blood Component Type RED CELLS,LR    Unit division 00    Status of Unit ALLOCATED    Transfusion Status OK TO TRANSFUSE    Crossmatch Result      Compatible Performed at [facility], [address], [place], [place] [address]    Unit Number [id number]    Blood Component Type RED CELLS,LR    Unit division 00    Status of Unit ALLOCATED    Transfusion Status OK TO TRANSFUSE    Crossmatch Result Compatible   Prepare RBC (crossmatch)     Status: None   Collection Time: 12/25/23  5:16 PM  Result Value Ref Range   Order Confirmation      ORDER PROCESSED BY BLOOD BANK Performed at [facility], [address], [place], [place] [address]   Lactic acid, plasma     Status: Abnormal   Collection  Time: 12/25/23  7:41 PM  Result Value Ref Range   Lactic Acid, Venous 2.3 (HH) 0.5 - 1.9 mmol/L    Comment: CRITICAL RESULT CALLED TO, READ BACK BY AND VERIFIED WITH NICHOLS,K ON 12/25/23 AT 2006 BY PURDIE,J Performed at [facility], [address], [place], [place] [address]   Basic metabolic panel     Status: Abnormal   Collection Time: 12/25/23  7:41 PM  Result Value Ref Range   Sodium 131 (L) 135 - 145 mmol/L  Potassium 4.3 3.5 - 5.1 mmol/L   Chloride 100 98 - 111 mmol/L   CO2 17 (L) 22 - 32 mmol/L   Glucose, Bld 100 (H) 70 - 99 mg/dL    Comment: Glucose reference range applies only to samples taken after fasting for at least 8 hours.   BUN 43 (H) 8 - 23 mg/dL   Creatinine, Ser 7.59 (H) 0.61 - 1.24 mg/dL   Calcium  8.5 (L) 8.9 - 10.3 mg/dL   GFR, Estimated 26 (L) >60 mL/min    Comment: (NOTE) Calculated using the CKD-EPI Creatinine Equation (2021)    Anion gap 14 5 - 15    Comment: Performed at [facility], [address], [place], [place] [address]  Hemoglobin and hematocrit, blood     Status: Abnormal   Collection Time: 12/26/23 12:36 AM  Result Value Ref Range   Hemoglobin 8.2 (L) 13.0 - 17.0 g/dL    Comment: REPEATED TO VERIFY POST TRANSFUSION SPECIMEN    HCT 24.1 (L) 39.0 - 52.0 %    Comment: Performed at [facility], [address] [place], [place] [address]  Lactic acid, plasma     Status: None   Collection Time: 12/26/23 12:36 AM  Result Value Ref Range   Lactic Acid, Venous 1.6 0.5 - 1.9 mmol/L    Comment: Performed at [facility], [address], [place], [place] [address]  CBC     Status: Abnormal   Collection Time: 12/26/23  4:56 AM  Result Value Ref Range   WBC 11.1 (H) 4.0 - 10.5 K/uL   RBC 2.40 (L) 4.22 - 5.81 MIL/uL   Hemoglobin 7.6 (L) 13.0 - 17.0 g/dL   HCT 77.8 (L) 60.9 - 47.9 %   MCV 92.1 80.0 - 100.0 fL   MCH 31.7 26.0 - 34.0 pg   MCHC 34.4 30.0 - 36.0 g/dL   RDW 86.1 88.4 - 84.4 %   Platelets 239 150 - 400 K/uL   nRBC 0.0 0.0 - 0.2 %     Comment: Performed at [facility], [address] [place], [place] [address]  Basic metabolic panel     Status: Abnormal   Collection Time: 12/26/23  4:56 AM  Result Value Ref Range   Sodium 134 (L) 135 - 145 mmol/L   Potassium 4.6 3.5 - 5.1 mmol/L   Chloride 104 98 - 111 mmol/L   CO2 23 22 - 32 mmol/L   Glucose, Bld 102 (H) 70 - 99 mg/dL    Comment: Glucose reference range applies only to samples taken after fasting for at least 8 hours.   BUN 38 (H) 8 - 23 mg/dL   Creatinine, Ser 7.98 (H) 0.61 - 1.24 mg/dL   Calcium  8.5 (L) 8.9 - 10.3 mg/dL   GFR, Estimated 32 (L) >60 mL/min    Comment: (NOTE) Calculated using the CKD-EPI Creatinine Equation (2021)    Anion gap 7 5 - 15    Comment: Performed at [facility], [address] [place], [place] [address]  CBC     Status: Abnormal   Collection Time: 12/27/23  4:23 AM  Result Value Ref Range   WBC 12.5 (H) 4.0 - 10.5 K/uL   RBC 2.15 (L) 4.22 - 5.81 MIL/uL   Hemoglobin 6.9 (LL) 13.0 - 17.0 g/dL    Comment: REPEATED TO VERIFY This critical result has been called to [facility] @ 0530 ON 12/27/23 by [place] on 12/27/2023 05:29:39, and has been read back.    HCT 20.4 (L)  39.0 - 52.0 %   MCV 94.9 80.0 - 100.0 fL   MCH 32.1 26.0 - 34.0 pg   MCHC 33.8 30.0 - 36.0 g/dL   RDW 85.8 88.4 - 84.4 %   Platelets 239 150 - 400 K/uL   nRBC 0.0 0.0 - 0.2 %    Comment: Performed at [facility], [address] [place], [place] [address]  Comprehensive metabolic panel     Status: Abnormal   Collection Time: 12/27/23  4:23 AM  Result Value Ref Range   Sodium 135 135 - 145 mmol/L   Potassium 4.1 3.5 - 5.1 mmol/L   Chloride 106 98 - 111 mmol/L   CO2 24 22 - 32 mmol/L   Glucose, Bld 108 (H) 70 - 99 mg/dL    Comment: Glucose reference range applies only to samples taken after fasting for at least 8 hours.   BUN 26 (H) 8 - 23 mg/dL   Creatinine, Ser 8.58 (H) 0.61 - 1.24 mg/dL   Calcium  8.1 (L) 8.9 - 10.3 mg/dL   Total Protein 5.1 (L) 6.5 - 8.1  g/dL   Albumin  2.4 (L) 3.5 - 5.0 g/dL   AST 23 15 - 41 U/L   ALT 14 0 - 44 U/L   Alkaline Phosphatase 51 38 - 126 U/L   Total Bilirubin 0.8 0.0 - 1.2 mg/dL   GFR, Estimated 49 (L) >60 mL/min    Comment: (NOTE) Calculated using the CKD-EPI Creatinine Equation (2021)    Anion gap 5 5 - 15    Comment: Performed at [facility], [address], [place], [place] [address]  Hemoglobin and hematocrit, blood     Status: Abnormal   Collection Time: 12/27/23  6:51 AM  Result Value Ref Range   Hemoglobin 6.9 (LL) 13.0 - 17.0 g/dL    Comment: REPEATED TO VERIFY This critical result has been called to [facility] by Rutherford Sauers on 12/27/2023 07:40:19, and has been read back.    HCT 20.6 (L) 39.0 - 52.0 %    Comment: Performed at [facility], [address], [place], [place] [address]  Prepare RBC (crossmatch)     Status: None   Collection Time: 12/27/23  7:46 AM  Result Value Ref Range   Order Confirmation      ORDER PROCESSED BY BLOOD BANK Performed at [facility], [address], [place], [place] [address]    Recent Results (from the past 240 hours)  Urine Culture     Status: None   Collection Time: 12/25/23  1:49 PM   Specimen: Urine, Clean Catch  Result Value Ref Range Status   Specimen Description   Final    URINE, CLEAN CATCH Performed at [facility], [address], [place], [place] [address]    Special Requests   Final    NONE Performed at [facility], [address], [place], [place] [address]    Culture   Final    NO GROWTH Performed at [facility], [address] [place], [place] [address]    Report Status 12/26/2023 FINAL  Final   Creatinine: Recent Labs    12/25/23 1553 12/25/23 1941 12/26/23 0456 12/27/23 0423  CREATININE 2.82* 2.40* 2.01* 1.41*   Baseline Creatinine: 1.2      Cystoscopy Procedure Note  Patient identification was confirmed, informed consent was obtained, and patient was prepped using Betadine solution.  Lidocaine  jelly was  administered per urethral meatus.     Pre-Procedure: - Inspection reveals a normal caliber  ureteral meatus.  Procedure: The flexible cystoscope was introduced without difficulty - large posterior mid urethral diverticulum. Numerous small calculi obstructing bulbar urethra which were pushed back into the bladder.  - Enlarged prostate  - Elevated bladder neck - Bilateral ureteral orifices identified - Bladder mucosa  reveals no ulcers, tumors, or lesions - numerous bladder stones - moderate trabeculation  Sensor wire passed into the bladder. The cystoscope was removed and a 16 french counsel catheter was placed over the wire. The wire was removed a 1L of bloody urine drained   Post-Procedure: - Patient tolerated the procedure well   Impression/Assessment:  83yo with urinary retention  Plan:  16 french foley successfully placed and 1L of dark bloody urine drained. This patients foley cannot be removed due to requiring cystoscopy to place another foley. Any issues with the foley please contact urology. I discussed cystolithalopaxy with the patient and wife and they agreed to proceed with surgery after discharge.   Belvie Clara 12/27/2023, 9:10 AM

## 2023-12-27 NOTE — Progress Notes (Signed)
 Patient has HGB of 6.9 reported to [place].

## 2023-12-28 ENCOUNTER — Other Ambulatory Visit (HOSPITAL_COMMUNITY): Payer: Self-pay

## 2023-12-28 DIAGNOSIS — R6520 Severe sepsis without septic shock: Secondary | ICD-10-CM

## 2023-12-28 DIAGNOSIS — D649 Anemia, unspecified: Secondary | ICD-10-CM

## 2023-12-28 DIAGNOSIS — A419 Sepsis, unspecified organism: Secondary | ICD-10-CM | POA: Diagnosis not present

## 2023-12-28 DIAGNOSIS — I959 Hypotension, unspecified: Secondary | ICD-10-CM | POA: Diagnosis not present

## 2023-12-28 LAB — BPAM RBC
Blood Product Expiration Date: 202509232359
Blood Product Expiration Date: 202510042359
Blood Product Expiration Date: 202510042359
ISSUE DATE / TIME: 202509051933
ISSUE DATE / TIME: 202509071112
ISSUE DATE / TIME: 202509071618
Unit Type and Rh: 5100
Unit Type and Rh: 5100
Unit Type and Rh: 7300

## 2023-12-28 LAB — COMPREHENSIVE METABOLIC PANEL WITH GFR
ALT: 14 U/L (ref 0–44)
AST: 19 U/L (ref 15–41)
Albumin: 2.4 g/dL — ABNORMAL LOW (ref 3.5–5.0)
Alkaline Phosphatase: 56 U/L (ref 38–126)
Anion gap: 2 — ABNORMAL LOW (ref 5–15)
BUN: 25 mg/dL — ABNORMAL HIGH (ref 8–23)
CO2: 24 mmol/L (ref 22–32)
Calcium: 8 mg/dL — ABNORMAL LOW (ref 8.9–10.3)
Chloride: 108 mmol/L (ref 98–111)
Creatinine, Ser: 1.26 mg/dL — ABNORMAL HIGH (ref 0.61–1.24)
GFR, Estimated: 57 mL/min — ABNORMAL LOW (ref >60)
Glucose, Bld: 86 mg/dL (ref 70–99)
Potassium: 4 mmol/L (ref 3.5–5.1)
Sodium: 134 mmol/L — ABNORMAL LOW (ref 135–145)
Total Bilirubin: 0.8 mg/dL (ref 0.0–1.2)
Total Protein: 5.3 g/dL — ABNORMAL LOW (ref 6.5–8.1)

## 2023-12-28 LAB — TYPE AND SCREEN
ABO/RH(D): B POS
Antibody Screen: NEGATIVE
Unit division: 0
Unit division: 0
Unit division: 0

## 2023-12-28 LAB — CBC
HCT: 29 % — ABNORMAL LOW (ref 39.0–52.0)
Hemoglobin: 9.9 g/dL — ABNORMAL LOW (ref 13.0–17.0)
MCH: 31 pg (ref 26.0–34.0)
MCHC: 34.1 g/dL (ref 30.0–36.0)
MCV: 90.9 fL (ref 80.0–100.0)
Platelets: 250 K/uL (ref 150–400)
RBC: 3.19 MIL/uL — ABNORMAL LOW (ref 4.22–5.81)
RDW: 15.1 % (ref 11.5–15.5)
WBC: 11.5 K/uL — ABNORMAL HIGH (ref 4.0–10.5)
nRBC: 0 % (ref 0.0–0.2)

## 2023-12-28 MED ORDER — METOPROLOL TARTRATE 25 MG PO TABS
12.5000 mg | ORAL_TABLET | Freq: Two times a day (BID) | ORAL | Status: DC
  Administered 2023-12-28 – 2023-12-31 (×7): 12.5 mg via ORAL
  Filled 2023-12-28 (×7): qty 1

## 2023-12-28 MED ORDER — MIDODRINE HCL 5 MG PO TABS
2.5000 mg | ORAL_TABLET | Freq: Three times a day (TID) | ORAL | Status: DC
  Administered 2023-12-28 – 2023-12-29 (×4): 2.5 mg via ORAL
  Filled 2023-12-28 (×4): qty 1

## 2023-12-28 NOTE — Progress Notes (Signed)
 PROGRESS NOTE    Patient: Clayton Austin                            PCP: Fanta, Tesfaye Demissie, MD                    DOB: 06-Oct-1940            DOA: 12/25/2023 FMW:[id number]             DOS: 12/28/2023, 12:31 PM   LOS: 3 days   Date of Service: The patient was seen and examined on 12/28/2023  Subjective:   The patient was seen and examined this morning, stable no acute distress Mildly hypotensive BP 102/46 otherwise hemodynamically stable, satting 100% on room air  S/p 2U PRBC-tolerated well, hemoglobin improved to 10.2 today Foley catheter in place still noticing serosanguineous reddish hue urine in bag  Brief Narrative:   Clayton Austin is a 83 y.o. male with medical history significant of hypertension, hypothyroidism, hyperlipidemia, coronary artery disease with history of CABG x 5 vessels in and NSTEMI in 2014, paroxysmal atrial fibrillation on anticoagulation.  Patient has been having difficulty urinating for the past 3 to 4 days.   Around the same time, he has noticed some hematuria.  Per his recollection, this has never happened before. He came to the hospital he came to the hospital due to suprapubic pain and difficulty urinating.  No fevers or chills although last night he got weak and fell, striking his head. Due to inability to urinate, urology came and placed a Foley catheter.   Per EDP 7 ureteral stones were present which were pushed back into the bladder.  [name] was noted.    Urology, Dr. Sherrilee,     Assessment & Plan:   Principal Problem:   Sepsis [facility]) Active Problems:   CAD (coronary artery disease) of artery bypass graft   Hypothyroidism   Benign prostatic hyperplasia with weak urinary stream   Benign essential HTN   Bladder stone   Paroxysmal atrial fibrillation (HCC)   Atrial fibrillation with RVR (HCC)     Urosepsis sepsis (HCC) POA: Met sepsis criteria due to UTI, nephrolithiasis  -During day 12/27/23 -patient was found hypothermic,  mildly hypotensive-transferred to stepdown unit for close observation - Status post 2U PRBC transfusion, and bolus LR- -currently hemodynamically stable, mildly hypotensive with a BP of 102/46, temp 97.6, satting 100% - Overall improving SIRS physiology  -Continue current antibiotics   -Hypertensive otherwise hemodynamically stable this morning  -Status post Foley catheter placement-noted light bloody urine  -Will monitor closely -Will follow-up with cultures   Hematuria with ureteral obstruction due to ureteral stone - Due to UTI ureter stones -Foley catheter in place -Monitoring closely, continue IVF IV antibiotics   Acute on chronic anemia exacerbated by hematuria -POA hemoglobin 6.1>> 7.6, 6.9>>> 9.89  -Monitoring H&H - Holding anticoagulation - S/p 1U PRBC transfusion on this admission - 12/27/2023, pursuing with 2U PRBC blood transfusion     Latest Ref Rng & Units 12/28/2023    4:26 AM 12/27/2023    8:44 PM 12/27/2023    6:51 AM  CBC  WBC 4.0 - 10.5 K/uL 11.5     Hemoglobin 13.0 - 17.0 g/dL 9.9  89.7  6.9   Hematocrit 39.0 - 52.0 % 29.0  30.3  20.6   Platelets 150 - 400 K/uL 250       Acute renal insufficiency  -  Ruling out CKD-improving BUN/creatinine - Exacerbated by nephrolithiasis, UTI - Foley catheter in place -Monitor kidney function closely - Avoid nephrotoxins, hypotension Lab Results  Component Value Date   CREATININE 1.26 (H) 12/28/2023   CREATININE 1.41 (H) 12/27/2023   CREATININE 2.01 (H) 12/26/2023     CAD (coronary artery disease) of artery bypass graft - Continue home medication, holding anticoagulation  Hypothyroidism - Continue Synthroid    Benign prostatic hyperplasia with weak urinary stream -Monitoring, Foley catheter currently in place -Continue finasteride ,  -Follow-up with urologist  Benign essential HTN - hypotensive - BP soft, holding Norvasc , losartan    Bladder stone -management as above Foley catheter in place   Paroxysmal  atrial fibrillation (HCC) -holding Eliquis, continue metoprolol        -------------------------------------------------------------------------------------------------------------------------- Nutritional status:  The patient's BMI is: Body mass index is 21.88 kg/m. I agree with the assessment and plan as outlined     ---------------------------------------------------------------------------------------------------------------------- Cultures; Blood Cultures x 2 >> no growth to date Urine Culture  >>> no growth to date  ----------------------------------------------------------------------------------------------------------------------------  DVT prophylaxis:  SCDs Start: 12/25/23 1926   Code Status:   Code Status: Full Code  Family Communication: Son present at bedside-updated -Advance care planning has been discussed.   Admission status:   Status is: Inpatient Remains inpatient appropriate because: Needing treatment for sepsis, IV fluid, IV antibiotics, Foley catheter   Disposition: From  - home             Planning for discharge in 1-2 days to SNF  Procedures:   No admission procedures for hospital encounter.   Antimicrobials:  Anti-infectives (From admission, onward)    Start     Dose/Rate Route Frequency Ordered Stop   12/26/23 1600  cefTRIAXone  (ROCEPHIN ) 1 g in sodium chloride  0.9 % 100 mL IVPB        1 g 200 mL/hr over 30 Minutes Intravenous Every 24 hours 12/25/23 1927     12/25/23 1600  cefTRIAXone  (ROCEPHIN ) 1 g in sodium chloride  0.9 % 100 mL IVPB        1 g 200 mL/hr over 30 Minutes Intravenous  Once 12/25/23 1553 12/25/23 1730        Medication:   atorvastatin   20 mg Oral q morning   Chlorhexidine  Gluconate Cloth  6 each Topical Daily   feeding supplement  237 mL Oral BID BM   finasteride   5 mg Oral Daily   levothyroxine   100 mcg Oral Q0600   melatonin  6 mg Oral QHS   metoprolol  tartrate  12.5 mg Oral BID   midodrine   2.5 mg Oral TID  WC   pantoprazole   40 mg Oral Daily   sennosides  10 mL Oral BID   tamsulosin   0.4 mg Oral BID   Tdap  0.5 mL Intramuscular Once    acetaminophen  **OR** acetaminophen , HYDROmorphone  (DILAUDID ) injection, ondansetron  **OR** ondansetron  (ZOFRAN ) IV, mouth rinse   Objective:   Vitals:   12/28/23 0900 12/28/23 0911 12/28/23 1000 12/28/23 1151  BP: (!) 115/45 (!) 115/45 (!) 102/46   Pulse: 60 71 60   Resp:   15   Temp:    97.6 F (36.4 C)  TempSrc:    Axillary  SpO2: 100%  100%   Weight:      Height:        Intake/Output Summary (Last 24 hours) at 12/28/2023 1231 Last data filed at 12/28/2023 0400 Gross per 24 hour  Intake 1393.54 ml  Output 770 ml  Net 623.54 ml  Filed Weights   12/25/23 2115 12/27/23 1508 12/28/23 0400  Weight: 59.3 kg 64.9 kg 67.2 kg     Physical examination:   General:  AAO x 3,  cooperative, no distress;   HEENT:  Normocephalic, PERRL, otherwise with in Normal limits   Neuro:  CNII-XII intact. , normal motor and sensation, reflexes intact   Lungs:   Clear to auscultation BL, Respirations unlabored,  No wheezes / crackles  Cardio:    S1/S2, RRR, No murmure, No Rubs or Gallops   Abdomen:  Soft, non-tender, bowel sounds active all four quadrants, no guarding or peritoneal signs.  Muscular  skeletal:  Limited exam -global generalized weaknesses - in bed, able to move all 4 extremities,   2+ pulses,  symmetric, No pitting edema  Skin:  Dry, warm to touch, negative for any Rashes,   Foley catheter in place  wounds: None visible ---------------------------------------------------------------------------------------------------------------------------    LABs:     Latest Ref Rng & Units 12/28/2023    4:26 AM 12/27/2023    8:44 PM 12/27/2023    6:51 AM  CBC  WBC 4.0 - 10.5 K/uL 11.5     Hemoglobin 13.0 - 17.0 g/dL 9.9  89.7  6.9   Hematocrit 39.0 - 52.0 % 29.0  30.3  20.6   Platelets 150 - 400 K/uL 250         Latest Ref Rng & Units 12/28/2023     4:26 AM 12/27/2023    4:23 AM 12/26/2023    4:56 AM  CMP  Glucose 70 - 99 mg/dL 86  891  897   BUN 8 - 23 mg/dL 25  26  38   Creatinine 0.61 - 1.24 mg/dL 8.73  8.58  7.98   Sodium 135 - 145 mmol/L 134  135  134   Potassium 3.5 - 5.1 mmol/L 4.0  4.1  4.6   Chloride 98 - 111 mmol/L 108  106  104   CO2 22 - 32 mmol/L 24  24  23    Calcium  8.9 - 10.3 mg/dL 8.0  8.1  8.5   Total Protein 6.5 - 8.1 g/dL 5.3  5.1    Total Bilirubin 0.0 - 1.2 mg/dL 0.8  0.8    Alkaline Phos 38 - 126 U/L 56  51    AST 15 - 41 U/L 19  23    ALT 0 - 44 U/L 14  14         Micro Results Recent Results (from the past 240 hours)  Urine Culture     Status: None   Collection Time: 12/25/23  1:49 PM   Specimen: Urine, Clean Catch  Result Value Ref Range Status   Specimen Description   Final    URINE, CLEAN CATCH Performed at [facility], [address], [place], [place] [address]    Special Requests   Final    NONE Performed at [facility], [address] [place], [place] [address]    Culture   Final    NO GROWTH Performed at [facility], [address] [place], [place] [address]    Report Status 12/26/2023 FINAL  Final  Culture, blood (Routine X 2) w Reflex to ID Panel     Status: None (Preliminary result)   Collection Time: 12/27/23  2:35 PM   Specimen: BLOOD  Result Value Ref Range Status   Specimen Description BLOOD BLOOD LEFT ARM  Final   Special Requests   Final    BOTTLES  DRAWN AEROBIC AND ANAEROBIC Blood Culture adequate volume   Culture   Final    NO GROWTH < 24 HOURS Performed at [facility], [address], [place], [place] [address]    Report Status PENDING  Incomplete  MRSA Next Gen by PCR, Nasal     Status: None   Collection Time: 12/27/23  3:02 PM   Specimen: Nasal Mucosa; Nasal Swab  Result Value Ref Range Status   MRSA by PCR Next Gen NOT DETECTED NOT DETECTED Final    Comment: (NOTE) The GeneXpert MRSA Assay (FDA approved for NASAL specimens only), is one component of a  comprehensive MRSA colonization surveillance program. It is not intended to diagnose MRSA infection nor to guide or monitor treatment for MRSA infections. Test performance is not FDA approved in patients less than 36 years old. Performed at [facility], [address] [place], [place] [address]   Culture, blood (Routine X 2) w Reflex to ID Panel     Status: None (Preliminary result)   Collection Time: 12/27/23  4:38 PM   Specimen: BLOOD  Result Value Ref Range Status   Specimen Description BLOOD LEFT ANTECUBITAL  Final   Special Requests   Final    BOTTLES DRAWN AEROBIC AND ANAEROBIC Blood Culture adequate volume   Culture   Final    NO GROWTH < 24 HOURS Performed at [facility], [address], [place], [place] [address]    Report Status PENDING  Incomplete    Radiology Reports No results found.   SIGNED: Adriana DELENA Grams, MD, FHM. FAAFP. Jolynn Pack - [facility] Time spent - 55 min.  In seeing, evaluating and examining the patient. Reviewing medical records, labs, drawn plan of care. [facility],  Pager (please use [email or web address] to page/ text) Please use Epic Secure Chat for non-urgent communication (7AM-7PM)  If 7PM-7AM, please contact night-coverage [email or web address], 12/28/2023, 12:31 PM

## 2023-12-28 NOTE — Evaluation (Signed)
 Physical Therapy Evaluation Patient Details Name: Clayton Austin MRN: [id number] DOB: September 25, 1940 Today's Date: 12/28/2023  History of Present Illness  Clayton Austin is a 83 y.o. male with medical history significant of hypertension, hypothyroidism, hyperlipidemia, coronary artery disease with history of CABG x 5 vessels in and NSTEMI in 2014, paroxysmal atrial fibrillation on anticoagulation.  Patient has been having difficulty urinating for the past 3 to 4 days.  Around the same time, he has noticed some hematuria.  Per his recollection, this has never happened before. He came to the hospital he came to the hospital due to suprapubic pain and difficulty urinating.  No fevers or chills although last night he got weak and fell, striking his head. No confusion or neurodeficits following that episode.  Due to inability to urinate, urology came and placed a Foley catheter.  Per EDP 7 ureteral stones were present which were pushed back into the bladder.  [name] was noted.   Clinical Impression  Patient demonstrates slow labored movement for sitting up at bedside, once seated has tendency to cross legs requiring verbal/tactile to keep feet shoulder width apart, unsteady on feet and limited to a few steps at bedside before having to sit due to fatigue. Patient will benefit from continued skilled physical therapy in hospital and recommended venue below to increase strength, balance, endurance for safe ADLs and gait.          If plan is discharge home, recommend the following: A lot of help with bathing/dressing/bathroom;A lot of help with walking and/or transfers;Help with stairs or ramp for entrance;Assist for transportation   Can travel by private vehicle   Yes    Equipment Recommendations None recommended by PT  Recommendations for Other Services       Functional Status Assessment Patient has had a recent decline in their functional status and demonstrates the ability to make  significant improvements in function in a reasonable and predictable amount of time.     Precautions / Restrictions Precautions Precautions: Fall Recall of Precautions/Restrictions: Intact Restrictions Weight Bearing Restrictions Per Provider Order: No      Mobility  Bed Mobility Overal bed mobility: Needs Assistance Bed Mobility: Supine to Sit     Supine to sit: Mod assist     General bed mobility comments: slow labored movement    Transfers Overall transfer level: Needs assistance Equipment used: Rolling walker (2 wheels) Transfers: Sit to/from Stand, Bed to chair/wheelchair/BSC Sit to Stand: Mod assist   Step pivot transfers: Mod assist       General transfer comment: unsteady labored movement with buckling of knees, scissoring of legs    Ambulation/Gait Ambulation/Gait assistance: Mod assist Gait Distance (Feet): 10 Feet Assistive device: Rolling walker (2 wheels) Gait Pattern/deviations: Decreased step length - left, Decreased stance time - right, Decreased stride length, Trunk flexed, Knees buckling, Scissoring Gait velocity: decreased     General Gait Details: limited to a few steps forward/backwards at bedside with buckling of knees and scissoring of legs, limited mostly due to fatigue  Stairs            Wheelchair Mobility     Tilt Bed    Modified Rankin (Stroke Patients Only)       Balance Overall balance assessment: Needs assistance Sitting-balance support: Feet supported, No upper extremity supported Sitting balance-Leahy Scale: Fair Sitting balance - Comments: seated at EOB   Standing balance support: Bilateral upper extremity supported, During functional activity, Reliant on assistive device for balance Standing balance-Leahy  Scale: Poor Standing balance comment: using RW                             Pertinent Vitals/Pain Pain Assessment Pain Assessment: 0-10 Pain Location: bladder Pain Descriptors / Indicators:  Aching Pain Intervention(s): Limited activity within patient's tolerance, Monitored during session, Repositioned    Home Living Family/patient expects to be discharged to:: Private residence Living Arrangements: Spouse/significant other Available Help at Discharge: Family;Available PRN/intermittently Type of Home: Mobile home Home Access: Stairs to enter Entrance Stairs-Rails: Lawyer of Steps: 8   Home Layout: One level Home Equipment: Other (comment);Cane - quad;BSC/3in1      Prior Function Prior Level of Function : Needs assist       Physical Assist : ADLs (physical);Mobility (physical) Mobility (physical): Bed mobility;Transfers;Gait;Stairs ADLs (physical): Bathing;Dressing;Toileting;IADLs Mobility Comments: Wife reports she helps the pt with bed mobility and transfers using the quad can or walker with a basket. ADLs Comments: Assited for all but grooming and feeding.     Extremity/Trunk Assessment   Upper Extremity Assessment Upper Extremity Assessment: Defer to OT evaluation    Lower Extremity Assessment Lower Extremity Assessment: Generalized weakness    Cervical / Trunk Assessment Cervical / Trunk Assessment: Kyphotic  Communication   Communication Communication: No apparent difficulties    Cognition Arousal: Alert Behavior During Therapy: WFL for tasks assessed/performed                             Following commands: Intact       Cueing Cueing Techniques: Verbal cues, Tactile cues     General Comments      Exercises     Assessment/Plan    PT Assessment Patient needs continued PT services  PT Problem List Decreased strength;Decreased activity tolerance;Decreased balance;Decreased mobility       PT Treatment Interventions DME instruction;Gait training;Stair training;Functional mobility training;Therapeutic activities;Therapeutic exercise;Balance training;Patient/family education    PT Goals (Current  goals can be found in the Care Plan section)  Acute Rehab PT Goals Patient Stated Goal: return home after rehab PT Goal Formulation: With patient/family Time For Goal Achievement: 01/11/24 Potential to Achieve Goals: Good    Frequency Min 3X/week     Co-evaluation PT/OT/SLP Co-Evaluation/Treatment: Yes Reason for Co-Treatment: To address functional/ADL transfers PT goals addressed during session: Mobility/safety with mobility;Balance;Proper use of DME OT goals addressed during session: ADL's and self-care       AM-PAC PT 6 Clicks Mobility  Outcome Measure Help needed turning from your back to your side while in a flat bed without using bedrails?: A Lot Help needed moving from lying on your back to sitting on the side of a flat bed without using bedrails?: A Lot Help needed moving to and from a bed to a chair (including a wheelchair)?: A Lot Help needed standing up from a chair using your arms (e.g., wheelchair or bedside chair)?: A Lot Help needed to walk in hospital room?: A Lot Help needed climbing 3-5 steps with a railing? : A Lot 6 Click Score: 12    End of Session   Activity Tolerance: Patient tolerated treatment well;Patient limited by fatigue Patient left: in chair;with call bell/phone within reach;with family/visitor present Nurse Communication: Mobility status PT Visit Diagnosis: Unsteadiness on feet (R26.81);Other abnormalities of gait and mobility (R26.89);Muscle weakness (generalized) (M62.81)    Time: 8889-8862 PT Time Calculation (min) (ACUTE ONLY): 27 min   Charges:  PT Evaluation $PT Eval Moderate Complexity: 1 Mod PT Treatments $Therapeutic Activity: 23-37 mins PT General Charges $$ ACUTE PT VISIT: 1 Visit         3:14 PM, 12/28/23 Lynwood Music, MPT Physical Therapist with [facility] [phone] office [phone] mobile phone

## 2023-12-28 NOTE — Plan of Care (Signed)

## 2023-12-28 NOTE — Plan of Care (Signed)
  Problem: Acute Rehab PT Goals(only PT should resolve) Goal: Pt Will Go Supine/Side To Sit Outcome: Progressing Flowsheets (Taken 12/28/2023 1517) Pt will go Supine/Side to Sit: with minimal assist Goal: Patient Will Transfer Sit To/From Stand Outcome: Progressing Flowsheets (Taken 12/28/2023 1517) Patient will transfer sit to/from stand:  with contact guard assist  with minimal assist Goal: Pt Will Transfer Bed To Chair/Chair To Bed Outcome: Progressing Flowsheets (Taken 12/28/2023 1517) Pt will Transfer Bed to Chair/Chair to Bed:  with contact guard assist  with min assist Goal: Pt Will Ambulate Outcome: Progressing Flowsheets (Taken 12/28/2023 1517) Pt will Ambulate:  25 feet  with minimal assist  with rolling walker   3:17 PM, 12/28/23 Lynwood Music, MPT Physical Therapist with [facility] [phone] office [phone] mobile phone

## 2023-12-28 NOTE — Plan of Care (Signed)
  Problem: Acute Rehab OT Goals (only OT should resolve) Goal: Pt. Will Perform Grooming Flowsheets (Taken 12/28/2023 1439) Pt Will Perform Grooming: with modified independence Goal: Pt. Will Perform Upper Body Dressing Flowsheets (Taken 12/28/2023 1439) Pt Will Perform Upper Body Dressing:  with set-up  sitting Goal: Pt. Will Perform Lower Body Dressing Flowsheets (Taken 12/28/2023 1439) Pt Will Perform Lower Body Dressing:  with mod assist  sitting/lateral leans  with adaptive equipment Goal: Pt. Will Perform Toileting-Clothing Manipulation Flowsheets (Taken 12/28/2023 1439) Pt Will Perform Toileting - Clothing Manipulation and hygiene:  with mod assist  sitting/lateral leans  with adaptive equipment Goal: Pt/Caregiver Will Perform Home Exercise Program Flowsheets (Taken 12/28/2023 1439) Pt/caregiver will Perform Home Exercise Program:  Increased ROM  Increased strength  Both right and left upper extremity  Independently  Hilde Churchman OT, MOT

## 2023-12-28 NOTE — Evaluation (Signed)
 Occupational Therapy Evaluation Patient Details Name: Clayton Austin MRN: [id number] DOB: 11-05-1940 Today's Date: 12/28/2023   History of Present Illness   Clayton Austin is a 83 y.o. male with medical history significant of hypertension, hypothyroidism, hyperlipidemia, coronary artery disease with history of CABG x 5 vessels in and NSTEMI in 2014, paroxysmal atrial fibrillation on anticoagulation.  Patient has been having difficulty urinating for the past 3 to 4 days.  Around the same time, he has noticed some hematuria.  Per his recollection, this has never happened before. He came to the hospital he came to the hospital due to suprapubic pain and difficulty urinating.  No fevers or chills although last night he got weak and fell, striking his head. No confusion or neurodeficits following that episode.  Due to inability to urinate, urology came and placed a Foley catheter.  Per EDP 7 ureteral stones were present which were pushed back into the bladder.  Clayton Austin hematuria was noted. (per DO)     Clinical Impressions Pt agreeable to OT and PT co-evaluation. Pt is assisted for mobility and ADL's at baseline but wife reports she cannot provide the amount of support the pt is needing today. Wife has her own health issues that currently limit how much weight she can pick up. Pt required mod A for bed mobility and EOB to chair transfer. Pt demonstrates very slow and labored movement with some anxiety when transferring at first. B UE weak with limitations likely from baseline arthritis. Pt appears to need max to total assist for LB ADL's and min to mod A for UB ADL's. Pt left in the chair with call bell within reach and family present. Pt will benefit from continued OT in the hospital and recommended venue below to increase strength, balance, and endurance for safe ADL's.        If plan is discharge home, recommend the following:   A lot of help with bathing/dressing/bathroom;Assistance with  cooking/housework;A little help with walking and/or transfers;Assist for transportation;Help with stairs or ramp for entrance     Functional Status Assessment   Patient has had a recent decline in their functional status and demonstrates the ability to make significant improvements in function in a reasonable and predictable amount of time.     Equipment Recommendations   None recommended by OT             Precautions/Restrictions   Precautions Precautions: Fall Recall of Precautions/Restrictions: Intact Restrictions Weight Bearing Restrictions Per Provider Order: No     Mobility Bed Mobility Overal bed mobility: Needs Assistance Bed Mobility: Supine to Sit     Supine to sit: Mod assist     General bed mobility comments: labored movement; assist to pull to sit    Transfers Overall transfer level: Needs assistance Equipment used: Rolling walker (2 wheels) Transfers: Sit to/from Stand, Bed to chair/wheelchair/BSC Sit to Stand: Mod assist     Step pivot transfers: Mod assist     General transfer comment: very slow and labored movement      Balance Overall balance assessment: Needs assistance Sitting-balance support: No upper extremity supported, Feet supported Sitting balance-Leahy Scale: Fair Sitting balance - Comments: seated at EOB   Standing balance support: Bilateral upper extremity supported, During functional activity, Reliant on assistive device for balance Standing balance-Leahy Scale: Poor Standing balance comment: using RW  ADL either performed or assessed with clinical judgement   ADL Overall ADL's : Needs assistance/impaired     Grooming: Minimal assistance;Sitting;Set up   Upper Body Bathing: Moderate assistance;Minimal assistance;Sitting   Lower Body Bathing: Maximal assistance;Total assistance;Sitting/lateral leans   Upper Body Dressing : Moderate assistance;Minimal assistance;Sitting    Lower Body Dressing: Maximal assistance;Total assistance;Sitting/lateral leans   Toilet Transfer: Moderate assistance;Stand-pivot;Rolling walker (2 wheels) Toilet Transfer Details (indicate cue type and reason): EOB to chair Toileting- Clothing Manipulation and Hygiene: Maximal assistance;Total assistance;Sitting/lateral lean;Bed level       Functional mobility during ADLs: Moderate assistance;Rolling walker (2 wheels) General ADL Comments: Pt ambulated ~8 feet tototal in the room with RW forward and backward.     Vision Baseline Vision/History: 1 Wears glasses Ability to See in Adequate Light: 1 Impaired Patient Visual Report: No change from baseline Vision Assessment?: No apparent visual deficits     Perception Perception: Not tested       Praxis Praxis: Not tested       Pertinent Vitals/Pain Pain Assessment Pain Assessment: 0-10 Pain Score: 5  Pain Location: bladder Pain Descriptors / Indicators: Aching Pain Intervention(s): Limited activity within patient's tolerance, Monitored during session, Repositioned     Extremity/Trunk Assessment Upper Extremity Assessment Upper Extremity Assessment: Generalized weakness (3-/5 shoulder flexion bilaterally; 2+/5 gross grasp bilaterally with baseline arthritis; Poor fine motor skills, likely baseline issue.)   Lower Extremity Assessment Lower Extremity Assessment: Defer to PT evaluation   Cervical / Trunk Assessment Cervical / Trunk Assessment: Kyphotic   Communication Communication Communication: No apparent difficulties   Cognition Arousal: Alert Behavior During Therapy: WFL for tasks assessed/performed Cognition: No apparent impairments                               Following commands: Intact       Cueing  General Comments   Cueing Techniques: Verbal cues;Tactile cues                 Home Living Family/patient expects to be discharged to:: Private residence Living Arrangements:  Spouse/significant other Available Help at Discharge: Family;Available PRN/intermittently Type of Home: Mobile home Home Access: Stairs to enter Entrance Stairs-Number of Steps: 8 Entrance Stairs-Rails: Left;Right (can't reach both; wife reports that it is wood, not typical railings.) Home Layout: One level     Bathroom Shower/Tub: Tub/shower unit;Walk-in shower   Bathroom Toilet: Handicapped height (BSC over toilet) Bathroom Accessibility: Yes How Accessible: Accessible via walker Home Equipment: Other (comment);Cane - quad;BSC/3in1 Environmental manager with a basket; shower stool)          Prior Functioning/Environment Prior Level of Function : Needs assist       Physical Assist : ADLs (physical);Mobility (physical) Mobility (physical): Bed mobility;Transfers;Gait;Stairs ADLs (physical): Bathing;Dressing;Toileting;IADLs Mobility Comments: Wife reports she helps the pt with bed mobility and transfers using the quad can or walker with a basket. ADLs Comments: Assited for all but grooming and feeding.    OT Problem List: Decreased strength;Decreased range of motion;Decreased activity tolerance;Impaired balance (sitting and/or standing);Decreased coordination   OT Treatment/Interventions: Self-care/ADL training;Therapeutic exercise;Therapeutic activities;DME and/or AE instruction;Patient/family education;Balance training      OT Goals(Current goals can be found in the care plan section)   Acute Rehab OT Goals Patient Stated Goal: improve function OT Goal Formulation: With patient/family Time For Goal Achievement: 01/11/24 Potential to Achieve Goals: Good   OT Frequency:  Min 3X/week    Co-evaluation PT/OT/SLP Co-Evaluation/Treatment: Yes Reason  for Co-Treatment: To address functional/ADL transfers   OT goals addressed during session: ADL's and self-care                       End of Session Equipment Utilized During Treatment: Rolling walker (2 wheels)  Activity  Tolerance: Patient tolerated treatment well Patient left: in chair;with call bell/phone within reach;with family/visitor present  OT Visit Diagnosis: Unsteadiness on feet (R26.81);Other abnormalities of gait and mobility (R26.89);Muscle weakness (generalized) (M62.81)                Time: 8879-8861 OT Time Calculation (min): 18 min Charges:  OT General Charges $OT Visit: 1 Visit OT Evaluation $OT Eval Low Complexity: 1 Low  Sigurd Pugh OT, MOT  Jayson Person 12/28/2023, 2:36 PM

## 2023-12-29 ENCOUNTER — Other Ambulatory Visit: Payer: Self-pay | Admitting: Urology

## 2023-12-29 DIAGNOSIS — N210 Calculus in bladder: Secondary | ICD-10-CM | POA: Diagnosis not present

## 2023-12-29 DIAGNOSIS — I959 Hypotension, unspecified: Secondary | ICD-10-CM | POA: Diagnosis not present

## 2023-12-29 DIAGNOSIS — Z515 Encounter for palliative care: Secondary | ICD-10-CM

## 2023-12-29 DIAGNOSIS — N020 Recurrent and persistent hematuria with minor glomerular abnormality: Secondary | ICD-10-CM

## 2023-12-29 DIAGNOSIS — A419 Sepsis, unspecified organism: Secondary | ICD-10-CM | POA: Diagnosis not present

## 2023-12-29 DIAGNOSIS — Z7189 Other specified counseling: Secondary | ICD-10-CM

## 2023-12-29 DIAGNOSIS — N390 Urinary tract infection, site not specified: Secondary | ICD-10-CM | POA: Diagnosis not present

## 2023-12-29 DIAGNOSIS — Z79899 Other long term (current) drug therapy: Secondary | ICD-10-CM | POA: Diagnosis not present

## 2023-12-29 LAB — CBC
HCT: 28 % — ABNORMAL LOW (ref 39.0–52.0)
Hemoglobin: 9.3 g/dL — ABNORMAL LOW (ref 13.0–17.0)
MCH: 30.7 pg (ref 26.0–34.0)
MCHC: 33.2 g/dL (ref 30.0–36.0)
MCV: 92.4 fL (ref 80.0–100.0)
Platelets: 263 K/uL (ref 150–400)
RBC: 3.03 MIL/uL — ABNORMAL LOW (ref 4.22–5.81)
RDW: 14.7 % (ref 11.5–15.5)
WBC: 11.4 K/uL — ABNORMAL HIGH (ref 4.0–10.5)
nRBC: 0 % (ref 0.0–0.2)

## 2023-12-29 LAB — COMPREHENSIVE METABOLIC PANEL WITH GFR
ALT: 12 U/L (ref 0–44)
AST: 20 U/L (ref 15–41)
Albumin: 2.2 g/dL — ABNORMAL LOW (ref 3.5–5.0)
Alkaline Phosphatase: 52 U/L (ref 38–126)
Anion gap: 5 (ref 5–15)
BUN: 22 mg/dL (ref 8–23)
CO2: 25 mmol/L (ref 22–32)
Calcium: 7.8 mg/dL — ABNORMAL LOW (ref 8.9–10.3)
Chloride: 103 mmol/L (ref 98–111)
Creatinine, Ser: 0.95 mg/dL (ref 0.61–1.24)
GFR, Estimated: >60 mL/min (ref >60)
Glucose, Bld: 88 mg/dL (ref 70–99)
Potassium: 4 mmol/L (ref 3.5–5.1)
Sodium: 133 mmol/L — ABNORMAL LOW (ref 135–145)
Total Bilirubin: 0.5 mg/dL (ref 0.0–1.2)
Total Protein: 5 g/dL — ABNORMAL LOW (ref 6.5–8.1)

## 2023-12-29 MED ORDER — ENSURE PLUS HIGH PROTEIN PO LIQD: 237.0000 mL | Freq: Two times a day (BID) | ORAL | 0 refills | Status: DC

## 2023-12-29 MED ORDER — ENSURE PLUS HIGH PROTEIN PO LIQD
237.0000 mL | Freq: Three times a day (TID) | ORAL | Status: DC
  Administered 2023-12-29 – 2023-12-31 (×7): 237 mL via ORAL

## 2023-12-29 MED ORDER — CIPROFLOXACIN HCL 250 MG PO TABS
500.0000 mg | ORAL_TABLET | Freq: Two times a day (BID) | ORAL | Status: DC
  Administered 2023-12-29 – 2023-12-31 (×5): 500 mg via ORAL
  Filled 2023-12-29 (×5): qty 2

## 2023-12-29 MED ORDER — ADULT MULTIVITAMIN W/MINERALS CH
1.0000 | ORAL_TABLET | Freq: Every day | ORAL | Status: DC
  Administered 2023-12-29 – 2023-12-31 (×3): 1 via ORAL
  Filled 2023-12-29 (×3): qty 1

## 2023-12-29 MED ORDER — FLORANEX PO PACK
1.0000 g | PACK | Freq: Three times a day (TID) | ORAL | Status: DC
  Administered 2023-12-29 – 2023-12-31 (×8): 1 g via ORAL
  Filled 2023-12-29 (×8): qty 1

## 2023-12-29 NOTE — Progress Notes (Signed)
 Initial Nutrition Assessment  DOCUMENTATION CODES:   Non-severe (moderate) malnutrition in context of chronic illness  INTERVENTION:   Ensure Plus High Protein po TID, each supplement provides 350 kcal and 20 grams of protein Magic cup TID with meals, each supplement provides 290 kcal and 9 grams of protein MVI with minerals daily  NUTRITION DIAGNOSIS:   Moderate Malnutrition related to chronic illness (CAD) as evidenced by mild muscle depletion, moderate muscle depletion, mild fat depletion, moderate fat depletion.  GOAL:   Patient will meet greater than or equal to 90% of their needs  MONITOR:   PO intake, Supplement acceptance  REASON FOR ASSESSMENT:   Malnutrition Screening Tool    ASSESSMENT:   83 yo male admitted with sepsis, hematuria, UTI, anemia, AKI d/t ureteral obstruction. PMH includes HTN, HLD, BPH, hypothyroidism, A fib, CAD, NSTEMI, CABGx5 2014, GERD.  Patient reports that he was eating very poorly for a few weeks PTA d/t no appetite. He endorses a lot of weight loss. He has been eating well for the past few days. He likes the hospital food and the Ensure supplements. He wants to try magic cups with meals.   Currently on a heart healthy diet. Meal intakes 75-100%.  Weight history reviewed. Patient has had 13% weight loss over the past 2 years, but seems to have stabilized and is now trending back up. Weight up since admission likely d/t fluids. Admit weight: 59.3 kg Current weight 67.2 kg I/O +3.4 L since admit  Labs reviewed.  Na 133 Albumin  2.2 Corrected calcium  9.24 WNL   Medications reviewed and include lactobacillus, protonix , senokot, flomax .   Patient meets criteria for moderate malnutrition, given mild-moderate depletion of muscle and subcutaneous fat mass.  NUTRITION - FOCUSED PHYSICAL EXAM:  Flowsheet Row Most Recent Value  Orbital Region Mild depletion  Upper Arm Region Moderate depletion  Thoracic and Lumbar Region Moderate depletion   Buccal Region Mild depletion  Temple Region Moderate depletion  Clavicle Bone Region Moderate depletion  Clavicle and Acromion Bone Region Moderate depletion  Scapular Bone Region Moderate depletion  Dorsal Hand Mild depletion  Patellar Region Mild depletion  Anterior Thigh Region Mild depletion  Posterior Calf Region Moderate depletion  Edema (RD Assessment) None  Hair Reviewed  Eyes Reviewed  Mouth Other (Comment)  [poor dentition]  Skin Reviewed  Nails Reviewed    Diet Order:   Diet Order             Diet Heart Room service appropriate? Yes; Fluid consistency: Thin  Diet effective now                   EDUCATION NEEDS:   Education needs have been addressed  Skin:  Skin Assessment: Reviewed RN Assessment  Last BM:  9/8 type 1  Height:   Ht Readings from Last 1 Encounters:  12/27/23 5' 9 (1.753 m)    Weight:   Wt Readings from Last 1 Encounters:  12/28/23 67.2 kg    Ideal Body Weight:  72.7 kg  BMI:  Body mass index is 21.88 kg/m.  Estimated Nutritional Needs:   Kcal:  1750-1950  Protein:  85-100 gm  Fluid:  1.7-1.9 L   Suzen HUNT RD, LDN, CNSC Contact via secure chat. If unavailable, use group chat RD Inpatient.

## 2023-12-29 NOTE — Progress Notes (Signed)
 PROGRESS NOTE    Patient: Clayton Austin                            PCP: Fanta, Tesfaye Demissie, MD                    DOB: March 29, 1941            DOA: 12/25/2023 FMW:[id number]             DOS: 12/29/2023, 10:18 AM   LOS: 4 days   Date of Service: The patient was seen and examined on 12/29/2023  Subjective:   The patient was seen and examined this morning, stable no acute distress Mildly hypotensive BP 102/46 otherwise hemodynamically stable, satting 100% on room air  S/p 2U PRBC-tolerated well, hemoglobin improved to 10.2 today Foley catheter in place still noticing serosanguineous reddish hue urine in bag  Brief Narrative:   Clayton Austin is a 83 y.o. male with medical history significant of hypertension, hypothyroidism, hyperlipidemia, coronary artery disease with history of CABG x 5 vessels in and NSTEMI in 2014, paroxysmal atrial fibrillation on anticoagulation.  Patient has been having difficulty urinating for the past 3 to 4 days.   Around the same time, he has noticed some hematuria.  Per his recollection, this has never happened before. He came to the hospital he came to the hospital due to suprapubic pain and difficulty urinating.  No fevers or chills although last night he got weak and fell, striking his head. Due to inability to urinate, urology came and placed a Foley catheter.   Per EDP 7 ureteral stones were present which were pushed back into the bladder.  Clayton Austin hematuria was noted.    Urology, Dr. Sherrilee,     Assessment & Plan:   Principal Problem:   Sepsis [facility]) Active Problems:   CAD (coronary artery disease) of artery bypass graft   Hypothyroidism   Benign prostatic hyperplasia with weak urinary stream   Benign essential HTN   Bladder stone   Paroxysmal atrial fibrillation (HCC)   Atrial fibrillation with RVR (HCC)     Urosepsis sepsis (HCC) Resolved sepsis physiology POA: Met sepsis criteria due to UTI, nephrolithiasis  -During day 12/27/23  -patient was found hypothermic, mildly hypotensive-transferred to stepdown -hemodynamically stable  - Status post 2U PRBC transfusion, and bolus LR- -has been on IV antibiotic Rocephin , cultures no growth to date-switching to p.o. ciprofloxacin   Hematuria with ureteral obstruction due to ureteral stone - Due to UTI ureter stones - Improving, stable now - Foley catheter in place-will be discharged with Foley catheter and follow-up with Dr. Sherrilee  -Status post Foley catheter placement - noted light bloody urine - Dr. Sherrilee has seen and evaluated patient on admission agrees with antibiotics Once stable resume Eliquis - scheduling for outpatient follow-up Surgical manage for removal of stones in next 3-4 weeks   Hypotension -resolved, discontinue midodrine    acute on chronic anemia exacerbated by hematuria -POA hemoglobin 6.1>> 7.6, 6.9>>> 9.89  -Monitoring H&H - Holding anticoagulation - S/p 1U PRBC transfusion on this admission - 12/27/2023, pursuing with 2U PRBC blood transfusion     Latest Ref Rng & Units 12/28/2023    4:26 AM 12/27/2023    8:44 PM 12/27/2023    6:51 AM  CBC  WBC 4.0 - 10.5 K/uL 11.5     Hemoglobin 13.0 - 17.0 g/dL 9.9  89.7  6.9   Hematocrit 39.0 -  52.0 % 29.0  30.3  20.6   Platelets 150 - 400 K/uL 250       Acute renal insufficiency  - Ruling out CKD-improving BUN/creatinine - Exacerbated by nephrolithiasis, UTI - Foley catheter in place -Monitor kidney function closely - Avoid nephrotoxins, hypotension Lab Results  Component Value Date   CREATININE 0.95 12/29/2023   CREATININE 1.26 (H) 12/28/2023   CREATININE 1.41 (H) 12/27/2023    CAD (coronary artery disease) of artery bypass graft - Continue home medication, holding anticoagulation  Hypothyroidism - Continue Synthroid    Benign prostatic hyperplasia with weak urinary stream -Monitoring, Foley catheter currently in place -Continue finasteride ,  -Follow-up with urologist  Benign essential  HTN - hypotensive - BP soft, holding Norvasc , losartan    Bladder stone -management as above Foley catheter in place   Paroxysmal atrial fibrillation (HCC) -holding Eliquis, continue metoprolol        -------------------------------------------------------------------------------------------------------------------------- Nutritional status:  The patient's BMI is: Body mass index is 21.88 kg/m. I agree with the assessment and plan as outlined     ---------------------------------------------------------------------------------------------------------------------- Cultures; Blood Cultures x 2 >> no growth to date Urine Culture  >>> no growth to date  ----------------------------------------------------------------------------------------------------------------------  DVT prophylaxis:  Place and maintain sequential compression device Start: 12/29/23 0706 SCDs Start: 12/25/23 1926   Code Status:   Code Status: Full Code  Family Communication: Son present at bedside-updated -Advance care planning has been discussed.   Admission status:   Status is: Inpatient Remains inpatient appropriate because: Needing treatment for sepsis, IV fluid, IV antibiotics, Foley catheter   Disposition: From  - home             Planning for discharge in 1 days to SNF  Procedures:   No admission procedures for hospital encounter.   Antimicrobials:  Anti-infectives (From admission, onward)    Start     Dose/Rate Route Frequency Ordered Stop   12/29/23 1115  ciprofloxacin  (CIPRO ) tablet 500 mg        500 mg Oral 2 times daily 12/29/23 1017     12/26/23 1600  cefTRIAXone  (ROCEPHIN ) 1 g in sodium chloride  0.9 % 100 mL IVPB  Status:  Discontinued        1 g 200 mL/hr over 30 Minutes Intravenous Every 24 hours 12/25/23 1927 12/29/23 1017   12/25/23 1600  cefTRIAXone  (ROCEPHIN ) 1 g in sodium chloride  0.9 % 100 mL IVPB        1 g 200 mL/hr over 30 Minutes Intravenous  Once 12/25/23 1553  12/25/23 1730        Medication:   atorvastatin   20 mg Oral q morning   Chlorhexidine  Gluconate Cloth  6 each Topical Daily   ciprofloxacin   500 mg Oral BID   feeding supplement  237 mL Oral BID BM   finasteride   5 mg Oral Daily   lactobacillus  1 g Oral TID WC   levothyroxine   100 mcg Oral Q0600   melatonin  6 mg Oral QHS   metoprolol  tartrate  12.5 mg Oral BID   pantoprazole   40 mg Oral Daily   sennosides  10 mL Oral BID   tamsulosin   0.4 mg Oral BID   Tdap  0.5 mL Intramuscular Once    acetaminophen  **OR** acetaminophen , HYDROmorphone  (DILAUDID ) injection, ondansetron  **OR** ondansetron  (ZOFRAN ) IV, mouth rinse   Objective:   Vitals:   12/29/23 0500 12/29/23 0600 12/29/23 0700 12/29/23 1016  BP: (!) 119/58  (!) 140/57 (!) 113/43  Pulse: (!) 59 (!) 58 (!) 59  62  Resp: 11 14 11    Temp:   97.7 F (36.5 C)   TempSrc:   Oral   SpO2: 98% 99% 100%   Weight:      Height:        Intake/Output Summary (Last 24 hours) at 12/29/2023 1018 Last data filed at 12/29/2023 0831 Gross per 24 hour  Intake 360 ml  Output 750 ml  Net -390 ml   Filed Weights   12/25/23 2115 12/27/23 1508 12/28/23 0400  Weight: 59.3 kg 64.9 kg 67.2 kg     Physical examination:   General:  AAO x 3,  cooperative, no distress;   HEENT:  Normocephalic, PERRL, otherwise with in Normal limits   Neuro:  CNII-XII intact. , normal motor and sensation, reflexes intact   Lungs:   Clear to auscultation BL, Respirations unlabored,  No wheezes / crackles  Cardio:    S1/S2, RRR, No murmure, No Rubs or Gallops   Abdomen:  Soft, non-tender, bowel sounds active all four quadrants, no guarding or peritoneal signs.  Muscular  skeletal:  Limited exam -global generalized weaknesses - in bed, able to move all 4 extremities,   2+ pulses,  symmetric, No pitting edema  Skin:  Dry, warm to touch, negative for any Rashes,   Foley catheter in place  wounds: None  visible ---------------------------------------------------------------------------------------------------------------------------    LABs:     Latest Ref Rng & Units 12/29/2023    5:28 AM 12/28/2023    4:26 AM 12/27/2023    8:44 PM  CBC  WBC 4.0 - 10.5 K/uL 11.4  11.5    Hemoglobin 13.0 - 17.0 g/dL 9.3  9.9  89.7   Hematocrit 39.0 - 52.0 % 28.0  29.0  30.3   Platelets 150 - 400 K/uL 263  250        Latest Ref Rng & Units 12/29/2023    5:28 AM 12/28/2023    4:26 AM 12/27/2023    4:23 AM  CMP  Glucose 70 - 99 mg/dL 88  86  891   BUN 8 - 23 mg/dL 22  25  26    Creatinine 0.61 - 1.24 mg/dL 9.04  8.73  8.58   Sodium 135 - 145 mmol/L 133  134  135   Potassium 3.5 - 5.1 mmol/L 4.0  4.0  4.1   Chloride 98 - 111 mmol/L 103  108  106   CO2 22 - 32 mmol/L 25  24  24    Calcium  8.9 - 10.3 mg/dL 7.8  8.0  8.1   Total Protein 6.5 - 8.1 g/dL 5.0  5.3  5.1   Total Bilirubin 0.0 - 1.2 mg/dL 0.5  0.8  0.8   Alkaline Phos 38 - 126 U/L 52  56  51   AST 15 - 41 U/L 20  19  23    ALT 0 - 44 U/L 12  14  14         Micro Results Recent Results (from the past 240 hours)  Urine Culture     Status: None   Collection Time: 12/25/23  1:49 PM   Specimen: Urine, Clean Catch  Result Value Ref Range Status   Specimen Description   Final    URINE, CLEAN CATCH Performed at [facility], [address], [place], [place] [address]    Special Requests   Final    NONE Performed at [facility], [address], [place], [place] [address]    Culture   Final  NO GROWTH Performed at [facility], [address], [place], [place] [address]    Report Status 12/26/2023 FINAL  Final  Culture, blood (Routine X 2) w Reflex to ID Panel     Status: None (Preliminary result)   Collection Time: 12/27/23  2:35 PM   Specimen: BLOOD  Result Value Ref Range Status   Specimen Description BLOOD BLOOD LEFT ARM  Final   Special Requests   Final    BOTTLES DRAWN AEROBIC AND ANAEROBIC Blood Culture adequate volume    Culture   Final    NO GROWTH 2 DAYS Performed at [facility], [address] [place], [place] [address]    Report Status PENDING  Incomplete  MRSA Next Gen by PCR, Nasal     Status: None   Collection Time: 12/27/23  3:02 PM   Specimen: Nasal Mucosa; Nasal Swab  Result Value Ref Range Status   MRSA by PCR Next Gen NOT DETECTED NOT DETECTED Final    Comment: (NOTE) The GeneXpert MRSA Assay (FDA approved for NASAL specimens only), is one component of a comprehensive MRSA colonization surveillance program. It is not intended to diagnose MRSA infection nor to guide or monitor treatment for MRSA infections. Test performance is not FDA approved in patients less than 40 years old. Performed at [facility], [address], [place], [place] [address]   Culture, blood (Routine X 2) w Reflex to ID Panel     Status: None (Preliminary result)   Collection Time: 12/27/23  4:38 PM   Specimen: BLOOD  Result Value Ref Range Status   Specimen Description BLOOD LEFT ANTECUBITAL  Final   Special Requests   Final    BOTTLES DRAWN AEROBIC AND ANAEROBIC Blood Culture adequate volume   Culture   Final    NO GROWTH 2 DAYS Performed at [facility], [address] [place], [place] [address]    Report Status PENDING  Incomplete    Radiology Reports No results found.   SIGNED: Adriana DELENA Grams, MD, FHM. FAAFP. Jolynn Pack - [facility] Time spent - 55 min.  In seeing, evaluating and examining the patient. Reviewing medical records, labs, drawn plan of care. [facility],  Pager (please use [email or web address] to page/ text) Please use Epic Secure Chat for non-urgent communication (7AM-7PM)  If 7PM-7AM, please contact night-coverage [email or web address], 12/29/2023, 10:18 AM

## 2023-12-29 NOTE — TOC Progression Note (Signed)
 Transition of Care [facility]) - Progression Note    Patient Details  Name: Clayton Austin MRN: [id number] Date of Birth: 02-06-1941  Transition of Care [facility]) CM/SW Contact  Sharlyne Stabs, RN Phone Number: 12/29/2023, 11:07 AM  Clinical Narrative:   Discussed bed offers with his wife. [facility] has bed and this will be close to their home. Added bed offer to [employer].    Expected Discharge Plan: Skilled Nursing Facility Barriers to Discharge: Insurance Authorization     Expected Discharge Plan and Services      Living arrangements for the past 2 months: Single Family Home                    Social Drivers of Health (SDOH) Interventions SDOH Screenings   Food Insecurity: No Food Insecurity (12/25/2023)  Housing: Low Risk  (12/25/2023)  Transportation Needs: No Transportation Needs (12/25/2023)  Utilities: Not At Risk (12/25/2023)  Financial Resource Strain: Low Risk  (08/28/2022)   [facility] from [facility]  Social Connections: Moderately Isolated (12/25/2023)  Stress: No Stress Concern Present (08/03/2022)   Received from [facility]  Tobacco Use: Medium Risk (12/25/2023)  Health Literacy: Low Risk  (08/16/2020)   Received from [facility]    Readmission Risk Interventions     No data to display

## 2023-12-29 NOTE — Progress Notes (Signed)
 PT Cancellation Note  Patient Details Name: Clayton Austin MRN: [id number] DOB: [birth year]   Cancelled Treatment:    Reason Eval/Treat Not Completed: Medical issues which prohibited therapy Attempted PT session this date. At rest, patient is tachycardic, up to 140s. Nursing also confirms EKG exhibiting long QT/PVCs. For pt safety, will attempt PT at later time as appropriate.    3:24 PM, 12/29/23 Rosaria Settler, PT, DPT [place] [facility]

## 2023-12-29 NOTE — [place] FL2 (Signed)
 [place]  MEDICAID FL2 LEVEL OF CARE FORM     IDENTIFICATION  Patient Name: Clayton Austin Birthdate: [birth year] Sex: male Admission Date (Current Location): 12/25/2023  [facility] and [place] Number:  [employer] and Address:  [facility],  [address], [place] [address]      Provider Number: [id number]  Attending Physician Name and Address:  Willette Adriana LABOR, MD  Relative Name and Phone Number:  Lennix, Kneisel (Spouse)  [phone]    Current Level of Care: Hospital Recommended Level of Care: Skilled Nursing Facility Prior Approval Number:    Date Approved/Denied:   PASRR Number: [id number]  Discharge Plan: SNF    Current Diagnoses: Patient Active Problem List   Diagnosis Date Noted   Severe sepsis (HCC) 12/25/2023   Sepsis (HCC) 12/25/2023   LVH (left ventricular hypertrophy) 07/14/2023   Former smoker 04/06/2023   Kidney stones 10/09/2022   Bladder stone 10/09/2022   Urethral diverticulum 10/09/2022   Benign essential HTN 08/03/2022   Paroxysmal atrial fibrillation (HCC) 08/03/2022   Constipation 08/02/2022   Atrial fibrillation with RVR (HCC) 08/02/2022   Decreased bladder capacity 12/09/2018   Carotid stenosis, right 07/10/2017   Bladder trabeculation 05/07/2017   Erectile dysfunction due to diseases classified elsewhere 05/07/2017   Nocturia 05/07/2017   Postprocedural anterior urethral stricture 05/07/2017   Anemia 09/17/2012   Renal insufficiency 09/17/2012   Hypothyroidism 09/14/2012   Edema 08/29/2012   Hiccup 08/29/2012   HLD (hyperlipidemia) 08/26/2012   HTN (hypertension) 08/26/2012   Benign prostatic hyperplasia with lower urinary tract symptoms 08/26/2012   CAD (coronary artery disease) of artery bypass graft 08/24/2012    Orientation RESPIRATION BLADDER Height & Weight     Self, Situation, Place, Time  Normal Continent Weight: 67.2 kg Height:  5' 9 (175.3 cm)  BEHAVIORAL SYMPTOMS/MOOD NEUROLOGICAL BOWEL  NUTRITION STATUS      Continent Diet (See DC Summary)  AMBULATORY STATUS COMMUNICATION OF NEEDS Skin   Extensive Assist Verbally Normal                       Personal Care Assistance Level of Assistance  Bathing, Feeding, Dressing Bathing Assistance: Maximum assistance Feeding assistance: Limited assistance Dressing Assistance: Maximum assistance     Functional Limitations Info  Sight, Hearing, Speech Sight Info: Impaired Hearing Info: Impaired Speech Info: Adequate    SPECIAL CARE FACTORS FREQUENCY  PT (By licensed PT)     PT Frequency: 5 times a week              Contractures Contractures Info: Not present    Additional Factors Info  Code Status, Allergies Code Status Info: FULL Allergies Info: NKDA           Current Medications (12/29/2023):  This is the current hospital active medication list Current Facility-Administered Medications  Medication Dose Route Frequency Provider Last Rate Last Admin   acetaminophen  (TYLENOL ) tablet 650 mg  650 mg Oral Q6H PRN Shahmehdi, Seyed A, MD   650 mg at 12/26/23 1109   Or   acetaminophen  (TYLENOL ) suppository 650 mg  650 mg Rectal Q6H PRN Shahmehdi, Seyed A, MD       atorvastatin  (LIPITOR ) tablet 20 mg  20 mg Oral q morning Shahmehdi, Seyed A, MD   20 mg at 12/28/23 0911   cefTRIAXone  (ROCEPHIN ) 1 g in sodium chloride  0.9 % 100 mL IVPB  1 g Intravenous Q24H Shahmehdi, Seyed A, MD 200 mL/hr at 12/28/23 1711 1 g at  12/28/23 1711   Chlorhexidine  Gluconate Cloth 2 % PADS 6 each  6 each Topical Daily Shahmehdi, Seyed A, MD   6 each at 12/28/23 0912   feeding supplement (ENSURE PLUS HIGH PROTEIN) liquid 237 mL  237 mL Oral BID BM Shahmehdi, Seyed A, MD   237 mL at 12/28/23 0914   finasteride  (PROSCAR ) tablet 5 mg  5 mg Oral Daily Shahmehdi, Seyed A, MD   5 mg at 12/28/23 9087   HYDROmorphone  (DILAUDID ) injection 0.5 mg  0.5 mg Intravenous Q4H PRN Shahmehdi, Seyed A, MD   0.5 mg at 12/29/23 0249   levothyroxine  (SYNTHROID )  tablet 100 mcg  100 mcg Oral Q0600 Shahmehdi, Seyed A, MD   100 mcg at 12/29/23 0541   melatonin tablet 6 mg  6 mg Oral QHS Shahmehdi, Seyed A, MD   6 mg at 12/28/23 2122   metoprolol  tartrate (LOPRESSOR ) tablet 12.5 mg  12.5 mg Oral BID Shahmehdi, Seyed A, MD   12.5 mg at 12/28/23 2122   midodrine  (PROAMATINE ) tablet 2.5 mg  2.5 mg Oral TID WC Shahmehdi, Seyed A, MD   2.5 mg at 12/29/23 0734   ondansetron  (ZOFRAN ) tablet 4 mg  4 mg Oral Q6H PRN Shahmehdi, Seyed A, MD       Or   ondansetron  (ZOFRAN ) injection 4 mg  4 mg Intravenous Q6H PRN Shahmehdi, Seyed A, MD       Oral care mouth rinse  15 mL Mouth Rinse PRN Shahmehdi, Seyed A, MD       pantoprazole  (PROTONIX ) EC tablet 40 mg  40 mg Oral Daily Shahmehdi, Seyed A, MD   40 mg at 12/28/23 0911   sennosides (SENOKOT) 8.8 MG/5ML syrup 10 mL  10 mL Oral BID Shahmehdi, Seyed A, MD   10 mL at 12/28/23 2122   tamsulosin  (FLOMAX ) capsule 0.4 mg  0.4 mg Oral BID Shahmehdi, Seyed A, MD   0.4 mg at 12/28/23 2123   Tdap (BOOSTRIX) injection 0.5 mL  0.5 mL Intramuscular Once Shahmehdi, Seyed A, MD         Discharge Medications: Please see discharge summary for a list of discharge medications.  Relevant Imaging Results:  Relevant Lab Results:   Additional Information SS# [id number]  Sharlyne Stabs, RN

## 2023-12-29 NOTE — Discharge Instructions (Signed)
 This patients foley cannot be removed due to requiring cystoscopy to place another foley. Any issues with the foley please contact urology. His urologist is Dr. Sherrilee with [facility] Stay Proper nutrition can help your body recover from illness and injury.   Foods and beverages high in protein, vitamins, and minerals help rebuild muscle loss, promote healing, & reduce fall risk.   In addition to eating healthy foods, a nutrition shake is an easy, delicious way to get the nutrition you need during and after your hospital stay  It is recommended that you continue to drink 2 bottles per day of:       Ensure Plus for at least 1 month (30 days) after your hospital stay   Tips for adding a nutrition shake into your routine: As allowed, drink one with vitamins or medications instead of water  or juice Enjoy one as a tasty mid-morning or afternoon snack Drink cold or make a milkshake out of it Drink one instead of milk with cereal or snacks Use as a coffee creamer   Available at the following grocery stores and pharmacies:           * [place] [facility] * [employer] * [employer]co  * Rite Aid          * Walmart * [employer]  * Walgreens      * Target  * BJ's   * CVS  * [employer]   * [facility] [phone]            For COUPONS visit: [email or web address]/join or [email or web address]   Suggested Substitutions Ensure Plus = Boost Plus = Carnation Breakfast Essentials = Boost Compact Ensure Active Clear = Boost Breeze Glucerna Shake = Boost Glucose Control = Carnation Breakfast Essentials SUGAR FREE     IMPORTANT INFORMATION: PAY CLOSE ATTENTION   PHYSICIAN DISCHARGE INSTRUCTIONS  Follow with Primary care provider  Carlette Benita Area, MD  and other consultants as instructed by your Hospitalist Physician  SEEK MEDICAL CARE OR RETURN TO EMERGENCY ROOM IF SYMPTOMS COME BACK, WORSEN OR NEW PROBLEM DEVELOPS   Please  note: You were cared for by a hospitalist during your hospital stay. Every effort will be made to forward records to your primary care provider.  You can request that your primary care provider send for your hospital records if they have not received them.  Once you are discharged, your primary care physician will handle any further medical issues. Please note that NO REFILLS for any discharge medications will be authorized once you are discharged, as it is imperative that you return to your primary care physician (or establish a relationship with a primary care physician if you do not have one) for your post hospital discharge needs so that they can reassess your need for medications and monitor your lab values.  Please get a complete blood count and chemistry panel checked by your Primary MD at your next visit, and again as instructed by your Primary MD.  Get Medicines reviewed and adjusted: Please take all your medications with you for your next visit with your Primary MD  Laboratory/radiological data: Please request your Primary MD to go over all hospital tests and procedure/radiological results at the follow up, please ask your primary care provider to get all Hospital records sent to his/her office.  In some cases, they will be blood work, cultures and biopsy results pending at the time of your discharge. Please  request that your primary care provider follow up on these results.  If you are diabetic, please bring your blood sugar readings with you to your follow up appointment with primary care.    Please call and make your follow up appointments as soon as possible.    Also Note the following: If you experience worsening of your admission symptoms, develop shortness of breath, life threatening emergency, suicidal or homicidal thoughts you must seek medical attention immediately by calling 911 or calling your MD immediately  if symptoms less severe.  You must read complete  instructions/literature along with all the possible adverse reactions/side effects for all the Medicines you take and that have been prescribed to you. Take any new Medicines after you have completely understood and accpet all the possible adverse reactions/side effects.   Do not drive when taking Pain medications or sleeping medications (Benzodiazepines)  Do not take more than prescribed Pain, Sleep and Anxiety Medications. It is not advisable to combine anxiety,sleep and pain medications without talking with your primary care practitioner  Special Instructions: If you have smoked or chewed Tobacco  in the last 2 yrs please stop smoking, stop any regular Alcohol   and or any Recreational drug use.  Wear Seat belts while driving.  Do not drive if taking any narcotic, mind altering or controlled substances or recreational drugs or alcohol .

## 2023-12-29 NOTE — Consult Note (Signed)
 Consultation Note Date: 12/29/2023   Patient Name: Clayton Austin  DOB: 09/21/1940  MRN: [id number]  Age / Sex: 83 y.o., male  PCP: Carlette Benita Area, MD Referring Physician: Willette Adriana LABOR, MD  Reason for Consultation: Establishing goals of care  HPI/Patient Profile: 83 y.o. male  with past medical history of BPH, CAD status post MI and CABG x 5, ischemic heart disease, recurrent urethral stricture, right bundle branch block, hypothyroidism, hypertension, hyperlipidemia, GERD, and paroxysmal A-fib admitted on 12/25/2023 with suprapubic pain, difficulty urinating, and hematuria.   Workup revealed ureteral stones which contributed to the development of urosepsis.  Foley catheter placed.  He is on IV fluids and IV antibiotics.  He has had significant hematuria and has required several blood transfusions.  Hospital course also complicated by some acute renal insufficiency.  Urology is following.  PMT has been consulted to assist with goals of care conversation.  Today, labs and diagnostics independently reviewed.  Some mild progressive hyponatremia noted.  Would recommend continuing to monitor.  Renal function stable/improving with creatinine today 0.95 compared to creatinine of 2.82 on 12/25/2023.  Progressive hypocalcemia in the setting of hypoalbuminemia noted corrected calcium  9.2 mg/dL.  Total protein and albumin  levels are 5.0 and 2.2 respectively.  Lactate elevated on 12/27/2023.  CBC reviewed.  Continues to have mildly elevated white blood cell count although improving from previous.  Hemoglobin trending down with H&H of 9.3 g/dL / 71% on 9/9, hemoglobin 9.9 on 12/28/2023.  Hemoglobin was 10.2 posttransfusion on 9/7. Monitor. May need another blood transfusion. Urine culture negative to date.  CT abdomen and pelvis from 12/25/2023 independently reviewed.  Urinary bladder appears significantly enlarged.  Large right inguinal hernia also noted.  EKG also independently  reviewed consistent with right bundle branch block which patient has history of as well as sinus rhythm.  Vital signs reviewed and appear relatively stable.  Some mild bradycardia noted.  Medication administration record reviewed.  Patient has required 0.5 mg of IV Dilaudid  on 24-hour look back.  Otherwise, no symptom meds needed in past 24 hours.  Appears to have tolerated Dilaudid  well and denies any pain currently.  Today, patient states he is doing okay. He states he is tired but denies any other concerns at this time.  Independent history also obtained from nursing and patient's sister as he does appear quite drowsy and intermittently dozes off during our discussion.  No significant nursing concerns noted at present as a relates to patient.  Patient Sister provides information regarding his background and insight into his life at home.  See below.  Clinical Assessment and Goals of Care:  I have reviewed medical records including EPIC notes, labs and imaging (independently reviewed), prior specialist notes, vital signs, medication administration record, assessed the patient and then met with patient and sister to discuss diagnosis prognosis, GOC, EOL wishes, disposition and options. Collaborated directly with attending physician, bedside nursing staff, TOC.   I introduced Palliative Medicine as specialized medical care for people living with serious illness. It focuses on providing relief from the symptoms and stress of a serious illness. The goal is to improve quality of life for both the patient and the family.  We discussed a brief life review of the patient and then focused on their current illness.   I attempted to elicit values and goals of care important to the patient.    Medical History Review and Family/Patient Understanding:   Patient and sister both have a very limited understanding of patient's  current and chronic health conditions.  Patient's sister shares that he has longstanding  history of limited comprehension as it relates to medical concepts.  With permission, I discussed patient's current health condition and reason for admission. Engaged in a detailed conversation with the patient and sister about what his current acute illness means in light of his advanced age and complex medical history, and explained the potential implications this may have for his recovery and long-term well-being.  Also discussed the disease trajectory of ischemic heart disease and potential for future and ongoing declines and increased need for functional assistance going forward.  Social History:  Patient lives at home with his wife.  His sister lives nearby and is supportive.  He was born in [place]  but has lived in [place] since 1998.  Functional and Nutritional State:  He does report having some issues getting around lately.  He reports that his legs have been feeling weaker and he uses assistive devices such as walker or cane but even when he uses these devices he still feels quite weak.  He also needs assistance with getting in and out of the shower but can bathe himself once they are.  He is able to do all his other ADLs.  His wife is elderly herself and cannot do much to help.  Per the patient and his sister's reports, there appear to be some communication challenges between the patient and his wife that also may impact care.  Of note, there are also some concerns regarding patient's home and that it may not be mobility friendly.  He reports that he has about 7 stairs to enter the home and has great difficulty managing these.  He also reports decreased appetite in the weeks to months before admitting to the hospital.  Palliative Symptoms:  Decreased appetite  Advance care planning Discussion:  The patient consented to a voluntary Advance Care Planning Conversation with his sister present in person. Individuals present for the conversation: this NP, patient, and sister [name]).    A detailed discussion regarding GOC, advanced directives, and anticipatory care planning was had.  Elicited goals of care.  Patient's goal is to get better and return home.  Maintaining his current functional level and being able to be at home are very important to him.  A life where he would be completely dependent on others or permanently placed in a skilled nursing facility would not what he considers good quality of life.  Patient is willing to go to a skilled nursing facility for rehab but only wants to go to get stronger and then ultimately wishes to return home. He knows he is too weak to return home now so agreeable to STR stay when medically stable for d/c. [facility] [place] would be first choice for skilled nursing facility due to proximity to their home which will make it easier for his wife and sister to visit. Discussed community resources that may help him continue to live at home independently ie home health agencies, PACE program, etc, he will look into these options for further support once he returns home from SNF.   Discussed that our goal and hope is to help him improve and ultimately return home (after temporary SNF stay), we are worried that with his current acute illness and comorbidities that his health could decline and could decline rapidly.  Discussed the importance of having an advance care plan in place in order to ensure his wishes are being honored.  Asked if  he has ever thought about these things.  He states he has not.  He does not have any previously established advanced directives, living will, or healthcare power of attorney documentation.  I ask if he has someone that he trust to make medical decisions for him if he is unable to.  He states that his sister or his wife would be his healthcare proxy.  His wife is to be primary decision-maker with sister supporting.  He has not spoken to his wife much regarding his goals and wishes as it relates to his healthcare and things  he would or would not want if his health were to decline.  I encouraged him to do so and he said he would consider.  Offered to call wife and facilitate discussion.  Patient declined at this time.  Engaged in discussion of advance directives including the limitations and potential burdens of CPR and intubation, particularly in the context of advanced age and serious underlying health conditions.  Patient states he will consider whether or not he would want CPR and intubation but is not ready to make a decision as it relates to this at this time.  He is agreeable to discuss with sister and wife and agreeable to having follow-up tomorrow if he is available to further discuss.  Discussed the differences between hospice and Palliative Care services outpatient  as well as potential benefits, philosophy, and inclusion criteria and offered referral to outpatient palliative (he does not meet criteria for hospice at this time).  He is agreeable to outpatient palliative care follow up.   Discussed the importance of continued conversation with family and the medical providers regarding overall plan of care and treatment options, ensuring decisions are within the context of the patient's values and GOCs.   Questions and concerns were addressed. The family was encouraged to call with questions or concerns.  PMT will continue to support holistically.  Primary Decision maker and health care surrogate:  PATIENT, wife, with sister as support  I spent 30 minutes providing separately identifiable ACP services with the patient and/or surrogate decision maker in a voluntary, in-person conversation discussing the patient's wishes and goals as detailed in the above note.   Code Status:  Full code    SUMMARY OF RECOMMENDATIONS   Full code/full scope Continue aggressive medical management Patient and sister feel that SNF for short-term rehab at discharge will be appropriate. Patient interested in outpatient palliative  referral Palliative medicine team will continue to follow for ongoing goals of care discussion, symptom management, and coordination of care. Code Status/Advance Care Planning: Full code   Symptom Management:  Continue hydromorphone  0.5 mg every 4 hours as needed Continue Tylenol  every 6 hours as needed Continue Zofran  4 mg every 6 hours as needed  Prognosis:  Unable to determine  Discharge Planning: Skilled Nursing Facility for rehab with Palliative care service follow-up      Primary Diagnoses: Present on Admission:  Atrial fibrillation with RVR (HCC)  Benign essential HTN  CAD (coronary artery disease) of artery bypass graft  Benign prostatic hyperplasia with lower urinary tract symptoms  Bladder stone  Hypothyroidism  Paroxysmal atrial fibrillation (HCC)  Sepsis (HCC)    Physical Exam Constitutional:      General: He is not in acute distress.    Comments: Chronically ill appearing   Pulmonary:     Effort: Pulmonary effort is normal. No respiratory distress.  Skin:    General: Skin is warm and dry.  Neurological:  Comments: Drowsy but able to participate in conversation     Vital Signs: BP (!) 140/57   Pulse (!) 59   Temp 97.7 F (36.5 C) (Oral)   Resp 11   Ht 5' 9 (1.753 m)   Wt 67.2 kg   SpO2 100%   BMI 21.88 kg/m  Pain Scale: 0-10 POSS *See Group Information*: 1-Acceptable,Awake and alert Pain Score: 0-No pain   SpO2: SpO2: 100 % O2 Device:SpO2: 100 % O2 Flow Rate: .O2 Flow Rate (L/min): 0 L/min   Palliative Assessment/Data:50-60%     Billing based on MDM: High  Problems Addressed: One or more chronic illnesses with severe exacerbation, progression, or side effects of treatment.  Amount and/or Complexity of Data: Category 1:Assessment requiring an independent historian(s), Category 2:Independent interpretation of a test performed by another physician/other qualified health care professional (not separately reported), and Category  3:Discussion of management or test interpretation with external physician/other qualified health care professional/appropriate source (not separately reported)  Risks: Parenteral controlled substances    Kirke Breach M Gabbi Whetstone, NP  Palliative Medicine Team Team phone # [phone]  Thank you for allowing the Palliative Medicine Team to assist in the care of this patient. Please utilize secure chat with additional questions, if there is no response within 30 minutes please call the above phone number.  Palliative Medicine Team providers are available by phone from 7am to 7pm daily and can be reached through the team cell phone.  Should this patient require assistance outside of these hours, please call the patient's attending physician.

## 2023-12-29 NOTE — TOC Initial Note (Signed)
 Transition of Care [facility]) - Initial/Assessment Note    Patient Details  Name: Clayton Austin MRN: [id number] Date of Birth: [birth year]  Transition of Care [facility]) CM/SW Contact:    Sharlyne Stabs, RN Phone Number: 12/29/2023, 10:15 AM  Clinical Narrative:     Patient admitted with Sepsis. CM at the bedside to discuss PT eval, that is recommending SNF. He is agreeable and wants [facility]. CM check with [facility], they do not have a male bed. Patient ask CM to call his wife. Lola is agreeable to SNF and requested [facility]. FL2 sent out and Auth Started. TOC following.               Expected Discharge Plan: Skilled Nursing Facility Barriers to Discharge: SNF Pending bed offer, Insurance Authorization  Patient Goals and CMS Choice Patient states their goals for this hospitalization and ongoing recovery are:: agreeable to SNF CMS Medicare.gov Compare Post Acute Care list provided to:: Patient Choice offered to / list presented to : Patient [place] ownership interest in [facility].provided to:: Patient   Expected Discharge Plan and Services      Living arrangements for the past 2 months: Single Family Home                    Prior Living Arrangements/Services Living arrangements for the past 2 months: Single Family Home Lives with:: Spouse Patient language and need for interpreter reviewed:: Yes        Need for Family Participation in Patient Care: Yes (Comment) Care giver support system in place?: Yes (comment) Current home services: DME Criminal Activity/Legal Involvement Pertinent to Current Situation/Hospitalization: No - Comment as needed  Activities of Daily Living   ADL Screening (condition at time of admission) Independently performs ADLs?: Yes (appropriate for developmental age) Is the patient deaf or have difficulty hearing?: No Does the patient have difficulty seeing, even when wearing glasses/contacts?: No Does the patient have difficulty concentrating,  remembering, or making decisions?: No  Permission Sought/Granted        Permission granted to share info w Relationship: wife     Emotional Assessment     Affect (typically observed): Accepting Orientation: : Oriented to Self, Oriented to Place, Oriented to  Time, Oriented to Situation Alcohol  / Substance Use: Not Applicable Psych Involvement: No (comment)  Admission diagnosis:  Acute cystitis with hematuria [N30.01] Acute kidney injury (HCC) [N17.9] Sepsis (HCC) [A41.9] Severe sepsis (HCC) [A41.9, R65.20] Anemia, unspecified type [D64.9] Benign prostatic hyperplasia with lower urinary tract symptoms, symptom details unspecified [N40.1] Hematuria, unspecified type [R31.9] Sepsis, due to unspecified organism, unspecified whether acute organ dysfunction present [facility]) [A41.9] Patient Active Problem List   Diagnosis Date Noted   Severe sepsis (HCC) 12/25/2023   Sepsis (HCC) 12/25/2023   LVH (left ventricular hypertrophy) 07/14/2023   Former smoker 04/06/2023   Kidney stones 10/09/2022   Bladder stone 10/09/2022   Urethral diverticulum 10/09/2022   Benign essential HTN 08/03/2022   Paroxysmal atrial fibrillation (HCC) 08/03/2022   Constipation 08/02/2022   Atrial fibrillation with RVR (HCC) 08/02/2022   Decreased bladder capacity 12/09/2018   Carotid stenosis, right 07/10/2017   Bladder trabeculation 05/07/2017   Erectile dysfunction due to diseases classified elsewhere 05/07/2017   Nocturia 05/07/2017   Postprocedural anterior urethral stricture 05/07/2017   Anemia 09/17/2012   Renal insufficiency 09/17/2012   Hypothyroidism 09/14/2012   Edema 08/29/2012   Hiccup 08/29/2012   HLD (hyperlipidemia) 08/26/2012   HTN (hypertension) 08/26/2012   Benign prostatic  hyperplasia with lower urinary tract symptoms 08/26/2012   CAD (coronary artery disease) of artery bypass graft 08/24/2012   PCP:  Carlette Benita Area, MD Pharmacy:   [facility] - [place], [place] - [address] [place] [address] Phone: [phone] Fax: [phone]     Social Drivers of Health (SDOH) Social History: SDOH Screenings   Food Insecurity: No Food Insecurity (12/25/2023)  Housing: Low Risk  (12/25/2023)  Transportation Needs: No Transportation Needs (12/25/2023)  Utilities: Not At Risk (12/25/2023)  Financial Resource Strain: Low Risk  (08/28/2022)   Received from [facility]  Social Connections: Moderately Isolated (12/25/2023)  Stress: No Stress Concern Present (08/03/2022)   Received from [facility]  Tobacco Use: Medium Risk (12/25/2023)  Health Literacy: Low Risk  (08/16/2020)   Received from [facility]   SDOH Interventions:    Readmission Risk Interventions     No data to display

## 2023-12-30 DIAGNOSIS — I4891 Unspecified atrial fibrillation: Secondary | ICD-10-CM

## 2023-12-30 DIAGNOSIS — R310 Gross hematuria: Secondary | ICD-10-CM

## 2023-12-30 DIAGNOSIS — Z79899 Other long term (current) drug therapy: Secondary | ICD-10-CM | POA: Diagnosis not present

## 2023-12-30 DIAGNOSIS — A419 Sepsis, unspecified organism: Secondary | ICD-10-CM | POA: Diagnosis not present

## 2023-12-30 DIAGNOSIS — Z515 Encounter for palliative care: Secondary | ICD-10-CM | POA: Diagnosis not present

## 2023-12-30 DIAGNOSIS — E039 Hypothyroidism, unspecified: Secondary | ICD-10-CM

## 2023-12-30 DIAGNOSIS — Z7189 Other specified counseling: Secondary | ICD-10-CM | POA: Diagnosis not present

## 2023-12-30 DIAGNOSIS — E440 Moderate protein-calorie malnutrition: Secondary | ICD-10-CM | POA: Diagnosis not present

## 2023-12-30 LAB — CBC
HCT: 29.6 % — ABNORMAL LOW (ref 39.0–52.0)
Hemoglobin: 9.6 g/dL — ABNORMAL LOW (ref 13.0–17.0)
MCH: 30.7 pg (ref 26.0–34.0)
MCHC: 32.4 g/dL (ref 30.0–36.0)
MCV: 94.6 fL (ref 80.0–100.0)
Platelets: 286 K/uL (ref 150–400)
RBC: 3.13 MIL/uL — ABNORMAL LOW (ref 4.22–5.81)
RDW: 14.6 % (ref 11.5–15.5)
WBC: 11.8 K/uL — ABNORMAL HIGH (ref 4.0–10.5)
nRBC: 0 % (ref 0.0–0.2)

## 2023-12-30 MED ORDER — CHLORHEXIDINE GLUCONATE CLOTH 2 % EX PADS
6.0000 | MEDICATED_PAD | Freq: Every day | CUTANEOUS | Status: DC
  Administered 2023-12-30 – 2023-12-31 (×2): 6 via TOPICAL

## 2023-12-30 MED ORDER — LACTULOSE 10 GM/15ML PO SOLN: 20.0000 g | Freq: Every day | ORAL | Status: DC | PRN

## 2023-12-30 NOTE — Patient Instructions (Signed)

## 2023-12-30 NOTE — Progress Notes (Signed)
 PROGRESS NOTE   Clayton Austin  FMW:[id number] DOB: 1940-07-15 DOA: 12/25/2023 PCP: Fanta, Tesfaye Demissie, MD   Chief Complaint  Patient presents with   Fall   Level of care: Telemetry  Brief Admission History:  Clayton Austin is a 83 y.o. male with medical history significant of hypertension, hypothyroidism, hyperlipidemia, coronary artery disease with history of CABG x 5 vessels in and NSTEMI in 2014, paroxysmal atrial fibrillation on anticoagulation.  Patient has been having difficulty urinating for the past 3 to 4 days.   Around the same time, he has noticed some hematuria.  Per his recollection, this has never happened before. He came to the hospital he came to the hospital due to suprapubic pain and difficulty urinating.  No fevers or chills although last night he got weak and fell, striking his head.  Due to inability to urinate, urology came and placed a Foley catheter.    Per EDP 7 ureteral stones were present which were pushed back into the bladder.  [name] was noted.  Urology, Dr. Sherrilee.    Assessment and Plan:  Urosepsis sepsis  -During day 12/27/23 -patient was found hypothermic, mildly hypotensive-transferred to stepdown unit for close observation - Status post 2U PRBC transfusion, and bolus LR -currently hemodynamically stable, mildly hypotensive with a BP of 102/46, temp 97.6, satting 100% - Overall improving SIRS physiology  -Continue current antibiotics  -Hypertensive otherwise hemodynamically stable this morning  -Status post Foley catheter placement-noted light bloody urine  -Will monitor closely  -Will follow-up with cultures  Hematuria with ureteral obstruction due to ureteral stone -Due to UTI ureter stones -Foley catheter in place -Monitoring closely, continue IVF IV antibiotics -resume apixaban 01/01/24  Acute on chronic anemia exacerbated by hematuria -POA hemoglobin 6.1>> 7.6, 6.9>>> 9.89  -Monitoring H&H - Holding anticoagulation -  resume apixaban 01/01/24  - S/p 1U PRBC transfusion on this admission - 12/27/2023, pursuing with 2U PRBC blood transfusion     Latest Ref Rng & Units 12/28/2023    4:26 AM 12/27/2023    8:44 PM 12/27/2023    6:51 AM  CBC  WBC 4.0 - 10.5 K/uL 11.5     Hemoglobin 13.0 - 17.0 g/dL 9.9  89.7  6.9   Hematocrit 39.0 - 52.0 % 29.0  30.3  20.6   Platelets 150 - 400 K/uL 250       Acute renal insufficiency  - Ruling out CKD-improving BUN/creatinine - Exacerbated by nephrolithiasis, UTI - Foley catheter in place -Monitor kidney function closely - Avoid nephrotoxins, hypotension Lab Results  Component Value Date   CREATININE 0.95 12/29/2023   CREATININE 1.26 (H) 12/28/2023   CREATININE 1.41 (H) 12/27/2023   CAD (coronary artery disease) of artery bypass graft - Continue home medication, holding anticoagulation  Hypothyroidism - Continue Synthroid    Benign prostatic hyperplasia with weak urinary stream -Monitoring, Foley catheter currently in place -Continue finasteride ,  -Follow-up with urologist  Benign essential HTN - hypotensive - BP soft, holding Norvasc , losartan   Bladder stone -management as above Foley catheter in place  Paroxysmal atrial fibrillation -resume Eliquis, continue metoprolol   DVT prophylaxis: SCDs Code Status: Full  Family Communication:  Disposition: SNF   Consultants:  Urology  Procedures:   Antimicrobials:    Subjective: Pt without specific complaints. Hematuria resolving.   Objective: Vitals:   12/29/23 1926 12/30/23 0000 12/30/23 0406 12/30/23 1415  BP: (!) 112/53 (!) 101/50 (!) 111/58 (!) 141/65  Pulse: 70 74 82 83  Resp: 18 18 18  Temp: 98 F (36.7 C) 98.7 F (37.1 C) 98.4 F (36.9 C) 98 F (36.7 C)  TempSrc: Oral Oral Oral Oral  SpO2: 98% 97% 97% 99%  Weight:      Height:        Intake/Output Summary (Last 24 hours) at 12/30/2023 1451 Last data filed at 12/30/2023 0408 Gross per 24 hour  Intake --  Output 500 ml  Net -500 ml    Filed Weights   12/25/23 2115 12/27/23 1508 12/28/23 0400  Weight: 59.3 kg 64.9 kg 67.2 kg   Examination:  General exam: Appears calm and comfortable  Respiratory system: Clear to auscultation. Respiratory effort normal. Cardiovascular system: normal S1 & S2 heard. No JVD, murmurs, rubs, gallops or clicks. No pedal edema. Gastrointestinal system: Abdomen is nondistended, soft and nontender. No organomegaly or masses felt. Normal bowel sounds heard. Central nervous system: Alert and oriented. No focal neurological deficits. Extremities: Symmetric 5 x 5 power. Skin: No rashes, lesions or ulcers. Psychiatry: Judgement and insight appear normal. Mood & affect appropriate.   Data Reviewed: I have personally reviewed following labs and imaging studies  CBC: Recent Labs  Lab 12/25/23 1553 12/26/23 0036 12/26/23 0456 12/27/23 0423 12/27/23 9348 12/27/23 2044 12/28/23 0426 12/29/23 0528 12/30/23 0457  WBC 12.0*  --  11.1* 12.5*  --   --  11.5* 11.4* 11.8*  NEUTROABS 9.6*  --   --   --   --   --   --   --   --   HGB 6.1*   < > 7.6* 6.9* 6.9* 10.2* 9.9* 9.3* 9.6*  HCT 19.9*   < > 22.1* 20.4* 20.6* 30.3* 29.0* 28.0* 29.6*  MCV 99.5  --  92.1 94.9  --   --  90.9 92.4 94.6  PLT 268  --  239 239  --   --  250 263 286   < > = values in this interval not displayed.    Basic Metabolic Panel: Recent Labs  Lab 12/25/23 1553 12/25/23 1941 12/26/23 0456 12/27/23 0423 12/28/23 0426 12/29/23 0528  NA 132* 131* 134* 135 134* 133*  K 4.8 4.3 4.6 4.1 4.0 4.0  CL 98 100 104 106 108 103  CO2 17* 17* 23 24 24 25   GLUCOSE 113* 100* 102* 108* 86 88  BUN 48* 43* 38* 26* 25* 22  CREATININE 2.82* 2.40* 2.01* 1.41* 1.26* 0.95  CALCIUM  9.0 8.5* 8.5* 8.1* 8.0* 7.8*  MG 2.1  --   --   --   --   --     CBG: Recent Labs  Lab 12/27/23 1438  GLUCAP 111*    Recent Results (from the past 240 hours)  Urine Culture     Status: None   Collection Time: 12/25/23  1:49 PM   Specimen: Urine,  Clean Catch  Result Value Ref Range Status   Specimen Description   Final    URINE, CLEAN CATCH Performed at [facility], [address] [place], [place] [address]    Special Requests   Final    NONE Performed at [facility], [address] [place], [place] [address]    Culture   Final    NO GROWTH Performed at [facility], [address] [place], [place] [address]    Report Status 12/26/2023 FINAL  Final  Culture, blood (Routine X 2) w Reflex to ID Panel     Status: None (Preliminary result)   Collection Time: 12/27/23  2:35 PM  Specimen: BLOOD  Result Value Ref Range Status   Specimen Description BLOOD BLOOD LEFT ARM  Final   Special Requests   Final    BOTTLES DRAWN AEROBIC AND ANAEROBIC Blood Culture adequate volume   Culture   Final    NO GROWTH 3 DAYS Performed at [facility], [address], [place], [place] [address]    Report Status PENDING  Incomplete  MRSA Next Gen by PCR, Nasal     Status: None   Collection Time: 12/27/23  3:02 PM   Specimen: Nasal Mucosa; Nasal Swab  Result Value Ref Range Status   MRSA by PCR Next Gen NOT DETECTED NOT DETECTED Final    Comment: (NOTE) The GeneXpert MRSA Assay (FDA approved for NASAL specimens only), is one component of a comprehensive MRSA colonization surveillance program. It is not intended to diagnose MRSA infection nor to guide or monitor treatment for MRSA infections. Test performance is not FDA approved in patients less than 67 years old. Performed at [facility], [address], [place], [place] [address]   Culture, blood (Routine X 2) w Reflex to ID Panel     Status: None (Preliminary result)   Collection Time: 12/27/23  4:38 PM   Specimen: BLOOD  Result Value Ref Range Status   Specimen Description BLOOD LEFT ANTECUBITAL  Final   Special Requests   Final    BOTTLES DRAWN AEROBIC AND ANAEROBIC Blood Culture adequate volume   Culture   Final    NO GROWTH 3 DAYS Performed at [facility], [address], [place], [place] [address]    Report Status PENDING  Incomplete     Radiology Studies: No results found.  Scheduled Meds:  atorvastatin   20 mg Oral q morning   Chlorhexidine  Gluconate Cloth  6 each Topical Q0600   ciprofloxacin   500 mg Oral BID   feeding supplement  237 mL Oral TID BM   finasteride   5 mg Oral Daily   lactobacillus  1 g Oral TID WC   levothyroxine   100 mcg Oral Q0600   melatonin  6 mg Oral QHS   metoprolol  tartrate  12.5 mg Oral BID   multivitamin with minerals  1 tablet Oral Daily   pantoprazole   40 mg Oral Daily   sennosides  10 mL Oral BID   tamsulosin   0.4 mg Oral BID   Tdap  0.5 mL Intramuscular Once   Continuous Infusions:   LOS: 5 days   Time spent: 54 mins  Clayton Kittel Vicci, MD How to contact the [facility] Attending or Consulting provider 7A - 7P or covering provider during after hours 7P -7A, for this patient?  Check the care team in [facility] and look for a) attending/consulting TR[facility] provider listed and b) the TR[facility] team listed Log into [email or web address] to find provider on call.  Locate the [facility] provider you are looking for under [facility] and page to a number that you can be directly reached. If you still have difficulty reaching the provider, please page the [facility] (Director on Call) for the Hospitalists listed on amion for assistance.  12/30/2023, 2:51 PM

## 2023-12-30 NOTE — Progress Notes (Signed)
 Physical Therapy Treatment Patient Details Name: Clayton Austin MRN: [id number] DOB: August 02, 1940 Today's Date: 12/30/2023   History of Present Illness Clayton Austin is a 83 y.o. male with medical history significant of hypertension, hypothyroidism, hyperlipidemia, coronary artery disease with history of CABG x 5 vessels in and NSTEMI in 2014, paroxysmal atrial fibrillation on anticoagulation.  Patient has been having difficulty urinating for the past 3 to 4 days.  Around the same time, he has noticed some hematuria.  Per his recollection, this has never happened before. He came to the hospital he came to the hospital due to suprapubic pain and difficulty urinating.  No fevers or chills although last night he got weak and fell, striking his head. No confusion or neurodeficits following that episode.  Due to inability to urinate, urology came and placed a Foley catheter.  Per EDP 7 ureteral stones were present which were pushed back into the bladder.  [name] was noted.    PT Comments  Patient demonstrates slow labored movement for sitting up at bedside, fair return for completing BLE ROM/strengthening exercises while seated at bedside requiring verbal cueing and demonstration and increased distance/tolerance for taking steps in room. Patient limited mostly due to c/o fatigue and discomfort in hips. Patient tolerated sitting up in chair after therapy. Patient will benefit from continued skilled physical therapy in hospital and recommended venue below to increase strength, balance, endurance for safe ADLs and gait.        If plan is discharge home, recommend the following: A lot of help with bathing/dressing/bathroom;A lot of help with walking and/or transfers;Help with stairs or ramp for entrance;Assist for transportation   Can travel by private vehicle     Yes  Equipment Recommendations  None recommended by PT    Recommendations for Other Services       Precautions / Restrictions  Precautions Precautions: Fall Recall of Precautions/Restrictions: Intact Restrictions Weight Bearing Restrictions Per Provider Order: No     Mobility  Bed Mobility Overal bed mobility: Needs Assistance Bed Mobility: Supine to Sit     Supine to sit: Mod assist     General bed mobility comments: slow labored movement    Transfers Overall transfer level: Needs assistance Equipment used: Rolling walker (2 wheels) Transfers: Sit to/from Stand, Bed to chair/wheelchair/BSC Sit to Stand: Mod assist   Step pivot transfers: Mod assist       General transfer comment: had diffiuclty completing sit to stands due to BLE weakness    Ambulation/Gait Ambulation/Gait assistance: Mod assist Gait Distance (Feet): 12 Feet Assistive device: Rolling walker (2 wheels) Gait Pattern/deviations: Decreased step length - left, Decreased stance time - right, Decreased stride length, Trunk flexed, Scissoring Gait velocity: decreased     General Gait Details: slow labored movement requring increased time for making turns, audible bone on bone contact at hip jonts, limited mostly due to c/o fatigue and discomfort in hips   Stairs             Wheelchair Mobility     Tilt Bed    Modified Rankin (Stroke Patients Only)       Balance Overall balance assessment: Needs assistance Sitting-balance support: Feet supported, No upper extremity supported Sitting balance-Leahy Scale: Fair Sitting balance - Comments: fair/good seated at EOB   Standing balance support: Bilateral upper extremity supported, During functional activity, Reliant on assistive device for balance Standing balance-Leahy Scale: Poor Standing balance comment: fair/poor using RW  Communication Communication Communication: No apparent difficulties  Cognition Arousal: Alert Behavior During Therapy: WFL for tasks assessed/performed   PT - Cognitive impairments: No apparent  impairments                         Following commands: Intact      Cueing Cueing Techniques: Verbal cues, Tactile cues  Exercises General Exercises - Lower Extremity Long Arc Quad: Seated, AROM, Strengthening, Both, 10 reps Hip Flexion/Marching: Seated, AROM, Strengthening, Both, 10 reps Toe Raises: Seated, AROM, Strengthening, Both, 10 reps Heel Raises: Seated, AROM, Strengthening, Both, 10 reps    General Comments        Pertinent Vitals/Pain Pain Assessment Pain Assessment: Faces Faces Pain Scale: Hurts a little bit Pain Location: right hip Pain Descriptors / Indicators: Discomfort, Sore Pain Intervention(s): Limited activity within patient's tolerance, Monitored during session, Repositioned    Home Living                          Prior Function            PT Goals (current goals can now be found in the care plan section) Acute Rehab PT Goals Patient Stated Goal: return home after rehab PT Goal Formulation: With patient Time For Goal Achievement: 01/11/24 Potential to Achieve Goals: Good Progress towards PT goals: Progressing toward goals    Frequency    Min 3X/week      PT Plan      Co-evaluation              AM-PAC PT 6 Clicks Mobility   Outcome Measure  Help needed turning from your back to your side while in a flat bed without using bedrails?: A Little Help needed moving from lying on your back to sitting on the side of a flat bed without using bedrails?: A Lot Help needed moving to and from a bed to a chair (including a wheelchair)?: A Lot Help needed standing up from a chair using your arms (e.g., wheelchair or bedside chair)?: A Lot Help needed to walk in hospital room?: A Lot Help needed climbing 3-5 steps with a railing? : A Lot 6 Click Score: 13    End of Session   Activity Tolerance: Patient tolerated treatment well;Patient limited by fatigue Patient left: in chair;with call bell/phone within reach;with  chair alarm set Nurse Communication: Mobility status PT Visit Diagnosis: Unsteadiness on feet (R26.81);Other abnormalities of gait and mobility (R26.89);Muscle weakness (generalized) (M62.81)     Time: 8545-8485 PT Time Calculation (min) (ACUTE ONLY): 20 min  Charges:    $Therapeutic Exercise: 8-22 mins $Therapeutic Activity: 8-22 mins PT General Charges $$ ACUTE PT VISIT: 1 Visit                     3:40 PM, 12/30/23 Lynwood Music, MPT Physical Therapist with [facility] [phone] office [phone] mobile phone

## 2023-12-30 NOTE — Progress Notes (Signed)
                                                                                                                                                          Daily Progress Note   Patient Name: Clayton Austin       Date: 12/30/2023 DOB: [birth year]  Age: 83 y.o. MRN#: [id number] Attending Physician: Vicci Afton CROME, MD Primary Care Physician: Carlette Benita Area, MD Admit Date: 12/25/2023  Reason for Consultation/Follow-up: {Reason for Consult:23484}  Subjective: ***  Today, ***  Chart review/care coordination:  Completed extensive chart review including EPIC notes, ***. Coordinated care with ****.    Length of Stay: 5   Physical Exam          Vital Signs: BP (!) 141/65 (BP Location: Right Arm)   Pulse 83   Temp 98 F (36.7 C) (Oral)   Resp 18   Ht 5' 9 (1.753 m)   Wt 67.2 kg   SpO2 99%   BMI 21.88 kg/m  SpO2: SpO2: 99 % O2 Device: O2 Device: Room Air O2 Flow Rate: O2 Flow Rate (L/min): 0 L/min      Palliative Assessment/Data:   Palliative Care Assessment & Plan   Patient Profile/Assessment:  ***   Recommendations/Plan: ***   Symptom management:  ***  Prognosis:  {Palliative Care Prognosis:23504}    Discharge Planning: {Palliative dispostion:23505}    Detailed review of medical records (labs, imaging, vital signs), medically appropriate exam, discussed with treatment team, counseling and education to patient, family, & staff, documenting clinical information, medication management, coordination of care   Total time: I spent *** minutes in the care of the patient today in the above activities and documenting the encounter.   Billing based on MDM: ***  {Problems Addressed:304933}  {Amount and/or Complexity of Ijuj:695065}  {Risks:304936}         Clayton CHRISTELLA Pinal, NP  Palliative Medicine Team Team phone # [phone]  Thank you for allowing the Palliative Medicine Team to assist in the care of this patient. Please utilize  secure chat with additional questions, if there is no response within 30 minutes please call the above phone number.  Palliative Medicine Team providers are available by phone from 7am to 7pm daily and can be reached through the team cell phone.  Should this patient require assistance outside of these hours, please call the patient's attending physician.

## 2023-12-30 NOTE — Progress Notes (Signed)
 Occupational Therapy Treatment Patient Details Name: Clayton Austin MRN: [id number] DOB: 05-Jul-1940 Today's Date: 12/30/2023   History of present illness Clayton Austin is a 83 y.o. male with medical history significant of hypertension, hypothyroidism, hyperlipidemia, coronary artery disease with history of CABG x 5 vessels in and NSTEMI in 2014, paroxysmal atrial fibrillation on anticoagulation.  Patient has been having difficulty urinating for the past 3 to 4 days.  Around the same time, he has noticed some hematuria.  Per his recollection, this has never happened before. He came to the hospital he came to the hospital due to suprapubic pain and difficulty urinating.  No fevers or chills although last night he got weak and fell, striking his head. No confusion or neurodeficits following that episode.  Due to inability to urinate, urology came and placed a Foley catheter.  Per EDP 7 ureteral stones were present which were pushed back into the bladder.  [name] was noted.   OT comments  Pt agreeable to OT treatment today. Pt required mod A for bed mobility with labored movement. Pt then completed EOB to chair with RW transfer needing mod A and extended time. Pt did ambulate a short distance in the room with min A to mod A when ambulating in a straight line. Pt then tolerated B UE AA/ROM due to assist to reach end range of movements above shoulder height. Pt also fatigued and needed cuing for horizontal abduction. Pt given HEP to continue in the room to improve B UE A/ROM and strength. Pt left in the chair with call bell within reach. Pt will benefit from continued OT in the hospital to increase strength, balance, and endurance for safe ADL's.          If plan is discharge home, recommend the following:  A lot of help with bathing/dressing/bathroom;Assistance with cooking/housework;A little help with walking and/or transfers;Assist for transportation;Help with stairs or ramp for entrance    Equipment Recommendations  None recommended by OT          Precautions / Restrictions Precautions Precautions: Fall Recall of Precautions/Restrictions: Intact Restrictions Weight Bearing Restrictions Per Provider Order: No       Mobility Bed Mobility Overal bed mobility: Needs Assistance Bed Mobility: Supine to Sit     Supine to sit: Mod assist     General bed mobility comments: slow labored movement    Transfers Overall transfer level: Needs assistance Equipment used: Rolling walker (2 wheels) Transfers: Sit to/from Stand, Bed to chair/wheelchair/BSC Sit to Stand: Mod assist     Step pivot transfers: Mod assist     General transfer comment: unsteady labored movement; assist to manage RW; extended time needed; EOB to chair with RW     Balance Overall balance assessment: Needs assistance Sitting-balance support: Feet supported, No upper extremity supported Sitting balance-Leahy Scale: Fair Sitting balance - Comments: seated at EOB   Standing balance support: Bilateral upper extremity supported, During functional activity, Reliant on assistive device for balance Standing balance-Leahy Scale: Poor Standing balance comment: using RW                           ADL either performed or assessed with clinical judgement   ADL Overall ADL's : Needs assistance/impaired                                     Functional  mobility during ADLs: Minimal assistance;Moderate assistance;Rolling walker (2 wheels) General ADL Comments: Pt ambulated ~8 feet tototal in the room with RW forward and backward.    Extremity/Trunk Assessment Upper Extremity Assessment Upper Extremity Assessment: Generalized weakness (Limited by arthritis as well as weakness.)                             Communication Communication Communication: No apparent difficulties   Cognition Arousal: Alert Behavior During Therapy: WFL for tasks  assessed/performed Cognition: No apparent impairments                               Following commands: Intact        Cueing   Cueing Techniques: Verbal cues, Tactile cues, Visual cues  Exercises Exercises: General Upper Extremity General Exercises - Upper Extremity Shoulder Flexion: AAROM, 5 reps, Both, Seated Shoulder ABduction: AAROM, Both, 5 reps, Seated Shoulder Horizontal ABduction: AAROM, Both, 5 reps, Seated (x10 protraction and external rotation of A/ROM seated with  B UE.)                 Pertinent Vitals/ Pain       Pain Assessment Pain Assessment: No/denies pain                                                          Frequency  Min 3X/week        Progress Toward Goals  OT Goals(current goals can now be found in the care plan section)  Progress towards OT goals: Progressing toward goals  Acute Rehab OT Goals Patient Stated Goal: improve function OT Goal Formulation: With patient/family Time For Goal Achievement: 01/11/24 Potential to Achieve Goals: Good ADL Goals Pt Will Perform Grooming: with modified independence Pt Will Perform Upper Body Dressing: with set-up;sitting Pt Will Perform Lower Body Dressing: with mod assist;sitting/lateral leans;with adaptive equipment Pt Will Perform Toileting - Clothing Manipulation and hygiene: with mod assist;sitting/lateral leans;with adaptive equipment Pt/caregiver will Perform Home Exercise Program: Increased ROM;Increased strength;Both right and left upper extremity;Independently  Plan                                      End of Session Equipment Utilized During Treatment: Rolling walker (2 wheels);Gait belt  OT Visit Diagnosis: Unsteadiness on feet (R26.81);Other abnormalities of gait and mobility (R26.89);Muscle weakness (generalized) (M62.81)   Activity Tolerance Patient tolerated treatment well   Patient Left in chair;with call bell/phone within  reach   Nurse Communication          Time: 8942-8872 OT Time Calculation (min): 30 min  Charges: OT General Charges $OT Visit: 1 Visit OT Treatments $Therapeutic Exercise: 23-37 mins  Tymira Horkey OT, MOT  Jayson Person 12/30/2023, 1:15 PM

## 2023-12-30 NOTE — Plan of Care (Signed)

## 2023-12-30 NOTE — Care Management Important Message (Signed)
 Important Message  Patient Details  Name: Clayton Austin MRN: [id number] Date of Birth: 1941/02/27   Important Message Given:  Yes - Medicare IM     Murriel Eidem L Kahmari Herard 12/30/2023, 12:51 PM

## 2023-12-31 DIAGNOSIS — N210 Calculus in bladder: Secondary | ICD-10-CM | POA: Diagnosis not present

## 2023-12-31 DIAGNOSIS — I517 Cardiomegaly: Secondary | ICD-10-CM | POA: Diagnosis not present

## 2023-12-31 DIAGNOSIS — A419 Sepsis, unspecified organism: Secondary | ICD-10-CM | POA: Diagnosis not present

## 2023-12-31 DIAGNOSIS — I4891 Unspecified atrial fibrillation: Secondary | ICD-10-CM | POA: Diagnosis not present

## 2023-12-31 DIAGNOSIS — E441 Mild protein-calorie malnutrition: Secondary | ICD-10-CM | POA: Diagnosis not present

## 2023-12-31 DIAGNOSIS — R2689 Other abnormalities of gait and mobility: Secondary | ICD-10-CM | POA: Diagnosis not present

## 2023-12-31 DIAGNOSIS — N361 Urethral diverticulum: Secondary | ICD-10-CM | POA: Diagnosis not present

## 2023-12-31 DIAGNOSIS — M79641 Pain in right hand: Secondary | ICD-10-CM | POA: Diagnosis not present

## 2023-12-31 DIAGNOSIS — I6782 Cerebral ischemia: Secondary | ICD-10-CM | POA: Diagnosis not present

## 2023-12-31 DIAGNOSIS — R5383 Other fatigue: Secondary | ICD-10-CM | POA: Diagnosis not present

## 2023-12-31 DIAGNOSIS — N3289 Other specified disorders of bladder: Secondary | ICD-10-CM | POA: Diagnosis not present

## 2023-12-31 DIAGNOSIS — E039 Hypothyroidism, unspecified: Secondary | ICD-10-CM | POA: Diagnosis not present

## 2023-12-31 DIAGNOSIS — M79642 Pain in left hand: Secondary | ICD-10-CM | POA: Diagnosis not present

## 2023-12-31 DIAGNOSIS — M6281 Muscle weakness (generalized): Secondary | ICD-10-CM | POA: Diagnosis not present

## 2023-12-31 DIAGNOSIS — E785 Hyperlipidemia, unspecified: Secondary | ICD-10-CM | POA: Diagnosis not present

## 2023-12-31 DIAGNOSIS — R1310 Dysphagia, unspecified: Secondary | ICD-10-CM | POA: Diagnosis not present

## 2023-12-31 DIAGNOSIS — I10 Essential (primary) hypertension: Secondary | ICD-10-CM

## 2023-12-31 DIAGNOSIS — R5381 Other malaise: Secondary | ICD-10-CM | POA: Diagnosis not present

## 2023-12-31 DIAGNOSIS — R6520 Severe sepsis without septic shock: Secondary | ICD-10-CM | POA: Diagnosis not present

## 2023-12-31 DIAGNOSIS — N179 Acute kidney failure, unspecified: Secondary | ICD-10-CM | POA: Diagnosis not present

## 2023-12-31 DIAGNOSIS — N200 Calculus of kidney: Secondary | ICD-10-CM | POA: Diagnosis not present

## 2023-12-31 DIAGNOSIS — D509 Iron deficiency anemia, unspecified: Secondary | ICD-10-CM | POA: Diagnosis not present

## 2023-12-31 DIAGNOSIS — D649 Anemia, unspecified: Secondary | ICD-10-CM | POA: Diagnosis not present

## 2023-12-31 DIAGNOSIS — H353131 Nonexudative age-related macular degeneration, bilateral, early dry stage: Secondary | ICD-10-CM | POA: Diagnosis not present

## 2023-12-31 DIAGNOSIS — I25810 Atherosclerosis of coronary artery bypass graft(s) without angina pectoris: Secondary | ICD-10-CM | POA: Diagnosis not present

## 2023-12-31 DIAGNOSIS — Z515 Encounter for palliative care: Secondary | ICD-10-CM | POA: Diagnosis not present

## 2023-12-31 DIAGNOSIS — R488 Other symbolic dysfunctions: Secondary | ICD-10-CM | POA: Diagnosis not present

## 2023-12-31 DIAGNOSIS — M6289 Other specified disorders of muscle: Secondary | ICD-10-CM | POA: Diagnosis not present

## 2023-12-31 DIAGNOSIS — K219 Gastro-esophageal reflux disease without esophagitis: Secondary | ICD-10-CM | POA: Diagnosis not present

## 2023-12-31 DIAGNOSIS — N139 Obstructive and reflux uropathy, unspecified: Secondary | ICD-10-CM | POA: Diagnosis not present

## 2023-12-31 DIAGNOSIS — R278 Other lack of coordination: Secondary | ICD-10-CM | POA: Diagnosis not present

## 2023-12-31 DIAGNOSIS — E440 Moderate protein-calorie malnutrition: Secondary | ICD-10-CM | POA: Diagnosis not present

## 2023-12-31 DIAGNOSIS — I2510 Atherosclerotic heart disease of native coronary artery without angina pectoris: Secondary | ICD-10-CM | POA: Diagnosis not present

## 2023-12-31 DIAGNOSIS — Z87440 Personal history of urinary (tract) infections: Secondary | ICD-10-CM | POA: Diagnosis not present

## 2023-12-31 LAB — CBC
HCT: 29.5 % — ABNORMAL LOW (ref 39.0–52.0)
Hemoglobin: 9.6 g/dL — ABNORMAL LOW (ref 13.0–17.0)
MCH: 31.1 pg (ref 26.0–34.0)
MCHC: 32.5 g/dL (ref 30.0–36.0)
MCV: 95.5 fL (ref 80.0–100.0)
Platelets: 283 K/uL (ref 150–400)
RBC: 3.09 MIL/uL — ABNORMAL LOW (ref 4.22–5.81)
RDW: 14.4 % (ref 11.5–15.5)
WBC: 12 K/uL — ABNORMAL HIGH (ref 4.0–10.5)
nRBC: 0 % (ref 0.0–0.2)

## 2023-12-31 MED ORDER — METOPROLOL TARTRATE 25 MG PO TABS: 12.5000 mg | ORAL_TABLET | Freq: Two times a day (BID) | ORAL | Status: AC

## 2023-12-31 MED ORDER — FLORANEX PO PACK: 1.0000 g | PACK | Freq: Three times a day (TID) | ORAL | Status: AC

## 2023-12-31 MED ORDER — ADULT MULTIVITAMIN W/MINERALS CH: 1.0000 | ORAL_TABLET | Freq: Every day | ORAL | Status: DC

## 2023-12-31 MED ORDER — ENSURE PLUS HIGH PROTEIN PO LIQD: 237.0000 mL | Freq: Three times a day (TID) | ORAL | Status: AC

## 2023-12-31 NOTE — TOC Progression Note (Signed)
 Transition of Care [facility]) - Progression Note    Patient Details  Name: Clayton Austin MRN: [id number] Date of Birth: 1940/06/26  Transition of Care [facility]) CM/SW Contact  Sharlyne Stabs, RN Phone Number: 12/31/2023, 10:21 AM  Clinical Narrative:   CM calling Navi to discuss pending Auth.  Discussed prior level of function with his wife and CMA updating Navi.     Expected Discharge Plan: Skilled Nursing Facility Barriers to Discharge: Insurance Authorization     Expected Discharge Plan and Services       Living arrangements for the past 2 months: Single Family Home                      Social Drivers of Health (SDOH) Interventions SDOH Screenings   Food Insecurity: No Food Insecurity (12/25/2023)  Housing: Low Risk  (12/25/2023)  Transportation Needs: No Transportation Needs (12/25/2023)  Utilities: Not At Risk (12/25/2023)  Financial Resource Strain: Low Risk  (08/28/2022)   Received from [facility]  Social Connections: Moderately Isolated (12/25/2023)  Stress: No Stress Concern Present (08/03/2022)   Received from [facility]  Tobacco Use: Medium Risk (12/25/2023)  Health Literacy: Low Risk  (08/16/2020)   Received from [facility]    Readmission Risk Interventions     No data to display

## 2023-12-31 NOTE — Discharge Summary (Signed)
 Physician Discharge Summary  Clayton Austin FMW:[id number] DOB: [birth year] DOA: 12/25/2023  PCP: Fanta, Tesfaye Demissie, MD Urologist: Dr. Sherrilee [facility]  Admit date: 12/25/2023 Discharge date: 12/31/2023  Disposition:  SNF   Recommendations for Outpatient Follow-up:  Follow up with PCP in 2 weeks Follow up with urologist in 2 weeks, [facility] [place] office Please obtain BMP/CBC in 1-2 weeks This patients foley cannot be removed due to requiring cystoscopy to place another foley. Any issues with the foley please contact urology.  Ok to resume apixaban 01/01/24   Home Health:  SNF   Discharge Condition: STABLE   CODE STATUS: FULL DIET:  Heart Healthy Foods recommended   Brief Hospitalization Summary: Please see all hospital notes, images, labs for full details of the hospitalization. Admission provider HPI:  83 y.o. male with medical history significant of hypertension, hypothyroidism, hyperlipidemia, coronary artery disease with history of CABG x 5 vessels in and NSTEMI in 2014, paroxysmal atrial fibrillation on anticoagulation.  Patient has been having difficulty urinating for the past 3 to 4 days.   Around the same time, he has noticed some hematuria.  Per his recollection, this has never happened before. He came to the hospital he came to the hospital due to suprapubic pain and difficulty urinating.  No fevers or chills although last night he got weak and fell, striking his head.  Due to inability to urinate, urology came and placed a Foley catheter.    Per EDP 7 ureteral stones were present which were pushed back into the bladder.  [name] was noted.  Urology, Dr. Sherrilee.   Hospital Course by listed problems addressed  Urosepsis sepsis -- RESOLVED  -During day 12/27/23 -patient was found hypothermic, mildly hypotensive-transferred to stepdown unit for close observation - Status post 2U PRBC transfusion, and bolus LR -currently hemodynamically  stable, mildly hypotensive with a BP of 102/46, temp 97.6, satting 100% - symptoms have resolved    -Completed antibiotics with negative cultures    -Status post Foley catheter placement by urologist    Hematuria with ureteral obstruction due to ureteral stone -Due to ureter stones -Foley catheter in place -Monitoring closely, continue IVF IV antibiotics -resume apixaban 01/01/24 -per Dr Sherrilee patients foley cannot be removed due to requiring cystoscopy to place another foley. Any issues with the foley please contact urology.   Acute on chronic anemia exacerbated by hematuria -POA hemoglobin 6.1>> 7.6, 6.9>>> 9.89  - Held anticoagulation - resume apixaban 01/01/24  - 12/27/2023, 2U PRBC blood transfusion       Latest Ref Rng & Units 12/28/2023    4:26 AM 12/27/2023    8:44 PM 12/27/2023    6:51 AM  CBC  WBC 4.0 - 10.5 K/uL 11.5       Hemoglobin 13.0 - 17.0 g/dL 9.9  89.7  6.9   Hematocrit 39.0 - 52.0 % 29.0  30.3  20.6   Platelets 150 - 400 K/uL 250           Acute renal insufficiency -- RESOLVED  - Ruling out CKD-improving BUN/creatinine - Exacerbated by nephrolithiasis, UTI - Foley catheter in place and will remain at discharge - per Dr. Sherrilee: patients foley cannot be removed due to requiring cystoscopy to place another foley. Any issues with the foley please contact urology.  Pt to follow up with Dr. Sherrilee to discuss other procedures - Monitor kidney function closely outpatient - Avoid nephrotoxins, hypotension Recent Labs       Lab  Results  Component Value Date    CREATININE 0.95 12/29/2023    CREATININE 1.26 (H) 12/28/2023    CREATININE 1.41 (H) 12/27/2023      CAD (coronary artery disease) of artery bypass graft - held anticoagulation, but can resume apixaban 01/01/24   Hypothyroidism - Continue Synthroid     Benign prostatic hyperplasia with weak urinary stream -Monitoring, Foley catheter currently in place -Continue finasteride , tamsulosin  -Follow-up with  urologist Dr. Sherrilee in 2 weeks   Benign essential HTN - stable resumed home meds per discharge med rec   Bladder stone -management as above Foley catheter in place   Paroxysmal atrial fibrillation -resume apixaban 01/01/24,  continue metoprolol    Discharge Diagnoses:  Principal Problem:   Sepsis (HCC) Active Problems:   CAD (coronary artery disease) of artery bypass graft   Hypothyroidism   Benign prostatic hyperplasia with lower urinary tract symptoms   Benign essential HTN   Bladder stone   Paroxysmal atrial fibrillation (HCC)   Atrial fibrillation with RVR (HCC)   Malnutrition of moderate degree   Discharge Instructions: Discharge Instructions     Ambulatory referral to Urology   Complete by: As directed    Hospital follow up with Dr Sherrilee      Allergies as of 12/31/2023   No Known Allergies      Medication List     PAUSE taking these medications    apixaban 5 MG Tabs tablet Wait to take this until: January 01, 2024 Commonly known as: ELIQUIS Take 5 mg by mouth 2 (two) times daily.       STOP taking these medications    amLODipine  5 MG tablet Commonly known as: NORVASC    cephALEXin  500 MG capsule Commonly known as: KEFLEX    furosemide  40 MG tablet Commonly known as: LASIX    ibuprofen 200 MG tablet Commonly known as: ADVIL   losartan  25 MG tablet Commonly known as: COZAAR    potassium chloride  SA 20 MEQ tablet Commonly known as: KLOR-CON  M       TAKE these medications    atorvastatin  20 MG tablet Commonly known as: LIPITOR  Take 20 mg by mouth at bedtime.   Constulose  10 GM/15ML solution Generic drug: lactulose  Take 20 g by mouth daily as needed for mild constipation.   feeding supplement Liqd Take 237 mLs by mouth 3 (three) times daily between meals.   finasteride  5 MG tablet Commonly known as: PROSCAR  Take 1 tablet (5 mg total) by mouth daily.   lactobacillus Pack Take 1 packet (1 g total) by mouth 3 (three) times daily  with meals for 14 days.   levothyroxine  100 MCG tablet Commonly known as: SYNTHROID  Take 100 mcg by mouth every morning.   lidocaine  5 % Commonly known as: LIDODERM  Place 1 patch onto the skin daily as needed (pain). Remove & Discard patch within 12 hours or as directed by MD   metoprolol  tartrate 25 MG tablet Commonly known as: LOPRESSOR  Take 0.5 tablets (12.5 mg total) by mouth 2 (two) times daily. What changed: how much to take   multivitamin with minerals Tabs tablet Take 1 tablet by mouth daily. Start taking on: January 01, 2024   omeprazole 20 MG capsule Commonly known as: PRILOSEC Take 20 mg by mouth every morning.   tamsulosin  0.4 MG Caps capsule Commonly known as: FLOMAX  Take 1 capsule (0.4 mg total) by mouth 2 (two) times daily. What changed: when to take this        Contact information for follow-up providers  [facility] Follow up.   Why: Out Patient Palliative Services        [facility] [place]. Schedule an appointment as soon as possible for a visit in 2 week(s).   Why: Hospital Follow Up Contact information: [address] [place]  [address] [phone]             Contact information for after-discharge care     Destination     [facility] .   Service: Skilled Nursing Contact information: [address] [place]  [address] [phone]                    No Known Allergies Allergies as of 12/31/2023   No Known Allergies      Medication List     PAUSE taking these medications    apixaban 5 MG Tabs tablet Wait to take this until: January 01, 2024 Commonly known as: ELIQUIS Take 5 mg by mouth 2 (two) times daily.       STOP taking these medications    amLODipine  5 MG tablet Commonly known as: NORVASC    cephALEXin  500 MG capsule Commonly known as: KEFLEX    furosemide  40 MG tablet Commonly known as: LASIX    ibuprofen 200 MG  tablet Commonly known as: ADVIL   losartan  25 MG tablet Commonly known as: COZAAR    potassium chloride  SA 20 MEQ tablet Commonly known as: KLOR-CON  M       TAKE these medications    atorvastatin  20 MG tablet Commonly known as: LIPITOR  Take 20 mg by mouth at bedtime.   Constulose  10 GM/15ML solution Generic drug: lactulose  Take 20 g by mouth daily as needed for mild constipation.   feeding supplement Liqd Take 237 mLs by mouth 3 (three) times daily between meals.   finasteride  5 MG tablet Commonly known as: PROSCAR  Take 1 tablet (5 mg total) by mouth daily.   lactobacillus Pack Take 1 packet (1 g total) by mouth 3 (three) times daily with meals for 14 days.   levothyroxine  100 MCG tablet Commonly known as: SYNTHROID  Take 100 mcg by mouth every morning.   lidocaine  5 % Commonly known as: LIDODERM  Place 1 patch onto the skin daily as needed (pain). Remove & Discard patch within 12 hours or as directed by MD   metoprolol  tartrate 25 MG tablet Commonly known as: LOPRESSOR  Take 0.5 tablets (12.5 mg total) by mouth 2 (two) times daily. What changed: how much to take   multivitamin with minerals Tabs tablet Take 1 tablet by mouth daily. Start taking on: January 01, 2024   omeprazole 20 MG capsule Commonly known as: PRILOSEC Take 20 mg by mouth every morning.   tamsulosin  0.4 MG Caps capsule Commonly known as: FLOMAX  Take 1 capsule (0.4 mg total) by mouth 2 (two) times daily. What changed: when to take this        Procedures/Studies: CT ABDOMEN PELVIS WO CONTRAST Result Date: 12/25/2023 CLINICAL DATA:  Abdominal pain, acute, nonlocalized EXAM: CT ABDOMEN AND PELVIS WITHOUT CONTRAST TECHNIQUE: Multidetector CT imaging of the abdomen and pelvis was performed following the standard protocol without IV contrast. RADIATION DOSE REDUCTION: This exam was performed according to the departmental dose-optimization program which includes automated exposure control,  adjustment of the mA and/or kV according to patient size and/or use of iterative reconstruction technique. COMPARISON:  October 09, 2023 FINDINGS: Of note, the lack of intravenous contrast limits evaluation of the solid organ parenchyma and vascularity. Lower chest: No  focal airspace consolidation or pleural effusion. Hepatobiliary: No mass.Punctate gallstones. No wall thickening. No intrahepatic or extrahepatic biliary ductal dilation. Pancreas: No mass or main ductal dilation. No peripancreatic inflammation or fluid collection. Spleen: Normal size. No mass. Adrenals/Urinary Tract: No adrenal masses. No renal mass. Moderate bilateral hydronephrosis. No obstructive ureteral calculi. The urinary bladder is distended, volume estimated at 1000 mL. Stomach/Bowel: The stomach is decompressed without focal abnormality. No small bowel wall thickening or inflammation. No small bowel obstruction.Normal appendix. Large right inguinal hernia containing multiple segments of small bowel and intra-abdominal fat. Overall, the hernia sac measures 13.4 cm in craniocaudal dimension. Vascular/Lymphatic: No aortic aneurysm. Diffuse aortoiliac atherosclerosis. No intraabdominal or pelvic lymphadenopathy. Reproductive: Moderate prostatomegaly.  No free pelvic fluid. Other: No pneumoperitoneum or ascites. Musculoskeletal: No acute fracture or destructive lesion. Multilevel degenerative disc disease of the spine. Mild superior endplate concavity at T12, likely degenerative. Severe bilateral hip osteoarthritis. Mild anasarca. IMPRESSION: 1. Moderate bilateral hydronephrosis. In the absence of obstructive ureteral calculi, this is likely due to vesicoureteral reflux from overdistention of the urinary bladder, volume estimated at 1000 mL. Correlation for causes of urinary retention recommended. 2. Large right inguinal hernia containing multiple segments of small bowel and intra-abdominal fat, measuring 13.4 cm in craniocaudal dimension. No bowel  obstruction or findings of vascular compromise. 3. Cholecystolithiasis.  No changes of acute cholecystitis. 4. Moderate prostatomegaly. Aortic Atherosclerosis (ICD10-I70.0). Electronically Signed   By: Rogelia Myers M.D.   On: 12/25/2023 15:40   CT Cervical Spine Wo Contrast Result Date: 12/25/2023 CLINICAL DATA:  Status post fall. EXAM: CT CERVICAL SPINE WITHOUT CONTRAST TECHNIQUE: Multidetector CT imaging of the cervical spine was performed without intravenous contrast. Multiplanar CT image reconstructions were also generated. RADIATION DOSE REDUCTION: This exam was performed according to the departmental dose-optimization program which includes automated exposure control, adjustment of the mA and/or kV according to patient size and/or use of iterative reconstruction technique. COMPARISON:  None Available. FINDINGS: Alignment: There is straightening of the normal cervical spinal lordosis. Skull base and vertebrae: No acute fracture. No primary bone lesion or focal pathologic process. Soft tissues and spinal canal: No prevertebral fluid or swelling. No visible canal hematoma. Disc levels: Marked severity endplate sclerosis, mild to moderate severity anterior osteophyte formation and posterior bony spurring are seen at the levels of C5-C6, C6-C7 and C7-T1. There is moderate to marked severity intervertebral disc space narrowing at the level of C6-C7, with marked severity intervertebral disc space narrowing at C7-T1. Bilateral marked severity multilevel facet joint hypertrophy is noted. Upper chest: Moderate severity biapical scarring and/or atelectasis is seen, right greater than left. Other: None. IMPRESSION: Marked severity multilevel degenerative changes, as described above, without evidence of an acute fracture or subluxation. Electronically Signed   By: Suzen Dials M.D.   On: 12/25/2023 15:24   CT Head Wo Contrast Result Date: 12/25/2023 CLINICAL DATA:  Status post fall. EXAM: CT HEAD WITHOUT  CONTRAST TECHNIQUE: Contiguous axial images were obtained from the base of the skull through the vertex without intravenous contrast. RADIATION DOSE REDUCTION: This exam was performed according to the departmental dose-optimization program which includes automated exposure control, adjustment of the mA and/or kV according to patient size and/or use of iterative reconstruction technique. COMPARISON:  None Available. FINDINGS: Brain: There is generalized cerebral atrophy with widening of the extra-axial spaces and ventricular dilatation. There are areas of decreased attenuation within the white matter tracts of the supratentorial brain, consistent with microvascular disease changes. Vascular: Marked severity calcification of the bilateral  cavernous carotid arteries is noted. Skull: Normal. Negative for fracture or focal lesion. Sinuses/Orbits: No acute finding. Other: Mild right frontal and mild left posterior parietal scalp soft tissue swelling is seen. IMPRESSION: 1. Generalized cerebral atrophy and microvascular disease changes of the supratentorial brain. 2. No acute intracranial abnormality. 3. Mild right frontal and mild left posterior parietal scalp soft tissue swelling. Electronically Signed   By: Suzen Dials M.D.   On: 12/25/2023 15:21   DG Chest Port 1 View Result Date: 12/25/2023 EXAM: 1 VIEW XRAY OF THE CHEST 12/25/2023 02:08:00 PM COMPARISON: None available. CLINICAL HISTORY: Fall, weakness. Pt c/o weakness, fall today, c/o dysuria. FINDINGS: LUNGS AND PLEURA: 1 cm left upper lobe pulmonary nodule. Biapical pleural parenchymal scarring. HEART AND MEDIASTINUM: Surgical changes in mediastinum. Intact sternotomy wires. Aortic calcification. BONES AND SOFT TISSUES: No acute osseous abnormality. IMPRESSION: 1. No acute findings. 2. 1 cm left upper lobe pulmonary nodule. Recommend CT chest for further evaluation. Electronically signed by: Donnice Mania MD 12/25/2023 02:26 PM EDT RP Workstation:  [id number]     Subjective:   Discharge Exam: Vitals:   12/30/23 1917 12/31/23 0442  BP: (!) 120/53 (!) 118/56  Pulse: 76 78  Resp: 18 16  Temp: 98.3 F (36.8 C) 98.3 F (36.8 C)  SpO2: 100% 97%   Vitals:   12/30/23 0406 12/30/23 1415 12/30/23 1917 12/31/23 0442  BP: (!) 111/58 (!) 141/65 (!) 120/53 (!) 118/56  Pulse: 82 83 76 78  Resp: 18  18 16   Temp: 98.4 F (36.9 C) 98 F (36.7 C) 98.3 F (36.8 C) 98.3 F (36.8 C)  TempSrc: Oral Oral Oral Oral  SpO2: 97% 99% 100% 97%  Weight:      Height:        General: Pt is alert, awake, not in acute distress Cardiovascular: RRR, S1/S2 +, no rubs, no gallops Respiratory: CTA bilaterally, no wheezing, no rhonchi Abdominal: Soft, NT, ND, bowel sounds + Extremities: no edema, no cyanosis   The results of significant diagnostics from this hospitalization (including imaging, microbiology, ancillary and laboratory) are listed below for reference.     Microbiology: Recent Results (from the past 240 hours)  Urine Culture     Status: None   Collection Time: 12/25/23  1:49 PM   Specimen: Urine, Clean Catch  Result Value Ref Range Status   Specimen Description   Final    URINE, CLEAN CATCH Performed at [facility], [address], [place], [place] [address]    Special Requests   Final    NONE Performed at [facility], [address] [place], [place] [address]    Culture   Final    NO GROWTH Performed at [facility], [address] [place], [place] [address]    Report Status 12/26/2023 FINAL  Final  Culture, blood (Routine X 2) w Reflex to ID Panel     Status: None (Preliminary result)   Collection Time: 12/27/23  2:35 PM   Specimen: BLOOD  Result Value Ref Range Status   Specimen Description BLOOD BLOOD LEFT ARM  Final   Special Requests   Final    BOTTLES DRAWN AEROBIC AND ANAEROBIC Blood Culture adequate volume   Culture   Final    NO GROWTH 4 DAYS Performed at [facility], [address], [place],  [place] [address]    Report Status PENDING  Incomplete  MRSA Next Gen by PCR, Nasal     Status: None   Collection Time: 12/27/23  3:02 PM   Specimen: Nasal Mucosa; Nasal Swab  Result Value Ref Range Status   MRSA by PCR Next Gen NOT DETECTED NOT DETECTED Final    Comment: (NOTE) The GeneXpert MRSA Assay (FDA approved for NASAL specimens only), is one component of a comprehensive MRSA colonization surveillance program. It is not intended to diagnose MRSA infection nor to guide or monitor treatment for MRSA infections. Test performance is not FDA approved in patients less than 62 years old. Performed at [facility], [address] [place], [place] [address]   Culture, blood (Routine X 2) w Reflex to ID Panel     Status: None (Preliminary result)   Collection Time: 12/27/23  4:38 PM   Specimen: BLOOD  Result Value Ref Range Status   Specimen Description BLOOD LEFT ANTECUBITAL  Final   Special Requests   Final    BOTTLES DRAWN AEROBIC AND ANAEROBIC Blood Culture adequate volume   Culture   Final    NO GROWTH 4 DAYS Performed at [facility], [address] [place], [place] [address]    Report Status PENDING  Incomplete     Labs: BNP (last 3 results) No results for input(s): BNP in the last 8760 hours. Basic Metabolic Panel: Recent Labs  Lab 12/25/23 1553 12/25/23 1941 12/26/23 0456 12/27/23 0423 12/28/23 0426 12/29/23 0528  NA 132* 131* 134* 135 134* 133*  K 4.8 4.3 4.6 4.1 4.0 4.0  CL 98 100 104 106 108 103  CO2 17* 17* 23 24 24 25   GLUCOSE 113* 100* 102* 108* 86 88  BUN 48* 43* 38* 26* 25* 22  CREATININE 2.82* 2.40* 2.01* 1.41* 1.26* 0.95  CALCIUM  9.0 8.5* 8.5* 8.1* 8.0* 7.8*  MG 2.1  --   --   --   --   --    Liver Function Tests: Recent Labs  Lab 12/25/23 1553 12/27/23 0423 12/28/23 0426 12/29/23 0528  AST 36 23 19 20   ALT 16 14 14 12   ALKPHOS 61 51 56 52  BILITOT 0.9 0.8 0.8 0.5  PROT 6.7 5.1* 5.3* 5.0*  ALBUMIN  3.4* 2.4* 2.4* 2.2*   No results for  input(s): LIPASE, AMYLASE in the last 168 hours. No results for input(s): AMMONIA in the last 168 hours. CBC: Recent Labs  Lab 12/25/23 1553 12/26/23 0036 12/27/23 0423 12/27/23 9348 12/27/23 2044 12/28/23 0426 12/29/23 0528 12/30/23 0457 12/31/23 0457  WBC 12.0*   < > 12.5*  --   --  11.5* 11.4* 11.8* 12.0*  NEUTROABS 9.6*  --   --   --   --   --   --   --   --   HGB 6.1*   < > 6.9*   < > 10.2* 9.9* 9.3* 9.6* 9.6*  HCT 19.9*   < > 20.4*   < > 30.3* 29.0* 28.0* 29.6* 29.5*  MCV 99.5   < > 94.9  --   --  90.9 92.4 94.6 95.5  PLT 268   < > 239  --   --  250 263 286 283   < > = values in this interval not displayed.   Cardiac Enzymes: No results for input(s): CKTOTAL, CKMB, CKMBINDEX, TROPONINI in the last 168 hours. BNP: Invalid input(s): POCBNP CBG: Recent Labs  Lab 12/27/23 1438  GLUCAP 111*   D-Dimer No results for input(s): DDIMER in the last 72 hours. Hgb A1c No results for input(s): HGBA1C in the last 72 hours. Lipid Profile No results for input(s): CHOL,  HDL, LDLCALC, TRIG, CHOLHDL, LDLDIRECT in the last 72 hours. Thyroid  function studies No results for input(s): TSH, T4TOTAL, T3FREE, THYROIDAB in the last 72 hours.  Invalid input(s): FREET3 Anemia work up No results for input(s): VITAMINB12, FOLATE, FERRITIN, TIBC, IRON, RETICCTPCT in the last 72 hours. Urinalysis    Component Value Date/Time   COLORURINE RED (A) 12/25/2023 1349   APPEARANCEUR CLOUDY (A) 12/25/2023 1349   APPEARANCEUR Cloudy (A) 06/26/2023 1216   LABSPEC 1.016 12/25/2023 1349   PHURINE 6.0 12/25/2023 1349   GLUCOSEU NEGATIVE 12/25/2023 1349   HGBUR MODERATE (A) 12/25/2023 1349   BILIRUBINUR NEGATIVE 12/25/2023 1349   BILIRUBINUR Negative 06/26/2023 1216   KETONESUR NEGATIVE 12/25/2023 1349   PROTEINUR 100 (A) 12/25/2023 1349   UROBILINOGEN 1.0 08/17/2012 0408   NITRITE NEGATIVE 12/25/2023 1349   LEUKOCYTESUR NEGATIVE 12/25/2023  1349   Sepsis Labs Recent Labs  Lab 12/28/23 0426 12/29/23 0528 12/30/23 0457 12/31/23 0457  WBC 11.5* 11.4* 11.8* 12.0*   Microbiology Recent Results (from the past 240 hours)  Urine Culture     Status: None   Collection Time: 12/25/23  1:49 PM   Specimen: Urine, Clean Catch  Result Value Ref Range Status   Specimen Description   Final    URINE, CLEAN CATCH Performed at [facility], [address], [place], [place] [address]    Special Requests   Final    NONE Performed at [facility], [address] [place], [place] [address]    Culture   Final    NO GROWTH Performed at [facility], [address] [place], [place] [address]    Report Status 12/26/2023 FINAL  Final  Culture, blood (Routine X 2) w Reflex to ID Panel     Status: None (Preliminary result)   Collection Time: 12/27/23  2:35 PM   Specimen: BLOOD  Result Value Ref Range Status   Specimen Description BLOOD BLOOD LEFT ARM  Final   Special Requests   Final    BOTTLES DRAWN AEROBIC AND ANAEROBIC Blood Culture adequate volume   Culture   Final    NO GROWTH 4 DAYS Performed at [facility], [address], [place], [place] [address]    Report Status PENDING  Incomplete  MRSA Next Gen by PCR, Nasal     Status: None   Collection Time: 12/27/23  3:02 PM   Specimen: Nasal Mucosa; Nasal Swab  Result Value Ref Range Status   MRSA by PCR Next Gen NOT DETECTED NOT DETECTED Final    Comment: (NOTE) The GeneXpert MRSA Assay (FDA approved for NASAL specimens only), is one component of a comprehensive MRSA colonization surveillance program. It is not intended to diagnose MRSA infection nor to guide or monitor treatment for MRSA infections. Test performance is not FDA approved in patients less than 61 years old. Performed at [facility], [address] [place], [place] [address]   Culture, blood (Routine X 2) w Reflex to ID Panel     Status: None (Preliminary result)   Collection Time: 12/27/23  4:38 PM    Specimen: BLOOD  Result Value Ref Range Status   Specimen Description BLOOD LEFT ANTECUBITAL  Final   Special Requests   Final    BOTTLES DRAWN AEROBIC AND ANAEROBIC Blood Culture adequate volume   Culture   Final    NO GROWTH 4 DAYS Performed at [facility], [address] [place], [place] [address]    Report Status PENDING  Incomplete   Time coordinating  discharge:  44 mins  SIGNED:  Afton Louder, MD  [facility] 12/31/2023, 11:41 AM How to contact the [facility] Attending or Consulting provider 7A - 7P or covering provider during after hours 7P -7A, for this patient?  Check the care team in [facility] and look for a) attending/consulting [facility] provider listed and b) the [facility] team listed Log into [email or web address] and use [place]'s universal password to access. If you do not have the password, please contact the hospital operator. Locate the [facility] provider you are looking for under [facility] and page to a number that you can be directly reached. If you still have difficulty reaching the provider, please page the [facility] (Director on Call) for the Hospitalists listed on amion for assistance.

## 2023-12-31 NOTE — TOC Transition Note (Signed)
 Transition of Care [facility]) - Discharge Note   Patient Details  Name: Clayton Austin MRN: [id number] Date of Birth: [birth year]  Transition of [facility]) CM/SW Contact:  Sharlyne Stabs, RN Phone Number: 12/31/2023, 11:22 AM   Clinical Narrative:  Insurance approved 9/10 - 9/12, next review 9/12, mayme barrows id [id number], auth id [id number] for [facility]. CM update his wife. Shanna provided room number, Rn will call report. TOC following to call EMS.  MD completing DC summary.     Final next level of care: Skilled Nursing Facility Barriers to Discharge: Barriers Resolved   Patient Goals and CMS Choice Patient states their goals for this hospitalization and ongoing recovery are:: agreeable to SNF CMS Medicare.gov Compare Post Acute Care list provided to:: Patient Choice offered to / list presented to : Patient [place] ownership interest in [facility].provided to:: Patient    Discharge Placement               Patient to be transferred to facility by: EMS Name of family member notified: Lola Patient and family notified of of transfer: 12/31/23  Discharge Plan and Services Additional resources added to the After Visit Summary for         Social Drivers of Health (SDOH) Interventions SDOH Screenings   Food Insecurity: No Food Insecurity (12/25/2023)  Housing: Low Risk  (12/25/2023)  Transportation Needs: No Transportation Needs (12/25/2023)  Utilities: Not At Risk (12/25/2023)  Financial Resource Strain: Low Risk  (08/28/2022)   Received from [facility]  Social Connections: Moderately Isolated (12/25/2023)  Stress: No Stress Concern Present (08/03/2022)   Received from [facility]  Tobacco Use: Medium Risk (12/25/2023)  Health Literacy: Low Risk  (08/16/2020)   Received from [facility]

## 2023-12-31 NOTE — Progress Notes (Signed)
 Mobility Specialist Progress Note:    12/31/23 1220  Mobility  Activity Dangled on edge of bed;Moved bed into chair position  Level of Assistance Minimal assist, patient does 75% or more  Assistive Device None  Distance Ambulated (ft) 0 ft  Range of Motion/Exercises Active;All extremities  Activity Response Tolerated well  Mobility Referral Yes  Mobility visit 1 Mobility  Mobility Specialist Start Time (ACUTE ONLY) 1220  Mobility Specialist Stop Time (ACUTE ONLY) 1236  Mobility Specialist Time Calculation (min) (ACUTE ONLY) 16 min   Pt received in bed, agreeable to sit up for lunch. Required MinA to dangle on EOB. Alarm on, call bell in reach. All needs met.  Leiann Sporer Mobility Specialist Please contact via Special educational needs teacher or  Rehab office at [phone]

## 2023-12-31 NOTE — Plan of Care (Signed)

## 2024-01-01 LAB — CULTURE, BLOOD (ROUTINE X 2)
Culture: NO GROWTH
Culture: NO GROWTH
Special Requests: ADEQUATE
Special Requests: ADEQUATE

## 2024-01-05 DIAGNOSIS — N139 Obstructive and reflux uropathy, unspecified: Secondary | ICD-10-CM | POA: Diagnosis not present

## 2024-01-05 DIAGNOSIS — R5381 Other malaise: Secondary | ICD-10-CM | POA: Diagnosis not present

## 2024-01-05 DIAGNOSIS — N361 Urethral diverticulum: Secondary | ICD-10-CM | POA: Diagnosis not present

## 2024-01-06 ENCOUNTER — Ambulatory Visit: Admitting: Urology

## 2024-01-06 DIAGNOSIS — N138 Other obstructive and reflux uropathy: Secondary | ICD-10-CM

## 2024-01-08 DIAGNOSIS — N361 Urethral diverticulum: Secondary | ICD-10-CM | POA: Diagnosis not present

## 2024-01-08 DIAGNOSIS — E039 Hypothyroidism, unspecified: Secondary | ICD-10-CM | POA: Diagnosis not present

## 2024-01-08 DIAGNOSIS — R5381 Other malaise: Secondary | ICD-10-CM | POA: Diagnosis not present

## 2024-01-08 DIAGNOSIS — K219 Gastro-esophageal reflux disease without esophagitis: Secondary | ICD-10-CM | POA: Diagnosis not present

## 2024-01-08 DIAGNOSIS — I4891 Unspecified atrial fibrillation: Secondary | ICD-10-CM | POA: Diagnosis not present

## 2024-01-08 DIAGNOSIS — I10 Essential (primary) hypertension: Secondary | ICD-10-CM | POA: Diagnosis not present

## 2024-01-08 DIAGNOSIS — D509 Iron deficiency anemia, unspecified: Secondary | ICD-10-CM | POA: Diagnosis not present

## 2024-01-08 DIAGNOSIS — E441 Mild protein-calorie malnutrition: Secondary | ICD-10-CM | POA: Diagnosis not present

## 2024-01-08 DIAGNOSIS — E785 Hyperlipidemia, unspecified: Secondary | ICD-10-CM | POA: Diagnosis not present

## 2024-01-08 DIAGNOSIS — N139 Obstructive and reflux uropathy, unspecified: Secondary | ICD-10-CM | POA: Diagnosis not present

## 2024-01-11 DIAGNOSIS — N361 Urethral diverticulum: Secondary | ICD-10-CM | POA: Diagnosis not present

## 2024-01-11 DIAGNOSIS — D509 Iron deficiency anemia, unspecified: Secondary | ICD-10-CM | POA: Diagnosis not present

## 2024-01-11 DIAGNOSIS — R5381 Other malaise: Secondary | ICD-10-CM | POA: Diagnosis not present

## 2024-01-11 DIAGNOSIS — K219 Gastro-esophageal reflux disease without esophagitis: Secondary | ICD-10-CM | POA: Diagnosis not present

## 2024-01-11 DIAGNOSIS — N139 Obstructive and reflux uropathy, unspecified: Secondary | ICD-10-CM | POA: Diagnosis not present

## 2024-01-11 DIAGNOSIS — E039 Hypothyroidism, unspecified: Secondary | ICD-10-CM | POA: Diagnosis not present

## 2024-01-11 DIAGNOSIS — E441 Mild protein-calorie malnutrition: Secondary | ICD-10-CM | POA: Diagnosis not present

## 2024-01-11 DIAGNOSIS — I4891 Unspecified atrial fibrillation: Secondary | ICD-10-CM | POA: Diagnosis not present

## 2024-01-11 DIAGNOSIS — I10 Essential (primary) hypertension: Secondary | ICD-10-CM | POA: Diagnosis not present

## 2024-01-11 DIAGNOSIS — E785 Hyperlipidemia, unspecified: Secondary | ICD-10-CM | POA: Diagnosis not present

## 2024-01-15 DIAGNOSIS — M79642 Pain in left hand: Secondary | ICD-10-CM | POA: Diagnosis not present

## 2024-01-15 DIAGNOSIS — M79641 Pain in right hand: Secondary | ICD-10-CM | POA: Diagnosis not present

## 2024-01-15 DIAGNOSIS — N139 Obstructive and reflux uropathy, unspecified: Secondary | ICD-10-CM | POA: Diagnosis not present

## 2024-01-15 DIAGNOSIS — D509 Iron deficiency anemia, unspecified: Secondary | ICD-10-CM | POA: Diagnosis not present

## 2024-01-15 DIAGNOSIS — R5381 Other malaise: Secondary | ICD-10-CM | POA: Diagnosis not present

## 2024-01-15 DIAGNOSIS — K219 Gastro-esophageal reflux disease without esophagitis: Secondary | ICD-10-CM | POA: Diagnosis not present

## 2024-01-15 DIAGNOSIS — I10 Essential (primary) hypertension: Secondary | ICD-10-CM | POA: Diagnosis not present

## 2024-01-15 DIAGNOSIS — E785 Hyperlipidemia, unspecified: Secondary | ICD-10-CM | POA: Diagnosis not present

## 2024-01-15 DIAGNOSIS — E039 Hypothyroidism, unspecified: Secondary | ICD-10-CM | POA: Diagnosis not present

## 2024-01-15 DIAGNOSIS — M6281 Muscle weakness (generalized): Secondary | ICD-10-CM | POA: Diagnosis not present

## 2024-01-15 DIAGNOSIS — Z515 Encounter for palliative care: Secondary | ICD-10-CM | POA: Diagnosis not present

## 2024-01-15 DIAGNOSIS — Z87440 Personal history of urinary (tract) infections: Secondary | ICD-10-CM | POA: Diagnosis not present

## 2024-01-15 DIAGNOSIS — I2510 Atherosclerotic heart disease of native coronary artery without angina pectoris: Secondary | ICD-10-CM | POA: Diagnosis not present

## 2024-01-15 DIAGNOSIS — R5383 Other fatigue: Secondary | ICD-10-CM | POA: Diagnosis not present

## 2024-01-15 DIAGNOSIS — E441 Mild protein-calorie malnutrition: Secondary | ICD-10-CM | POA: Diagnosis not present

## 2024-01-15 DIAGNOSIS — I4891 Unspecified atrial fibrillation: Secondary | ICD-10-CM | POA: Diagnosis not present

## 2024-01-18 DIAGNOSIS — E785 Hyperlipidemia, unspecified: Secondary | ICD-10-CM | POA: Diagnosis not present

## 2024-01-18 DIAGNOSIS — D509 Iron deficiency anemia, unspecified: Secondary | ICD-10-CM | POA: Diagnosis not present

## 2024-01-18 DIAGNOSIS — R5381 Other malaise: Secondary | ICD-10-CM | POA: Diagnosis not present

## 2024-01-18 DIAGNOSIS — E039 Hypothyroidism, unspecified: Secondary | ICD-10-CM | POA: Diagnosis not present

## 2024-01-18 DIAGNOSIS — E441 Mild protein-calorie malnutrition: Secondary | ICD-10-CM | POA: Diagnosis not present

## 2024-01-18 DIAGNOSIS — N361 Urethral diverticulum: Secondary | ICD-10-CM | POA: Diagnosis not present

## 2024-01-18 DIAGNOSIS — I4891 Unspecified atrial fibrillation: Secondary | ICD-10-CM | POA: Diagnosis not present

## 2024-01-18 DIAGNOSIS — K219 Gastro-esophageal reflux disease without esophagitis: Secondary | ICD-10-CM | POA: Diagnosis not present

## 2024-01-18 DIAGNOSIS — I10 Essential (primary) hypertension: Secondary | ICD-10-CM | POA: Diagnosis not present

## 2024-01-25 ENCOUNTER — Other Ambulatory Visit (HOSPITAL_COMMUNITY)
Admission: RE | Admit: 2024-01-25 | Discharge: 2024-01-25 | Disposition: A | Discharge status: Home / Self Care | Source: Ambulatory Visit | Attending: Internal Medicine | Admitting: Internal Medicine

## 2024-01-25 DIAGNOSIS — N139 Obstructive and reflux uropathy, unspecified: Secondary | ICD-10-CM | POA: Diagnosis not present

## 2024-01-25 DIAGNOSIS — I480 Paroxysmal atrial fibrillation: Secondary | ICD-10-CM | POA: Diagnosis not present

## 2024-01-25 DIAGNOSIS — E785 Hyperlipidemia, unspecified: Secondary | ICD-10-CM | POA: Diagnosis not present

## 2024-01-25 DIAGNOSIS — R600 Localized edema: Secondary | ICD-10-CM | POA: Diagnosis not present

## 2024-01-25 DIAGNOSIS — R2689 Other abnormalities of gait and mobility: Secondary | ICD-10-CM | POA: Diagnosis not present

## 2024-01-25 DIAGNOSIS — M1990 Unspecified osteoarthritis, unspecified site: Secondary | ICD-10-CM | POA: Diagnosis not present

## 2024-01-25 DIAGNOSIS — N200 Calculus of kidney: Secondary | ICD-10-CM | POA: Diagnosis not present

## 2024-01-25 DIAGNOSIS — I517 Cardiomegaly: Secondary | ICD-10-CM | POA: Diagnosis not present

## 2024-01-25 LAB — CBC WITH DIFFERENTIAL/PLATELET
Abs Immature Granulocytes: 0.02 K/uL (ref 0.00–0.07)
Basophils Absolute: 0 K/uL (ref 0.0–0.1)
Basophils Relative: 0 %
Eosinophils Absolute: 0.1 K/uL (ref 0.0–0.5)
Eosinophils Relative: 1 %
HCT: 36.6 % — ABNORMAL LOW (ref 39.0–52.0)
Hemoglobin: 11.8 g/dL — ABNORMAL LOW (ref 13.0–17.0)
Immature Granulocytes: 0 %
Lymphocytes Relative: 20 %
Lymphs Abs: 1.8 K/uL (ref 0.7–4.0)
MCH: 30.3 pg (ref 26.0–34.0)
MCHC: 32.2 g/dL (ref 30.0–36.0)
MCV: 94.1 fL (ref 80.0–100.0)
Monocytes Absolute: 0.9 K/uL (ref 0.1–1.0)
Monocytes Relative: 10 %
Neutro Abs: 6.3 K/uL (ref 1.7–7.7)
Neutrophils Relative %: 69 %
Platelets: 282 K/uL (ref 150–400)
RBC: 3.89 MIL/uL — ABNORMAL LOW (ref 4.22–5.81)
RDW: 13.6 % (ref 11.5–15.5)
WBC: 9.2 K/uL (ref 4.0–10.5)
nRBC: 0 % (ref 0.0–0.2)

## 2024-01-25 LAB — BASIC METABOLIC PANEL WITH GFR
Anion gap: 7 (ref 5–15)
BUN: 17 mg/dL (ref 8–23)
CO2: 27 mmol/L (ref 22–32)
Calcium: 9.1 mg/dL (ref 8.9–10.3)
Chloride: 100 mmol/L (ref 98–111)
Creatinine, Ser: 1.2 mg/dL (ref 0.61–1.24)
GFR, Estimated: >60 mL/min (ref >60)
Glucose, Bld: 87 mg/dL (ref 70–99)
Potassium: 4 mmol/L (ref 3.5–5.1)
Sodium: 133 mmol/L — ABNORMAL LOW (ref 135–145)

## 2024-01-27 DIAGNOSIS — R2689 Other abnormalities of gait and mobility: Secondary | ICD-10-CM | POA: Diagnosis not present

## 2024-02-04 ENCOUNTER — Telehealth: Payer: Self-pay

## 2024-02-04 ENCOUNTER — Telehealth: Payer: Self-pay | Admitting: Urology

## 2024-02-04 NOTE — Telephone Encounter (Signed)
   Pre-operative Risk Assessment    Patient Name: Clayton Austin  DOB: March 22, 1941 MRN: [id number]   Date of last office visit: 07/10/23 Date of next office visit:    Request for Surgical Clearance    Procedure:  cystolitholapaxy   Date of Surgery:  Clearance 03/07/24                                 Surgeon:  Dr. Sherrilee  Surgeon's Group or Practice Name:  [facility]  Phone number:  [phone] Fax number:  [phone]   Type of Clearance Requested:   - Medical  - Pharmacy:  Hold Apixaban (Eliquis) 2 days prior and 3 days post    Type of Anesthesia:  General    Additional requests/questions:    Bonney Rebeca Blight   02/04/2024, 3:34 PM

## 2024-02-04 NOTE — Telephone Encounter (Signed)
 She was supposed to call me once patient was home from rehab facility.  I will cal her, thank you.

## 2024-02-04 NOTE — Telephone Encounter (Signed)
 Patient has stones and has cath. She has not heard from anyone to schedule surgery or cath change. [phone]

## 2024-02-05 NOTE — Telephone Encounter (Signed)
 Patient with diagnosis of atrial fibrillation on Eliquis for anticoagulation.    Procedure:  cystolitholapaxy    Date of Surgery:  Clearance 03/07/24      CHA2DS2-VASc Score = 4   This indicates a 4.8% annual risk of stroke. The patient's score is based upon: CHF History: 0 HTN History: 1 Diabetes History: 0 Stroke History: 0 Vascular Disease History: 1 Age Score: 2 Gender Score: 0   CrCl 43 Platelet count 282  Patient has not had an Afib/aflutter ablation in the last 3 months, DCCV within the last 4 weeks or a watchman implanted in the last 45 days   Per office protocol, patient can hold Eliquis for 2 days prior to procedure.  Please resume Eliquis as soon as safely possible based on surgeon recommendation.  Patient will not need bridging with Lovenox  (enoxaparin ) around procedure.  **This guidance is not considered finalized until pre-operative APP has relayed final recommendations.**

## 2024-02-05 NOTE — Telephone Encounter (Signed)
   Name: Clayton Austin  DOB: 12-Apr-1941  MRN: [id number]  Primary Cardiologist: Vishnu P Mallipeddi, MD   Preoperative team, please contact this patient and set up a phone call appointment for further preoperative risk assessment. Please obtain consent and complete medication review. Thank you for your help.  I confirm that guidance regarding antiplatelet and oral anticoagulation therapy has been completed and, if necessary, noted below.  Per office protocol, patient can hold Eliquis for 2 days prior to procedure.  Please resume Eliquis as soon as safely possible based on surgeon recommendation.   I also confirmed the patient resides in the state of  . As per [facility] Medical Board telemedicine laws, the patient must reside in the state in which the provider is licensed.   Lamarr Satterfield, NP 02/05/2024, 3:26 PM [facility]

## 2024-02-08 ENCOUNTER — Ambulatory Visit

## 2024-02-10 ENCOUNTER — Ambulatory Visit

## 2024-02-10 DIAGNOSIS — N210 Calculus in bladder: Secondary | ICD-10-CM

## 2024-02-10 DIAGNOSIS — R339 Retention of urine, unspecified: Secondary | ICD-10-CM | POA: Diagnosis not present

## 2024-02-10 MED ORDER — CIPROFLOXACIN HCL 500 MG PO TABS
500.0000 mg | ORAL_TABLET | Freq: Once | ORAL | Status: AC
  Administered 2024-02-10: 500 mg via ORAL

## 2024-02-10 NOTE — Progress Notes (Signed)
 Simple Catheter Placement  Due to urinary retention patient is present today for a foley cath placement.  Patient was cleaned and prepped in a sterile fashion with Betadinex3 . A 16  FR foley catheter was inserted, Flush return was noted  50 ml, urine was Pink in color.  The balloon was filled with 10cc of sterile water .  A night bag was attached for drainage. Patient was also given a night bag to take home and was given instruction on how to change from one bag to another.  Patient was given instruction on proper catheter care.  Patient tolerated well, no complications were noted   Performed by: Carlos, CMA  Additional notes/ Follow up:  Surgery 03/07/2024

## 2024-03-02 ENCOUNTER — Other Ambulatory Visit: Payer: Self-pay

## 2024-03-02 ENCOUNTER — Encounter (HOSPITAL_COMMUNITY): Payer: Self-pay

## 2024-03-02 ENCOUNTER — Encounter (HOSPITAL_COMMUNITY)
Admission: RE | Admit: 2024-03-02 | Discharge: 2024-03-02 | Disposition: A | Discharge status: Home / Self Care | Source: Ambulatory Visit | Attending: Urology | Admitting: Urology

## 2024-03-07 ENCOUNTER — Encounter (HOSPITAL_COMMUNITY): Payer: Self-pay | Admitting: Urology

## 2024-03-07 ENCOUNTER — Ambulatory Visit (HOSPITAL_COMMUNITY): Admitting: Anesthesiology

## 2024-03-07 ENCOUNTER — Ambulatory Visit (HOSPITAL_COMMUNITY)
Admission: RE | Admit: 2024-03-07 | Discharge: 2024-03-07 | Disposition: A | Discharge status: Home / Self Care | Attending: Urology | Admitting: Urology

## 2024-03-07 ENCOUNTER — Encounter (HOSPITAL_COMMUNITY)
Admission: RE | Disposition: A | Discharge status: Home / Self Care | Payer: Self-pay | Source: Home / Self Care | Attending: Urology

## 2024-03-07 DIAGNOSIS — N289 Disorder of kidney and ureter, unspecified: Secondary | ICD-10-CM | POA: Insufficient documentation

## 2024-03-07 DIAGNOSIS — I2510 Atherosclerotic heart disease of native coronary artery without angina pectoris: Secondary | ICD-10-CM | POA: Diagnosis not present

## 2024-03-07 DIAGNOSIS — I4891 Unspecified atrial fibrillation: Secondary | ICD-10-CM | POA: Diagnosis not present

## 2024-03-07 DIAGNOSIS — I4510 Unspecified right bundle-branch block: Secondary | ICD-10-CM | POA: Insufficient documentation

## 2024-03-07 DIAGNOSIS — I10 Essential (primary) hypertension: Secondary | ICD-10-CM | POA: Insufficient documentation

## 2024-03-07 DIAGNOSIS — I252 Old myocardial infarction: Secondary | ICD-10-CM | POA: Diagnosis not present

## 2024-03-07 DIAGNOSIS — N210 Calculus in bladder: Secondary | ICD-10-CM | POA: Diagnosis present

## 2024-03-07 DIAGNOSIS — K219 Gastro-esophageal reflux disease without esophagitis: Secondary | ICD-10-CM | POA: Insufficient documentation

## 2024-03-07 DIAGNOSIS — N319 Neuromuscular dysfunction of bladder, unspecified: Secondary | ICD-10-CM | POA: Diagnosis present

## 2024-03-07 DIAGNOSIS — I6529 Occlusion and stenosis of unspecified carotid artery: Secondary | ICD-10-CM | POA: Diagnosis not present

## 2024-03-07 DIAGNOSIS — E039 Hypothyroidism, unspecified: Secondary | ICD-10-CM | POA: Insufficient documentation

## 2024-03-07 DIAGNOSIS — Z951 Presence of aortocoronary bypass graft: Secondary | ICD-10-CM | POA: Diagnosis not present

## 2024-03-07 DIAGNOSIS — Z87891 Personal history of nicotine dependence: Secondary | ICD-10-CM | POA: Insufficient documentation

## 2024-03-07 HISTORY — PX: CYSTOSCOPY WITH LITHOLAPAXY: SHX1425

## 2024-03-07 SURGERY — CYSTOSCOPY, WITH BLADDER CALCULUS LITHOLAPAXY
Anesthesia: General | Site: Bladder

## 2024-03-07 MED ORDER — CHLORHEXIDINE GLUCONATE 0.12 % MT SOLN
OROMUCOSAL | Status: AC
  Administered 2024-03-07: 15 mL via OROMUCOSAL
  Filled 2024-03-07: qty 15

## 2024-03-07 MED ORDER — OXYCODONE HCL 5 MG/5ML PO SOLN
5.0000 mg | Freq: Once | ORAL | Status: AC | PRN
  Administered 2024-03-07: 5 mg via ORAL

## 2024-03-07 MED ORDER — CEFAZOLIN SODIUM-DEXTROSE 2-4 GM/100ML-% IV SOLN
INTRAVENOUS | Status: AC
  Filled 2024-03-07: qty 100

## 2024-03-07 MED ORDER — FENTANYL CITRATE (PF) 50 MCG/ML IJ SOSY: 25.0000 ug | PREFILLED_SYRINGE | INTRAMUSCULAR | Status: DC | PRN

## 2024-03-07 MED ORDER — TRAMADOL HCL 50 MG PO TABS: 50.0000 mg | ORAL_TABLET | Freq: Four times a day (QID) | ORAL | 0 refills | Status: AC | PRN

## 2024-03-07 MED ORDER — LIDOCAINE 2% (20 MG/ML) 5 ML SYRINGE
INTRAMUSCULAR | Status: DC | PRN
Start: 2024-03-07 — End: 2024-03-07
  Administered 2024-03-07: 40 mg via INTRAVENOUS

## 2024-03-07 MED ORDER — ONDANSETRON HCL 4 MG/2ML IJ SOLN
INTRAMUSCULAR | Status: AC
  Filled 2024-03-07: qty 2

## 2024-03-07 MED ORDER — ORAL CARE MOUTH RINSE: 15.0000 mL | Freq: Once | OROMUCOSAL | Status: AC

## 2024-03-07 MED ORDER — WATER FOR IRRIGATION, STERILE IR SOLN
Status: DC | PRN
Start: 2024-03-07 — End: 2024-03-07
  Administered 2024-03-07: 1000 mL

## 2024-03-07 MED ORDER — OXYCODONE HCL 5 MG PO TABS: 5.0000 mg | ORAL_TABLET | Freq: Once | ORAL | Status: AC | PRN

## 2024-03-07 MED ORDER — LIDOCAINE 2% (20 MG/ML) 5 ML SYRINGE
INTRAMUSCULAR | Status: AC
Start: 2024-03-07 — End: 2024-03-07
  Filled 2024-03-07: qty 5

## 2024-03-07 MED ORDER — SODIUM CHLORIDE 0.9 % IR SOLN
Status: DC | PRN
  Administered 2024-03-07: 3000 mL

## 2024-03-07 MED ORDER — FENTANYL CITRATE (PF) 100 MCG/2ML IJ SOLN
INTRAMUSCULAR | Status: DC | PRN
  Administered 2024-03-07 (×4): 25 ug via INTRAVENOUS

## 2024-03-07 MED ORDER — PROPOFOL 500 MG/50ML IV EMUL
INTRAVENOUS | Status: DC | PRN
Start: 2024-03-07 — End: 2024-03-07
  Administered 2024-03-07: 40 mg via INTRAVENOUS
  Administered 2024-03-07: 110 mg via INTRAVENOUS

## 2024-03-07 MED ORDER — FENTANYL CITRATE (PF) 100 MCG/2ML IJ SOLN
INTRAMUSCULAR | Status: AC
  Filled 2024-03-07: qty 2

## 2024-03-07 MED ORDER — LACTATED RINGERS IV SOLN: INTRAVENOUS | Status: DC

## 2024-03-07 MED ORDER — CEFAZOLIN SODIUM-DEXTROSE 2-4 GM/100ML-% IV SOLN
2.0000 g | INTRAVENOUS | Status: AC
  Administered 2024-03-07: 2 g via INTRAVENOUS

## 2024-03-07 MED ORDER — OXYCODONE HCL 5 MG/5ML PO SOLN
ORAL | Status: AC
  Filled 2024-03-07: qty 5

## 2024-03-07 MED ORDER — CHLORHEXIDINE GLUCONATE 0.12 % MT SOLN: 15.0000 mL | Freq: Once | OROMUCOSAL | Status: AC

## 2024-03-07 MED ORDER — ONDANSETRON HCL 4 MG/2ML IJ SOLN
INTRAMUSCULAR | Status: DC | PRN
  Administered 2024-03-07: 4 mg via INTRAVENOUS

## 2024-03-07 SURGICAL SUPPLY — 15 items
BAG DRAIN URO TABLE W/ADPT NS (BAG) ×2 IMPLANT
BAG HAMPER (MISCELLANEOUS) ×2 IMPLANT
BAG URINE DRAIN 2000ML AR STRL (UROLOGICAL SUPPLIES) ×2 IMPLANT
CATH TIEMANN FOLEY 18FR 5CC (CATHETERS) IMPLANT
GLOVE BIO SURGEON STRL SZ8 (GLOVE) ×2 IMPLANT
GLOVE BIOGEL PI IND STRL 7.0 (GLOVE) ×4 IMPLANT
GOWN STRL REUS W/TWL LRG LVL3 (GOWN DISPOSABLE) ×2 IMPLANT
GOWN STRL REUS W/TWL XL LVL3 (GOWN DISPOSABLE) ×2 IMPLANT
KIT TURNOVER CYSTO (KITS) ×2 IMPLANT
PACK CYSTO (CUSTOM PROCEDURE TRAY) ×2 IMPLANT
PAD ARMBOARD POSITIONER FOAM (MISCELLANEOUS) ×2 IMPLANT
POSITIONER HEAD 8X9X4 ADT (SOFTGOODS) ×2 IMPLANT
SOL .9 NS 3000ML IRR UROMATIC (IV SOLUTION) ×2 IMPLANT
SYRINGE TOOMEY IRRIG 70ML (MISCELLANEOUS) IMPLANT
WATER STERILE IRR 500ML POUR (IV SOLUTION) ×2 IMPLANT

## 2024-03-07 NOTE — H&P (Signed)
 History of Present Illness: Clayton Austin is a 83yo with a history of urethral stricture disease, BPH, bladder calculi who is here for cystolithalopaxy. He is a poor historian but he notes new onset urinary urgency, frequency, dysuira for 1-2 days. He has not passed any bladder calculi recently. He previously underwent cystolithalopaxy in 2023 and was found to have a large urethral diverticulum filled with stone.      Past Medical History:  Diagnosis Date   Benign prostatic hypertrophy     BPH (benign prostatic hypertrophy)     Coronary artery disease cardiologist-  dr charls    a. NSTEMI 07/2012 => critical LM disease on LHC => s/p CABG (L-LAD/D1, S-OM2, S-PDA/PL);  EF 55-60% at [facility] 07/2012   Exercise tolerance test normal      Dec 2014  per dr charls (cardiologist) note [facility] treadmilll score 5.5 (low risk)   GERD (gastroesophageal reflux disease)     History of atrial fibrillation      post-op  CABG 04/ 2014   History of non-ST elevation myocardial infarction (NSTEMI)      04/ 2014   Hyperlipidemia     Hypertension     Hypothyroidism     Ischemic heart disease      STABLE PER DR CHARLS (CARDIOLOGIST) NOTE 06-18-2015   Lower urinary tract symptoms (LUTS)     RBBB (right bundle branch block)     S/P CABG x 5      08-17-2012   LIMA sequentilly to LAD and Diagonal,  SVG to OM,  SVG seq to PDA and PLB with EVH from right thigh   Urethral stricture      recurrent   Wears dentures      UPPER   Wears glasses               Past Surgical History:  Procedure Laterality Date   CORONARY ARTERY BYPASS GRAFT N/A 08/17/2012    Procedure: CORONARY ARTERY BYPASS GRAFTING (CABG);  Surgeon: Elspeth JAYSON Millers, MD;  Location: [facility] OR;  Service: Open Heart Surgery;  Laterality: N/A;  L-LAD/D1, S-OM2,  S-PDA/PL   CYSTOSCOPY N/A 08/17/2012    Procedure: CYSTOSCOPY FLEXIBLE for difficult  foley catheter insertion;  Surgeon: Ricardo Likens, MD;  Location: [facility] OR;  Service: Urology;  Laterality:  N/A;   CYSTOSCOPY N/A 01/16/2016    Procedure: CYSTOSCOPY FLEXIBLE;  Surgeon: Ricardo Likens, MD;  Location: [facility];  Service: Urology;  Laterality: N/A;   CYSTOSCOPY WITH LITHOLAPAXY N/A 10/31/2021    Procedure: CYSTOSCOPY WITH LITHOLAPAXY;  Surgeon: Sherrilee Belvie CROME, MD;  Location: [facility];  Service: Urology;  Laterality: N/A;   CYSTOSCOPY WITH URETHRAL DILATATION N/A 01/16/2016    Procedure: CYSTOSCOPY WITH URETHRAL BALLOON DILATATION;  Surgeon: Ricardo Likens, MD;  Location: [facility];  Service: Urology;  Laterality: N/A;   LEFT HEART CATHETERIZATION WITH CORONARY ANGIOGRAM N/A 08/16/2012    Procedure: LEFT HEART CATHETERIZATION WITH CORONARY ANGIOGRAM;  Surgeon: Ozell Fell, MD;  Location: [facility] CATH LAB;  Service: Cardiovascular;  Laterality: N/A;  NSTEMI;  critical LM stenosis extending into the ostial LAD & LCFX,  moderately tight RCA stenosis,  Normal LVF, ef 55-60%   TRANSURETHRAL RESECTION OF PROSTATE   06/2012          Medications: I have reviewed the patient's current medications. Allergies:  Allergies  No Known Allergies          Family History  Problem Relation Age of Onset   Coronary artery  disease Father 54        Social History:  reports that he quit smoking about 53 years ago. His smoking use included cigarettes. He started smoking about 63 years ago. He has a 5 pack-year smoking history. He has never used smokeless tobacco. He reports that he does not drink alcohol  and does not use drugs.   Review of Systems  Genitourinary:  Positive for difficulty urinating and hematuria.  All other systems reviewed and are negative.    Physical Exam:  Vital signs in last 24 hours: Temp:  [97.9 F (36.6 C)-98.4 F (36.9 C)] 98 F (36.7 C) (09/07 0519) Pulse Rate:  [75-79] 79 (09/06 1911) Resp:  [10-17] 12 (09/07 0519) BP: (95-130)/(45-78) 96/51 (09/07 0519) SpO2:  [96 %-100 %] 96 % (09/07 0519) Physical Exam Vitals and nursing note  reviewed.  Constitutional:      Appearance: Normal appearance.  HENT:     Head: Normocephalic and atraumatic.     Nose: Nose normal.     Mouth/Throat:     Mouth: Mucous membranes are dry.  Eyes:     Extraocular Movements: Extraocular movements intact.     Pupils: Pupils are equal, round, and reactive to light.  Cardiovascular:     Rate and Rhythm: Normal rate and regular rhythm.  Pulmonary:     Effort: Pulmonary effort is normal. No respiratory distress.  Abdominal:     General: Abdomen is flat. There is distension.  Genitourinary:    Penis: Normal.      Testes: Normal.  Musculoskeletal:        General: No swelling. Normal range of motion.     Cervical back: Normal range of motion and neck supple.  Skin:    General: Skin is warm and dry.  Neurological:     General: No focal deficit present.     Mental Status: He is alert. He is disoriented.       Laboratory Data:  Lab Results Past 72 Hours        Results for orders placed or performed during the hospital encounter of 12/25/23 (from the past 72 hours)  Urinalysis, Routine w reflex microscopic -Urine, Catheterized     Status: Abnormal    Collection Time: 12/25/23  1:49 PM  Result Value Ref Range    Color, Urine RED (A) YELLOW      Comment: BIOCHEMICALS MAY BE AFFECTED BY COLOR    APPearance CLOUDY (A) CLEAR    Specific Gravity, Urine 1.016 1.005 - 1.030    pH 6.0 5.0 - 8.0    Glucose, UA NEGATIVE NEGATIVE mg/dL    Hgb urine dipstick MODERATE (A) NEGATIVE    Bilirubin Urine NEGATIVE NEGATIVE    Ketones, ur NEGATIVE NEGATIVE mg/dL    Protein, ur 899 (A) NEGATIVE mg/dL    Nitrite NEGATIVE NEGATIVE    Leukocytes,Ua NEGATIVE NEGATIVE    RBC / HPF >50 0 - 5 RBC/hpf    WBC, UA >50 0 - 5 WBC/hpf    Bacteria, UA NONE SEEN NONE SEEN    Squamous Epithelial / HPF 0-5 0 - 5 /HPF    WBC Clumps PRESENT        Comment: Performed at [facility], [address], [place], [place] [address]  Urine Culture     Status: None     Collection Time: 12/25/23  1:49 PM    Specimen: Urine, Clean Catch  Result Value Ref Range    Specimen Description  URINE, CLEAN CATCH Performed at [facility], [address] [place], [place] [address]      Special Requests          NONE Performed at [facility], [address] [place], [place] [address]      Culture          NO GROWTH Performed at [facility], [address] [place], [place] [address]      Report Status 12/26/2023 FINAL    Comprehensive metabolic panel     Status: Abnormal    Collection Time: 12/25/23  3:53 PM  Result Value Ref Range    Sodium 132 (L) 135 - 145 mmol/L    Potassium 4.8 3.5 - 5.1 mmol/L    Chloride 98 98 - 111 mmol/L    CO2 17 (L) 22 - 32 mmol/L    Glucose, Bld 113 (H) 70 - 99 mg/dL      Comment: Glucose reference range applies only to samples taken after fasting for at least 8 hours.    BUN 48 (H) 8 - 23 mg/dL    Creatinine, Ser 7.17 (H) 0.61 - 1.24 mg/dL    Calcium  9.0 8.9 - 10.3 mg/dL    Total Protein 6.7 6.5 - 8.1 g/dL    Albumin  3.4 (L) 3.5 - 5.0 g/dL    AST 36 15 - 41 U/L    ALT 16 0 - 44 U/L    Alkaline Phosphatase 61 38 - 126 U/L    Total Bilirubin 0.9 0.0 - 1.2 mg/dL    GFR, Estimated 22 (L) >60 mL/min      Comment: (NOTE) Calculated using the CKD-EPI Creatinine Equation (2021)      Anion gap 17 (H) 5 - 15      Comment: Performed at [facility], [address] [place], [place] [address]  Lactic acid, plasma     Status: Abnormal    Collection Time: 12/25/23  3:53 PM  Result Value Ref Range    Lactic Acid, Venous 4.7 (HH) 0.5 - 1.9 mmol/L      Comment: CRITICAL RESULT CALLED TO, READ BACK BY AND VERIFIED WITH JORCE A @ 1628 ON [id number] BY HENDERSON L Performed at [facility], [address], [place], [place] [address]    Troponin I (High Sensitivity)     Status: Abnormal    Collection Time: 12/25/23  3:53 PM  Result Value Ref Range    Troponin I (High Sensitivity) 87 (H) <18 ng/L      Comment:  (NOTE) Elevated high sensitivity troponin I (hsTnI) values and significant  changes across serial measurements may suggest ACS but many other  chronic and acute conditions are known to elevate hsTnI results.  Refer to the Links section for chest pain algorithms and additional  guidance. Performed at [facility], [address], [place], [place] [address]    CBC with Differential     Status: Abnormal    Collection Time: 12/25/23  3:53 PM  Result Value Ref Range    WBC 12.0 (H) 4.0 - 10.5 K/uL    RBC 2.00 (L) 4.22 - 5.81 MIL/uL    Hemoglobin 6.1 (LL) 13.0 - 17.0 g/dL      Comment: REPEATED TO VERIFY This critical result has been called to C. BERRIER by Earnie Sor on 12/25/2023 16:16:15, and has been read back. CALLED BY T. HAMER.      HCT 19.9 (L) 39.0 - 52.0 %    MCV 99.5 80.0 - 100.0  fL    MCH 30.5 26.0 - 34.0 pg    MCHC 30.7 30.0 - 36.0 g/dL    RDW 86.5 88.4 - 84.4 %    Platelets 268 150 - 400 K/uL    nRBC 0.0 0.0 - 0.2 %    Neutrophils Relative % 80 %    Neutro Abs 9.6 (H) 1.7 - 7.7 K/uL    Lymphocytes Relative 11 %    Lymphs Abs 1.3 0.7 - 4.0 K/uL    Monocytes Relative 9 %    Monocytes Absolute 1.1 (H) 0.1 - 1.0 K/uL    Eosinophils Relative 0 %    Eosinophils Absolute 0.0 0.0 - 0.5 K/uL    Basophils Relative 0 %    Basophils Absolute 0.0 0.0 - 0.1 K/uL    Immature Granulocytes 0 %    Abs Immature Granulocytes 0.04 0.00 - 0.07 K/uL      Comment: Performed at [facility], [address], [place], [place] [address]  Magnesium      Status: None    Collection Time: 12/25/23  3:53 PM  Result Value Ref Range    Magnesium  2.1 1.7 - 2.4 mg/dL      Comment: Performed at [facility], [address], [place], [place] [address]  POC occult blood, ED     Status: Abnormal    Collection Time: 12/25/23  4:34 PM  Result Value Ref Range    Negative NEG      Negative      Lactic acid, plasma     Status: Abnormal    Collection Time: 12/25/23  5:16 PM  Result Value Ref Range     Lactic Acid, Venous 4.0 (HH) 0.5 - 1.9 mmol/L      Comment: CRITICAL VALUE NOTED. VALUE IS CONSISTENT WITH PREVIOUSLY REPORTED/CALLED VALUE Performed at [facility], [address] [place], [place] [address]    Troponin I (High Sensitivity)     Status: Abnormal    Collection Time: 12/25/23  5:16 PM  Result Value Ref Range    Troponin I (High Sensitivity) 96 (H) <18 ng/L      Comment: (NOTE) Elevated high sensitivity troponin I (hsTnI) values and significant  changes across serial measurements may suggest ACS but many other  chronic and acute conditions are known to elevate hsTnI results.  Refer to the Links section for chest pain algorithms and additional  guidance. Performed at [facility], [address] [place], [place] [address]    Type and screen [facility]     Status: None (Preliminary result)    Collection Time: 12/25/23  5:16 PM  Result Value Ref Range    ABO/RH(D) B POS      Antibody Screen NEG      Sample Expiration 12/28/2023,2359      Unit Number [id number]      Blood Component Type RED CELLS,LR      Unit division 00      Status of Unit ISSUED,FINAL      Transfusion Status OK TO TRANSFUSE      Crossmatch Result Compatible      Unit Number [id number]      Blood Component Type RED CELLS,LR      Unit division 00      Status of Unit ALLOCATED      Transfusion Status OK TO TRANSFUSE      Crossmatch Result          Compatible Performed at [facility], [address], [place], [place]  [address]      Unit Number [id number]      Blood Component Type RED CELLS,LR      Unit division 00      Status of Unit ALLOCATED      Transfusion Status OK TO TRANSFUSE      Crossmatch Result Compatible    Prepare RBC (crossmatch)     Status: None    Collection Time: 12/25/23  5:16 PM  Result Value Ref Range    Order Confirmation          ORDER PROCESSED BY BLOOD BANK Performed at [facility], [address], [place], [place] [address]    Lactic acid, plasma      Status: Abnormal    Collection Time: 12/25/23  7:41 PM  Result Value Ref Range    Lactic Acid, Venous 2.3 (HH) 0.5 - 1.9 mmol/L      Comment: CRITICAL RESULT CALLED TO, READ BACK BY AND VERIFIED WITH NICHOLS,K ON 12/25/23 AT 2006 BY PURDIE,J Performed at [facility], [address] [place], [place] [address]    Basic metabolic panel     Status: Abnormal    Collection Time: 12/25/23  7:41 PM  Result Value Ref Range    Sodium 131 (L) 135 - 145 mmol/L    Potassium 4.3 3.5 - 5.1 mmol/L    Chloride 100 98 - 111 mmol/L    CO2 17 (L) 22 - 32 mmol/L    Glucose, Bld 100 (H) 70 - 99 mg/dL      Comment: Glucose reference range applies only to samples taken after fasting for at least 8 hours.    BUN 43 (H) 8 - 23 mg/dL    Creatinine, Ser 7.59 (H) 0.61 - 1.24 mg/dL    Calcium  8.5 (L) 8.9 - 10.3 mg/dL    GFR, Estimated 26 (L) >60 mL/min      Comment: (NOTE) Calculated using the CKD-EPI Creatinine Equation (2021)      Anion gap 14 5 - 15      Comment: Performed at [facility], [address], [place], [place] [address]  Hemoglobin and hematocrit, blood     Status: Abnormal    Collection Time: 12/26/23 12:36 AM  Result Value Ref Range    Hemoglobin 8.2 (L) 13.0 - 17.0 g/dL      Comment: REPEATED TO VERIFY POST TRANSFUSION SPECIMEN      HCT 24.1 (L) 39.0 - 52.0 %      Comment: Performed at [facility], [address] [place], [place] [address]  Lactic acid, plasma     Status: None    Collection Time: 12/26/23 12:36 AM  Result Value Ref Range    Lactic Acid, Venous 1.6 0.5 - 1.9 mmol/L      Comment: Performed at [facility], [address] [place], [place] [address]  CBC     Status: Abnormal    Collection Time: 12/26/23  4:56 AM  Result Value Ref Range    WBC 11.1 (H) 4.0 - 10.5 K/uL    RBC 2.40 (L) 4.22 - 5.81 MIL/uL    Hemoglobin 7.6 (L) 13.0 - 17.0 g/dL    HCT 77.8 (L) 60.9 - 52.0 %    MCV 92.1 80.0 - 100.0 fL    MCH 31.7 26.0 - 34.0 pg    MCHC 34.4 30.0 - 36.0 g/dL    RDW 86.1  88.4 - 84.4 %    Platelets 239 150 - 400 K/uL    nRBC 0.0  0.0 - 0.2 %      Comment: Performed at [facility], [address], [place], [place] [address]  Basic metabolic panel     Status: Abnormal    Collection Time: 12/26/23  4:56 AM  Result Value Ref Range    Sodium 134 (L) 135 - 145 mmol/L    Potassium 4.6 3.5 - 5.1 mmol/L    Chloride 104 98 - 111 mmol/L    CO2 23 22 - 32 mmol/L    Glucose, Bld 102 (H) 70 - 99 mg/dL      Comment: Glucose reference range applies only to samples taken after fasting for at least 8 hours.    BUN 38 (H) 8 - 23 mg/dL    Creatinine, Ser 7.98 (H) 0.61 - 1.24 mg/dL    Calcium  8.5 (L) 8.9 - 10.3 mg/dL    GFR, Estimated 32 (L) >60 mL/min      Comment: (NOTE) Calculated using the CKD-EPI Creatinine Equation (2021)      Anion gap 7 5 - 15      Comment: Performed at [facility], [address], [place], [place] [address]  CBC     Status: Abnormal    Collection Time: 12/27/23  4:23 AM  Result Value Ref Range    WBC 12.5 (H) 4.0 - 10.5 K/uL    RBC 2.15 (L) 4.22 - 5.81 MIL/uL    Hemoglobin 6.9 (LL) 13.0 - 17.0 g/dL      Comment: REPEATED TO VERIFY This critical result has been called to [facility] @ 0530 ON 12/27/23 by [place] on 12/27/2023 05:29:39, and has been read back.      HCT 20.4 (L) 39.0 - 52.0 %    MCV 94.9 80.0 - 100.0 fL    MCH 32.1 26.0 - 34.0 pg    MCHC 33.8 30.0 - 36.0 g/dL    RDW 85.8 88.4 - 84.4 %    Platelets 239 150 - 400 K/uL    nRBC 0.0 0.0 - 0.2 %      Comment: Performed at [facility], [address], [place], [place] [address]  Comprehensive metabolic panel     Status: Abnormal    Collection Time: 12/27/23  4:23 AM  Result Value Ref Range    Sodium 135 135 - 145 mmol/L    Potassium 4.1 3.5 - 5.1 mmol/L    Chloride 106 98 - 111 mmol/L    CO2 24 22 - 32 mmol/L    Glucose, Bld 108 (H) 70 - 99 mg/dL      Comment: Glucose reference range applies only to samples taken after fasting for at least 8 hours.    BUN 26 (H) 8 - 23 mg/dL     Creatinine, Ser 8.58 (H) 0.61 - 1.24 mg/dL    Calcium  8.1 (L) 8.9 - 10.3 mg/dL    Total Protein 5.1 (L) 6.5 - 8.1 g/dL    Albumin  2.4 (L) 3.5 - 5.0 g/dL    AST 23 15 - 41 U/L    ALT 14 0 - 44 U/L    Alkaline Phosphatase 51 38 - 126 U/L    Total Bilirubin 0.8 0.0 - 1.2 mg/dL    GFR, Estimated 49 (L) >60 mL/min      Comment: (NOTE) Calculated using the CKD-EPI Creatinine Equation (2021)      Anion gap 5 5 - 15      Comment: Performed at [facility], [address], [place], [place] [address]  Hemoglobin and  hematocrit, blood     Status: Abnormal    Collection Time: 12/27/23  6:51 AM  Result Value Ref Range    Hemoglobin 6.9 (LL) 13.0 - 17.0 g/dL      Comment: REPEATED TO VERIFY This critical result has been called to [facility] by Rutherford Sauers on 12/27/2023 07:40:19, and has been read back.      HCT 20.6 (L) 39.0 - 52.0 %      Comment: Performed at [facility], [address] [place], [place] [address]  Prepare RBC (crossmatch)     Status: None    Collection Time: 12/27/23  7:46 AM  Result Value Ref Range    Order Confirmation          ORDER PROCESSED BY BLOOD BANK Performed at [facility], [address] [place], [place] [address]               Recent Results (from the past 240 hours)  Urine Culture     Status: None    Collection Time: 12/25/23  1:49 PM    Specimen: Urine, Clean Catch  Result Value Ref Range Status    Specimen Description     Final      URINE, CLEAN CATCH Performed at [facility], [address] [place], [place] [address]      Special Requests     Final      NONE Performed at [facility], [address], [place], [place] [address]      Culture     Final      NO GROWTH Performed at [facility], [address], [place], [place] [address]      Report Status 12/26/2023 FINAL   Final    Creatinine:       Recent Labs    12/25/23 1553 12/25/23 1941 12/26/23 0456 12/27/23 0423  CREATININE 2.82* 2.40* 2.01* 1.41*    Baseline  Creatinine: 1.2          Impression/Assessment:  83yo with urinary retention, bladder calculi   Plan:  The risks/benefits/alternatives to cystolithalopaxy was explained to the patient and he understands and wishes to proceed with surgery

## 2024-03-07 NOTE — Anesthesia Postprocedure Evaluation (Signed)
 Anesthesia Post Note  Patient: Clayton Austin  Procedure(s) Performed: CYSTOSCOPY, WITH BLADDER CALCULUS LITHOLAPAXY (Bladder)  Patient location during evaluation: PACU Anesthesia Type: General Level of consciousness: awake and alert Pain management: pain level controlled Vital Signs Assessment: post-procedure vital signs reviewed and stable Respiratory status: spontaneous breathing, nonlabored ventilation, respiratory function stable and patient connected to nasal cannula oxygen  Cardiovascular status: blood pressure returned to baseline and stable Postop Assessment: no apparent nausea or vomiting Anesthetic complications: no   There were no known notable events for this encounter.   Last Vitals:  Vitals:   03/07/24 1345 03/07/24 1353  BP: (!) 149/61   Pulse: (!) 54 (!) 55  Resp: 20 16  Temp: 36.4 C   SpO2: 100% 100%    Last Pain:  Vitals:   03/07/24 1353  PainSc: 7                  Binnie Vonderhaar L Avaneesh Pepitone

## 2024-03-07 NOTE — Progress Notes (Signed)
 Dr. Sherrilee notified that patients wife is unsure if patient stopped Eliquis.  He states okay to proceed with procedure.

## 2024-03-07 NOTE — Op Note (Signed)
 Preoperative diagnosis: neurogenic bladder, bladder calculus  Postoperative diagnosis: same  Procedure: 1 cystoscopy 2. cystolithalopaxy for a stone over 2.5cm  Attending: Belvie Clara  Anesthesia: General  Estimated blood loss: Minimal  Drains: 18 french foley  Specimens: 1. Bladder calculus  Antibiotics: ancef   Findings:  Ureteral orifices in normal anatomic location.  5cm bladder calculus  Indications: Patient is a 83 year old male with a history of neurogenic bladder and bladder calculi.  After discussing treatment options, they decided proceed with cystolithalopaxy.  Procedure in detail: The patient was brought to the operating room and a brief timeout was done to ensure correct patient, correct procedure, correct site.  General anesthesia was administered patient was placed in dorsal lithotomy position.  Their genitalia was then prepped and draped in usual sterile fashion.  A rigid 22 French cystoscope was passed in the urethra and the bladder.  Bladder was inspected and we noted no masses or lesions.  the ureteral orifices were in the normal orthotopic locations. The stone was fragmented with a stone breaker and the fragments were irrigated from the bladder. The stone fragments were the sent for analysis. The bladder was then drained and this concluded the procedure which was well tolerated by patient.  Complications: None  Condition: Stable, extubated, transferred to PACU  Plan: Patient is to be discharge home. He will followup in 1 week for a voiding trial

## 2024-03-07 NOTE — Transfer of Care (Addendum)
 Immediate Anesthesia Transfer of Care Note  Patient: Clayton Austin  Procedure(s) Performed: CYSTOSCOPY, WITH BLADDER CALCULUS LITHOLAPAXY (Bladder)  Patient Location: PACU  Anesthesia Type:General  Level of Consciousness: drowsy and patient cooperative  Airway & Oxygen  Therapy: Patient Spontanous Breathing and Patient connected to face mask oxygen   Post-op Assessment: Report given to RN and Post -op Vital signs reviewed and stable  Post vital signs: Reviewed and stable  Last Vitals:  Vitals Value Taken Time  BP 80/45 03/07/24 12:31  Temp 36.8 03/07/24   12:31  Pulse 50 03/07/24 12:33  Resp 10 03/07/24 12:33  SpO2 100 % 03/07/24 12:33  Vitals shown include unfiled device data.  Last Pain: There were no vitals filed for this visit.    Patients Stated Pain Goal: 2 (03/07/24 1119)  Complications: No notable events documented.

## 2024-03-07 NOTE — Progress Notes (Signed)
 Foley bag changed due to clots trapping outflow.  Patient's wife instructed on how to change bag.  Extra bag given to patient.  RN instructed patient and family to call MD or report to emergency room if foley stops draining or any other medical emergency.

## 2024-03-07 NOTE — Addendum Note (Signed)
 Addendum  created 03/07/24 1411 by Para Jerelene CROME, CRNA   Flowsheet accepted

## 2024-03-07 NOTE — Anesthesia Preprocedure Evaluation (Addendum)
 Anesthesia Evaluation  Patient identified by MRN, date of birth, ID band Patient awake    Reviewed: Allergy & Precautions, NPO status , Patient's Chart, lab work & pertinent test results  Airway Mallampati: II  TM Distance: >3 FB Neck ROM: Full    Dental  (+) Partial Upper, Dental Advisory Given,    Pulmonary former smoker   Pulmonary exam normal breath sounds clear to auscultation       Cardiovascular Exercise Tolerance: Good hypertension, + CAD, + Past MI and + CABG  Normal cardiovascular exam+ dysrhythmias (RBBB) Atrial Fibrillation  Rhythm:Regular Rate:Normal  Stress Findings The patient exercised following the Bruce protocol.   The patient reported shortness of breath during the stress test. The patient experienced no angina during the stress test.   The test was stopped because  the patient complained of fatigue and shortness of breath. Test was stopped per protocol.    Heart rate demonstrated a normal response to exercise. Blood pressure demonstrated a hypertensive response to exercise. Overall, the patient's exercise capacity was normal.   85% of maximum heart rate was achieved after 1.3 minutes.  Recovery time:  6.5 minutes.  The patient's response to exercise was adequate for diagnosis.     Neuro/Psych Carotid stenosis negative neurological ROS  negative psych ROS   GI/Hepatic Neg liver ROS,GERD  Medicated,,  Endo/Other  Hypothyroidism    Renal/GU Renal InsufficiencyRenal diseaseRenal insufficiency much improved according to his labs  negative genitourinary   Musculoskeletal negative musculoskeletal ROS (+)    Abdominal   Peds negative pediatric ROS (+)  Hematology  (+) Blood dyscrasia, anemia   Anesthesia Other Findings   Reproductive/Obstetrics negative OB ROS                              Anesthesia Physical Anesthesia Plan  ASA: 3  Anesthesia Plan: General   Post-op  Pain Management: Minimal or no pain anticipated   Induction: Intravenous  PONV Risk Score and Plan: Ondansetron  and Dexamethasone   Airway Management Planned: LMA and Oral ETT  Additional Equipment:   Intra-op Plan:   Post-operative Plan: Extubation in OR  Informed Consent: I have reviewed the patients History and Physical, chart, labs and discussed the procedure including the risks, benefits and alternatives for the proposed anesthesia with the patient or authorized representative who has indicated his/her understanding and acceptance.     Dental advisory given and Consent reviewed with POA  Plan Discussed with: CRNA  Anesthesia Plan Comments:          Anesthesia Quick Evaluation

## 2024-03-07 NOTE — Anesthesia Procedure Notes (Signed)
 Procedure Name: LMA Insertion Date/Time: 03/07/2024 11:51 AM  Performed by: Para Jerelene CROME, CRNAPre-anesthesia Checklist: Patient identified, Emergency Drugs available, Suction available and Patient being monitored Patient Re-evaluated:Patient Re-evaluated prior to induction Oxygen  Delivery Method: Circle system utilized Preoxygenation: Pre-oxygenation with 100% oxygen  Induction Type: IV induction Ventilation: Mask ventilation without difficulty LMA: LMA inserted LMA Size: 4.0 Number of attempts: 1 Placement Confirmation: positive ETCO2, CO2 detector and breath sounds checked- equal and bilateral ETT to lip (cm): LMS seated well and secured. No markings on Ambu Aurastraight LMA size 4. Tube secured with: Tape Dental Injury: Teeth and Oropharynx as per pre-operative assessment  Comments: Atraumatic insertion of LMA size 4. Lips and teeth remain in preoperative condition.

## 2024-03-08 ENCOUNTER — Encounter (HOSPITAL_COMMUNITY): Payer: Self-pay | Admitting: Urology

## 2024-03-22 LAB — STONE ANALYSIS
Ammonium Acid Urate Calculi: 10 %
Calcium Oxalate Monohydrate: 10 %
Calcium Phosphate (Carbonate): 40 %
Struvite (MgNH4PO4 6H2O): 40 %
Weight Calculi: 15125 mg

## 2024-03-23 ENCOUNTER — Ambulatory Visit: Admitting: Urology

## 2024-04-13 ENCOUNTER — Telehealth: Payer: Self-pay

## 2024-04-13 NOTE — Telephone Encounter (Signed)
 Open in error

## 2024-04-18 ENCOUNTER — Ambulatory Visit

## 2024-04-18 ENCOUNTER — Telehealth: Payer: Self-pay

## 2024-04-18 DIAGNOSIS — R339 Retention of urine, unspecified: Secondary | ICD-10-CM | POA: Diagnosis not present

## 2024-04-18 DIAGNOSIS — N401 Enlarged prostate with lower urinary tract symptoms: Secondary | ICD-10-CM

## 2024-04-18 MED ORDER — CIPROFLOXACIN HCL 500 MG PO TABS
500.0000 mg | ORAL_TABLET | Freq: Once | ORAL | Status: AC
  Administered 2024-04-18: 500 mg via ORAL

## 2024-04-18 NOTE — Telephone Encounter (Signed)
 Patient came in today for monthly cath change. Pt's wife state's she thought pt was supposed to have cath removed. After looking into patient's chart from previous surgery in November  and DR. McKenzie op note pt was supposed to have 1 week voiding trial. Pt and wife is aware a message will be sent to MD on how to proceed next.

## 2024-04-18 NOTE — Progress Notes (Signed)
 Cath Change/ Replacement  Patient is present today for a catheter change due to urinary retention.  10 ml of water  was removed from the balloon, a 18 coude FR foley cath was removed without difficulty.  Patient was cleaned and prepped in a sterile fashion with Betadinex3.  A 18 coude FR foley cath was replaced into the bladder, no complications were noted. Urine return was noted 10 ml and urine was Bloody in color. The balloon was filled with 10ml of sterile water . A night  bag was attached for drainage.  A night bag was also given to the patient and patient was given instruction on how to change from one bag to another. Patient was given proper instruction on catheter care.    Performed by: Carlos, CMA  Follow up: Pending MD response

## 2024-04-26 NOTE — Telephone Encounter (Signed)
 Return call to pt/wife. Wanting to know when will the patient cath come out. Pt/wife a message will be sent to MD. Voiced understanding.

## 2024-05-03 NOTE — Telephone Encounter (Signed)
 Pt is made aware and voiced understanding He can have a voiding trial

## 2024-05-09 ENCOUNTER — Telehealth: Payer: Self-pay | Admitting: Internal Medicine

## 2024-05-09 NOTE — Telephone Encounter (Signed)
 Pt c/o medication issue:  1. Name of Medication:   apixaban (ELIQUIS) 5 MG TABS tablet   2. How are you currently taking this medication (dosage and times per day)?   3. Are you having a reaction (difficulty breathing--STAT)?   4. What is your medication issue?    Wife Hotel Manager) stated patient has been out of this medication fgor a week.  Wife stated patient cannot afford this medication and want alternate medication or assistance with this medication.

## 2024-05-09 NOTE — Telephone Encounter (Signed)
 Forward to rx med assistance team for review.

## 2024-05-11 ENCOUNTER — Other Ambulatory Visit (HOSPITAL_COMMUNITY): Payer: Self-pay

## 2024-05-12 NOTE — Telephone Encounter (Signed)
 Spoke to patients wife who is asking to switch pt to Coumadin as this would be the most affordable option for them.   Please advise.

## 2024-05-17 ENCOUNTER — Ambulatory Visit

## 2024-05-17 NOTE — Telephone Encounter (Signed)
" °  Lola is calling back to follow up, she said they've been waiting for 2 weeks to get recommendation. She requested if they can get a call back today "

## 2024-05-18 ENCOUNTER — Ambulatory Visit

## 2024-05-18 MED ORDER — WARFARIN SODIUM 3 MG PO TABS: 3.0000 mg | ORAL_TABLET | Freq: Every day | ORAL | 3 refills | Status: AC

## 2024-05-18 NOTE — Telephone Encounter (Signed)
 Spoke to wife who voiced understanding of medication change. Wife stated that they were currently iced in at home, but would go pick medication up as soon as they could. Advised wife to give our office a call when pt starts medication so that we can get him scheduled with Coumadin  Clinic. Wife voiced understanding.

## 2024-05-20 ENCOUNTER — Ambulatory Visit

## 2024-05-27 ENCOUNTER — Telehealth: Payer: Self-pay | Admitting: Urology

## 2024-05-27 NOTE — Telephone Encounter (Signed)
 Patient has cath and wife called wanting appt before May to have it removed. They missed appt due to snow. Call wife [phone]

## 2024-05-27 NOTE — Telephone Encounter (Signed)
 Return call to pt's wife to rescheduled VT due to weather. Wife is made aware of new appointment and time. Voiced understanding

## 2024-06-20 ENCOUNTER — Ambulatory Visit

## 2024-09-02 ENCOUNTER — Ambulatory Visit: Admitting: Urology

## 2024-09-14 ENCOUNTER — Ambulatory Visit: Admitting: Urology
